# Patient Record
Sex: Female | Born: 1970 | Race: White | Hispanic: No | Marital: Married | Smoking: Never smoker
Health system: Southern US, Community
[De-identification: ages and names within clinical notes are randomized; demographics above are authoritative.]

## PROBLEM LIST (undated history)

## (undated) DIAGNOSIS — L309 Dermatitis, unspecified: Secondary | ICD-10-CM

## (undated) DIAGNOSIS — L719 Rosacea, unspecified: Secondary | ICD-10-CM

## (undated) DIAGNOSIS — F419 Anxiety disorder, unspecified: Secondary | ICD-10-CM

## (undated) DIAGNOSIS — U071 COVID-19: Secondary | ICD-10-CM

## (undated) DIAGNOSIS — E669 Obesity, unspecified: Secondary | ICD-10-CM

## (undated) DIAGNOSIS — J3089 Other allergic rhinitis: Secondary | ICD-10-CM

## (undated) DIAGNOSIS — N83201 Unspecified ovarian cyst, right side: Secondary | ICD-10-CM

## (undated) DIAGNOSIS — M1990 Unspecified osteoarthritis, unspecified site: Secondary | ICD-10-CM

## (undated) DIAGNOSIS — M549 Dorsalgia, unspecified: Secondary | ICD-10-CM

## (undated) DIAGNOSIS — R5383 Other fatigue: Secondary | ICD-10-CM

## (undated) DIAGNOSIS — E7800 Pure hypercholesterolemia, unspecified: Secondary | ICD-10-CM

## (undated) DIAGNOSIS — S335XXA Sprain of ligaments of lumbar spine, initial encounter: Secondary | ICD-10-CM

## (undated) DIAGNOSIS — Z8632 Personal history of gestational diabetes: Secondary | ICD-10-CM

## (undated) DIAGNOSIS — Z86718 Personal history of other venous thrombosis and embolism: Secondary | ICD-10-CM

## (undated) DIAGNOSIS — T7840XA Allergy, unspecified, initial encounter: Secondary | ICD-10-CM

## (undated) DIAGNOSIS — E119 Type 2 diabetes mellitus without complications: Secondary | ICD-10-CM

## (undated) DIAGNOSIS — R0602 Shortness of breath: Secondary | ICD-10-CM

## (undated) DIAGNOSIS — J189 Pneumonia, unspecified organism: Secondary | ICD-10-CM

## (undated) DIAGNOSIS — M2550 Pain in unspecified joint: Secondary | ICD-10-CM

## (undated) HISTORY — DX: Pure hypercholesterolemia, unspecified: E78.00

## (undated) HISTORY — DX: Dermatitis, unspecified: L30.9

## (undated) HISTORY — DX: Anxiety disorder, unspecified: F41.9

## (undated) HISTORY — DX: Rosacea, unspecified: L71.9

## (undated) HISTORY — DX: Pneumonia, unspecified organism: J18.9

## (undated) HISTORY — DX: Type 2 diabetes mellitus without complications: E11.9

## (undated) HISTORY — DX: Unspecified osteoarthritis, unspecified site: M19.90

## (undated) HISTORY — DX: Allergy, unspecified, initial encounter: T78.40XA

## (undated) HISTORY — DX: Obesity, unspecified: E66.9

## (undated) HISTORY — DX: Personal history of other venous thrombosis and embolism: Z86.718

## (undated) HISTORY — DX: Other fatigue: R53.83

## (undated) HISTORY — DX: COVID-19: U07.1

## (undated) HISTORY — DX: Pain in unspecified joint: M25.50

## (undated) HISTORY — DX: Shortness of breath: R06.02

## (undated) HISTORY — DX: Dorsalgia, unspecified: M54.9

## (undated) HISTORY — DX: Sprain of ligaments of lumbar spine, initial encounter: S33.5XXA

---

## 1999-08-09 HISTORY — PX: WISDOM TOOTH EXTRACTION: SHX21

## 2001-10-03 ENCOUNTER — Other Ambulatory Visit: Admission: RE | Admit: 2001-10-03 | Discharge: 2001-10-03 | Payer: Self-pay | Admitting: Obstetrics and Gynecology

## 2002-10-14 ENCOUNTER — Other Ambulatory Visit: Admission: RE | Admit: 2002-10-14 | Discharge: 2002-10-14 | Payer: Self-pay | Admitting: Gynecology

## 2003-12-08 ENCOUNTER — Other Ambulatory Visit: Admission: RE | Admit: 2003-12-08 | Discharge: 2003-12-08 | Payer: Self-pay | Admitting: Obstetrics and Gynecology

## 2004-08-27 ENCOUNTER — Other Ambulatory Visit: Admission: RE | Admit: 2004-08-27 | Discharge: 2004-08-27 | Payer: Self-pay | Admitting: Gynecology

## 2004-12-21 ENCOUNTER — Encounter: Admission: RE | Admit: 2004-12-21 | Discharge: 2004-12-21 | Payer: Self-pay | Admitting: Gynecology

## 2005-03-15 ENCOUNTER — Encounter (INDEPENDENT_AMBULATORY_CARE_PROVIDER_SITE_OTHER): Payer: Self-pay | Admitting: Specialist

## 2005-03-15 ENCOUNTER — Inpatient Hospital Stay (HOSPITAL_COMMUNITY): Admission: AD | Admit: 2005-03-15 | Discharge: 2005-03-18 | Payer: Self-pay | Admitting: Gynecology

## 2005-04-06 ENCOUNTER — Ambulatory Visit: Admission: RE | Admit: 2005-04-06 | Discharge: 2005-04-06 | Payer: Self-pay | Admitting: Gynecology

## 2005-04-26 ENCOUNTER — Other Ambulatory Visit: Admission: RE | Admit: 2005-04-26 | Discharge: 2005-04-26 | Payer: Self-pay | Admitting: Gynecology

## 2005-08-04 ENCOUNTER — Ambulatory Visit: Payer: Self-pay | Admitting: Internal Medicine

## 2006-05-03 ENCOUNTER — Other Ambulatory Visit: Admission: RE | Admit: 2006-05-03 | Discharge: 2006-05-03 | Payer: Self-pay | Admitting: Gynecology

## 2006-07-06 ENCOUNTER — Ambulatory Visit: Payer: Self-pay | Admitting: Internal Medicine

## 2006-08-05 ENCOUNTER — Ambulatory Visit: Payer: Self-pay | Admitting: Internal Medicine

## 2007-06-20 ENCOUNTER — Other Ambulatory Visit: Admission: RE | Admit: 2007-06-20 | Discharge: 2007-06-20 | Payer: Self-pay | Admitting: Gynecology

## 2007-06-26 ENCOUNTER — Telehealth: Payer: Self-pay | Admitting: Internal Medicine

## 2007-07-09 ENCOUNTER — Telehealth (INDEPENDENT_AMBULATORY_CARE_PROVIDER_SITE_OTHER): Payer: Self-pay | Admitting: *Deleted

## 2007-12-01 ENCOUNTER — Encounter: Payer: Self-pay | Admitting: *Deleted

## 2007-12-01 DIAGNOSIS — N751 Abscess of Bartholin's gland: Secondary | ICD-10-CM | POA: Insufficient documentation

## 2008-05-19 ENCOUNTER — Inpatient Hospital Stay (HOSPITAL_COMMUNITY): Admission: AD | Admit: 2008-05-19 | Discharge: 2008-05-21 | Payer: Self-pay | Admitting: Obstetrics and Gynecology

## 2008-05-20 ENCOUNTER — Encounter (INDEPENDENT_AMBULATORY_CARE_PROVIDER_SITE_OTHER): Payer: Self-pay | Admitting: Obstetrics and Gynecology

## 2009-07-13 ENCOUNTER — Ambulatory Visit: Payer: Self-pay | Admitting: Gynecology

## 2009-07-13 ENCOUNTER — Other Ambulatory Visit: Admission: RE | Admit: 2009-07-13 | Discharge: 2009-07-13 | Payer: Self-pay | Admitting: Gynecology

## 2009-08-12 ENCOUNTER — Ambulatory Visit: Payer: Self-pay | Admitting: Gynecology

## 2009-08-20 ENCOUNTER — Ambulatory Visit: Payer: Self-pay | Admitting: Internal Medicine

## 2009-08-20 DIAGNOSIS — E785 Hyperlipidemia, unspecified: Secondary | ICD-10-CM | POA: Insufficient documentation

## 2009-08-21 ENCOUNTER — Telehealth (INDEPENDENT_AMBULATORY_CARE_PROVIDER_SITE_OTHER): Payer: Self-pay | Admitting: *Deleted

## 2009-12-05 ENCOUNTER — Ambulatory Visit: Payer: Self-pay | Admitting: Family Medicine

## 2009-12-05 DIAGNOSIS — S335XXA Sprain of ligaments of lumbar spine, initial encounter: Secondary | ICD-10-CM

## 2009-12-05 HISTORY — DX: Sprain of ligaments of lumbar spine, initial encounter: S33.5XXA

## 2010-07-14 ENCOUNTER — Ambulatory Visit: Payer: Self-pay | Admitting: Women's Health

## 2010-07-14 ENCOUNTER — Other Ambulatory Visit
Admission: RE | Admit: 2010-07-14 | Discharge: 2010-07-14 | Payer: Self-pay | Source: Home / Self Care | Admitting: Gynecology

## 2010-09-07 NOTE — Assessment & Plan Note (Signed)
Summary: BACK PAIN/DLO   Vital Signs:  Patient profile:   40 year old female Weight:      171 pounds Temp:     97.9 degrees F oral Pulse rate:   67 / minute BP sitting:   118 / 70  (right arm) Cuff size:   regular  Vitals Entered By: Lamar Sprinkles, CMA (December 05, 2009 9:01 AM) CC: Pt c/o low back pain starting last night after bending. Has taken naproxyn this am.    History of Present Illness: Here with her husband for the sudden onset of low back pain last night. After doing yard work all day yesterday, she bent over to get her 29 year old child out of the bathtub and felt a sudden sharp pain across both sides of the lower back. this has persisted this am, along with stifness over the entire back. No radiation of pain in the legs or numbness or weakness. Using Naproxen. This has never happened before.  Current Medications (verified): 1)  Multivitamins   Tabs (Multiple Vitamin) .... Take Once Daily 2)  Betamethasone Dipropionate 0.05 % Crea (Betamethasone Dipropionate) .... Apply Once Daily To Eczema When Flare  Allergies (verified): No Known Drug Allergies  Past History:  Past Medical History: Reviewed history from 08/20/2009 and no changes required. Hx of BARTHOLIN'S CYST ABSCESS (ICD-616.3) Hx of PLANTAR FASCIITIS, LEFT (ICD-728.71) ECZEMA (ICD-692.9) CHICKENPOX, HX OF (ICD-V15.9)   Physician Roster            gyn - Dr. Audie Box            opthal - [facility]  Past Surgical History: Reviewed history from 08/20/2009 and no changes required. WISDOM TEETH EXTRACTION, HX OF (ICD-V15.9) Caesarean section '06   G2P2 - 1st by c-section, 2nd - SVD  Review of Systems  The patient denies anorexia, fever, weight loss, weight gain, vision loss, decreased hearing, hoarseness, chest pain, syncope, dyspnea on exertion, peripheral edema, prolonged cough, headaches, hemoptysis, abdominal pain, melena, hematochezia, severe indigestion/heartburn, hematuria, incontinence, genital  sores, muscle weakness, suspicious skin lesions, transient blindness, difficulty walking, depression, unusual weight change, abnormal bleeding, enlarged lymph nodes, angioedema, breast masses, and testicular masses.    Physical Exam  General:  in pain but able to get on the exam table  easily Msk:  tender in both side sof the lower back with some spasm, but full ROM and negative SLR   Impression & Recommendations:  Problem # 1:  LUMBAR SPRAIN AND STRAIN (ICD-847.2)  Complete Medication List: 1)  Multivitamins Tabs (Multiple vitamin) .... Take once daily 2)  Betamethasone Dipropionate 0.05 % Crea (Betamethasone dipropionate) .... Apply once daily to eczema when flare 3)  Flexeril 10 Mg Tabs (Cyclobenzaprine hcl) .... Three times a day as needed spasm 4)  Vicodin 5-500 Mg Tabs (Hydrocodone-acetaminophen) .Marland Kitchen.. 1 q 4 hours as needed pain  Patient Instructions: 1)  rest, ice packs. Continue naproxen, add Vicodin and flexeril as needed .  2)  Please schedule a follow-up appointment as needed .  Prescriptions: VICODIN 5-500 MG TABS (HYDROCODONE-ACETAMINOPHEN) 1 q 4 hours as needed pain  #60 x 0   Entered and Authorized by:   Nelwyn Salisbury MD   Signed by:   Nelwyn Salisbury MD on 12/05/2009   Method used:   Print then Give to Patient   RxID:   [id number] FLEXERIL 10 MG TABS (CYCLOBENZAPRINE HCL) three times a day as needed spasm  #60 x 2   Entered and Authorized by:  Nelwyn Salisbury MD   Signed by:   Nelwyn Salisbury MD on 12/05/2009   Method used:   Print then Give to Patient   RxID:   [id number]

## 2010-09-07 NOTE — Assessment & Plan Note (Signed)
Summary: RE ESTABLISH VISIT--[employer]--[facility]   Vital Signs:  Patient profile:   40 year old female Height:      66 inches Weight:      174 pounds BMI:     28.19 O2 Sat:      97 % on Room air Temp:     98.4 degrees F oral Pulse rate:   56 / minute BP sitting:   138 / 52  (left arm) Cuff size:   regular  Vitals Entered By: Bill Salinas CMA (August 20, 2009 1:23 PM)  O2 Flow:  Room air CC: office visit to re establish care with Dr Norins/ pt is due for her tetanus booster/ ab   Primary Care Provider:  Jacques Navy MD  CC:  office visit to re establish care with Dr Norins/ pt is due for her tetanus booster/ ab.  History of Present Illness: Patient presents to reestablish for on-going continuity care. she has had two children and is now done. she is feeling well but has had her lipids checked with an LDL of 140 non-fasting and subsequently (after the holidays) 167 with healthy HDLs. she does have a positive family history of hyperlipidemia. Her plan is the LSM approach. She is provided reference to [employer]. gov and NCEP guidelines.   Derm issues: has many skin tags, some of which get irritated; she gets an eczema of the hands/palms with weather, dryness, etc. She has a collection of  moles. Small keratin papules bridge of nose and medial aspect nose by the canthus right.   Current with gynecologist.   Current Medications (verified): 1)  Multivitamins   Tabs (Multiple Vitamin) .... Take Once Daily  Allergies (verified): No Known Drug Allergies  Past History:  Family History: Last updated: 08/20/2009 Father - 1944: CAD/MI @ 82,  CABG 4 vessel;h/o V-tach - AICD; Lipids, HTN Mother - 1944: Lipids Neg- breast or colon cancer; DM  Social History: Last updated: 08/20/2009 [employer] - BA; [employer] school Married - '03 2 sons - '06, '09 Homemaker marriage in good health   Risk Factors: Smoking Status: never (12/01/2007)  Past Medical History: Hx of  BARTHOLIN'S CYST ABSCESS (ICD-616.3) Hx of PLANTAR FASCIITIS, LEFT (ICD-728.71) ECZEMA (ICD-692.9) CHICKENPOX, HX OF (ICD-V15.9)   Physician Roster            gyn - Dr. Audie Box            opthal - [facility]  Past Surgical History: WISDOM TEETH EXTRACTION, HX OF (ICD-V15.9) Caesarean section '06   G2P2 - 1st by c-section, 2nd - SVD  Family History: Father - 1944: CAD/MI @ 16,  CABG 4 vessel;h/o V-tach - AICD; Lipids, HTN Mother - 1944: Lipids Neg- breast or colon cancer; DM  Social History: [employer] - BA; [employer] school Married - '03 2 sons - '06, '09 Homemaker marriage in good health   Review of Systems  The patient denies anorexia, fever, weight loss, weight gain, vision loss, decreased hearing, hoarseness, chest pain, syncope, dyspnea on exertion, peripheral edema, prolonged cough, headaches, hemoptysis, abdominal pain, melena, hematochezia, severe indigestion/heartburn, hematuria, incontinence, muscle weakness, suspicious skin lesions, transient blindness, difficulty walking, depression, unusual weight change, enlarged lymph nodes, angioedema, and breast masses.    Physical Exam  General:  Well-developed,well-nourished,in no acute distress; alert,appropriate and cooperative throughout examination Head:  Normocephalic and atraumatic without obvious abnormalities. No apparent alopecia or balding. Eyes:  No corneal or conjunctival inflammation noted. EOMI. Perrla. Funduscopic exam benign, without hemorrhages,  exudates or papilledema. Vision grossly normal. Ears:  Right EAC normal , TM normal; left EAC with cerumen Mouth:  Oral mucosa and oropharynx without lesions or exudates.  Teeth in good repair. Neck:  No deformities, masses, or tenderness noted.no thyromegaly and no carotid bruits.   Chest Wall:  no deformities and no tenderness.   Breasts:  deferred to gyn Lungs:  Normal respiratory effort, chest expands symmetrically. Lungs are clear to  auscultation, no crackles or wheezes. Heart:  Normal rate and regular rhythm. S1 and S2 normal without gallop, murmur, click, rub or other extra sounds. Abdomen:  soft, non-tender, and normal bowel sounds.   Genitalia:  deferred to gyn Msk:  normal ROM, no joint tenderness, no joint swelling, and no joint warmth.   Pulses:  2+ radial Extremities:  No clubbing, cyanosis, edema, or deformity noted with normal full range of motion of all joints.   Neurologic:  No cranial nerve deficits noted. Station and gait are normal. Plantar reflexes are down-going bilaterally. DTRs are symmetrical throughout. Sensory, motor and coordinative functions appear intact. Skin:  turgor normal and color normal.  Minor eczema on palm of left hand. several skin tags around the neck. Benign appearing mole mid back. Cervical Nodes:  no anterior cervical adenopathy and no posterior cervical adenopathy.   Psych:  Oriented X3, memory intact for recent and remote, normally interactive, and good eye contact.     Impression & Recommendations:  Problem # 1:  ECZEMA (ICD-692.9) Chronic recurrent intermittent problem  Plan - betamethasone as needed.  Her updated medication list for this problem includes:    Betamethasone Dipropionate 0.05 % Crea (Betamethasone dipropionate) .Marland Kitchen... Apply once daily to eczema when flare  Problem # 2:  SKIN TAG (ICD-701.9) Several minor skin tags, including in a necklace location.  Plan - removal of skin tags at her convenience.  Problem # 3:  HYPERLIPIDEMIA (ICD-272.4) Outside lab: LDL 167, HDL ok.  Plan - life-style management with low fat diet and exercise           Lipid profile in 4 months - goal of LDL 130 or less.  Problem # 4:  Preventive Health Care (ICD-V70.0) Normal history, normal limited physical exam. Outside labs with elevated cholesterol as noted.   In summary - a very nice woman who appears to be in good health except for elevated choleterol which she is addressing.  She will return for lab as noted, otherwise in 18-25 months or as needed.   Complete Medication List: 1)  Multivitamins Tabs (Multiple vitamin) .... Take once daily 2)  Betamethasone Dipropionate 0.05 % Crea (Betamethasone dipropionate) .... Apply once daily to eczema when flare Prescriptions: BETAMETHASONE DIPROPIONATE 0.05 % CREA (BETAMETHASONE DIPROPIONATE) apply once daily to eczema when flare  #45g x 2   Entered and Authorized by:   Jacques Navy MD   Signed by:   Jacques Navy MD on 08/21/2009   Method used:   Handwritten   RxID:   [id number]

## 2010-09-07 NOTE — Progress Notes (Signed)
Summary: lab order in [facility] for May 2011.  ---- Converted from flag ---- ---- 08/21/2009 6:07 AM, Jacques Navy MD wrote: Peri Jefferson morning,  Please - lipid panel in 4 months - 272.4  Thanks ------------------------------

## 2010-12-24 NOTE — Discharge Summary (Signed)
NAMEMELI, FALEY                ACCOUNT NO.:  [id number]   MEDICAL RECORD NO.:  [id number]          PATIENT TYPE:  INP   LOCATION:  9125                          [facility]ACILITY:  [facility]   PHYSICIAN:  Timothy P. Fontaine, M.D.DATE OF BIRTH:  12/22/1970   DATE OF ADMISSION:  03/15/2005  DATE OF DISCHARGE:                                 DISCHARGE SUMMARY   DISCHARGE DIAGNOSES:  1.  Term pregnancy.  2.  Gestational diabetes, diet controlled.  3.  Labor.  4.  Breech presentation.   PROCEDURE:  Primary low transverse cervical cesarean section - March 15, 2005.   HOSPITAL COURSE:  A 40 year old G1 P0 female who presents at term in labor.  Evaluation revealed she was breech presentation and after appropriate  counseling was taken for a primary low transverse cervical cesarean section  in which she produced a normal-appearing female infant, Apgars 8 and 9 in the  frank breech presentation, weight 9 pounds 13 ounces. The patient's  postoperative course was uncomplicated. She was discharged on postoperative  day #3 ambulating well, tolerating a regular diet, with a postoperative  hemoglobin of 12.4. She received precautions, instructions, and follow-up,  will be seen in the office in 6 weeks. Received a prescription for Percocet  #40 one to two p.o. q.6h. p.r.n. pain with a maternal blood type of B  positive and a rubella titer that is positive.      Timothy P. Fontaine, M.D.  Electronically Signed     TPF/MEDQ  D:  03/18/2005  T:  03/18/2005  Job:  [id number]

## 2010-12-24 NOTE — Op Note (Signed)
Sherry Marquez, Sherry Marquez                ACCOUNT NO.:  [id number]   MEDICAL RECORD NO.:  [id number]          PATIENT TYPE:  INP   LOCATION:  9199                          [facility]ILITY:  [facility]   PHYSICIAN:  Timothy P. Fontaine, M.D.DATE OF BIRTH:  04-20-1971   DATE OF PROCEDURE:  03/15/2005  DATE OF DISCHARGE:                                 OPERATIVE REPORT   PREOPERATIVE DIAGNOSES:  1.  Term pregnancy.  2.  Labor.  3.  Gestational diabetes, diet-controlled.  4.  Breech presentation.   POSTOPERATIVE DIAGNOSES:  1.  Term pregnancy.  2.  Labor.  3.  Gestational diabetes, diet-controlled.  4.  Breech presentation.   PROCEDURE:  Primary low transverse cervical cesarean section.   SURGEON:  Timothy P. Fontaine, M.D.   ASSISTANT:  Scrub technician.   ANESTHETIC:  Spinal.   ESTIMATED BLOOD LOSS:  Less than 500 mL.   COMPLICATIONS:  None.   SPECIMEN:  Samples of cord blood, placenta, umbilical cord.   FINDINGS:  At 48, normal female infant, frank breech presentation, Apgars 8  and 9, weight 9 pounds 13 ounces.  Pelvic anatomy noted to be normal.   PROCEDURE:  The patient was taken to the operating room, underwent spinal  anesthesia, was placed in the left tilt supine position, received an  abdominal preparation with Betadine solution, the bladder emptied with an  indwelling Foley catheterization per nursing personnel, and the patient was  draped in usual fashion.  After assuring adequate anesthesia, the abdomen sharply entered through a  primary Pfannenstiel incision achieving adequate hemostasis at all levels.  The bladder flap was sharply and bluntly developed without difficulty.  The  uterus sharply entered in the lower uterine segment and bluntly extended  laterally.  The membranes were ruptured, the fluid noted to be clear.  The  infant was found in the frank breech presentation.  The patient underwent a  direct breech delivery without difficulty.  The nares and mouth were  suctioned, the cord doubly clamped and cut and the infant was handed to  pediatrics in attendance.  Samples of cord blood were obtained.  Placenta  was extruded, noted to be intact, and was sent to pathology.  The  endometrial cavity was explored with a sponge to remove all placental and  membrane fragments.  The patient received 1 g Ancef antibiotic prophylaxis  at this time.  The uterine incision was then closed in two layers using 0 Vicryl suture  first in a running interlocking stitch, followed by an imbricating stitch.  The uterus was returned to the abdomen, which was copiously irrigated.  Adequate hemostasis was visualized and the anterior fascia was  reapproximated using 0 Vicryl suture in a running stitch.  Subcutaneous  tissues were irrigated.  Adequate hemostasis achieved with electrocautery.  Skin reapproximated using 4-0 Vicryl in a running subcuticular stitch.  Benzoin and Steri-Strips applied, sterile dressing applied, and the patient  was taken to the recovery room in good condition, having tolerated procedure  well.       TPF/MEDQ  D:  03/15/2005  T:  03/16/2005  Job:  [id number] 

## 2010-12-24 NOTE — H&P (Signed)
NAME:  Sherry Marquez, Sherry Marquez NO.:  [id number]   MEDICAL RECORD NO.:  [id number]          PATIENT TYPE:  MAT   LOCATION:  MATC                          [facility]ILITY:  [facility]   PHYSICIAN:  Juan H. Lily Peer, M.D.DATE OF BIRTH:  12-29-1970   DATE OF ADMISSION:  03/15/2005  DATE OF DISCHARGE:                                HISTORY & PHYSICAL   CHIEF COMPLAINT:  Contractions.   HISTORY:  The patient is a 40 year old gravida 1 para 0 with an estimated  date of confinement of March 16, 2005 currently 39 and six-sevenths weeks  gestation. Presented to [facility] complaining of contractions every 5  minutes which started at 9 p.m. last night. The patient denies any rupture  of membranes. She was placed on the monitor and was found to be contracting  every 3-4 minutes apart with a reassuring fetal heart rate tracing and the  patient with stable vital signs and afebrile. Review of prenatal record  indicated that she had gestational diabetes which was diet controlled. Due  to a family member on the husband's side with alpha thalassemia minor, they  were offered genetic testing but the patient and husband said they would not  terminate under any circumstances and denied any further testing, although  her CBC had been normal on admission. The remainder of prenatal course had  been essentially unremarkable. She was followed with nonstress tests weekly  due to her gestational diabetes and maintained good diet control of blood  sugars. The patient was found to have positive group B strep on cultures  done at 35-[redacted] weeks gestation.   PAST MEDICAL HISTORY:  The patient denies any allergies. She denies smoking  or alcohol consumption.   REVIEW OF SYSTEMS:  See [place] form.   PHYSICAL EXAMINATION:  GENERAL:  Well-developed, well-nourished female.  HEENT:  Unremarkable.  NECK:  Supple, trachea midline. No carotid bruits, no thyromegaly.  LUNGS:  Clear to auscultation without rhonchi  or wheezes.  HEART:  Regular rate and rhythm. No murmurs or gallops.  BREAST:  Exam not done.  ABDOMEN:  Soft, nontender, without rebound or guarding.  PELVIC:  Bartholin, urethra, Skene glands within normal limits. Cervix 3-4  cm dilated, 80% effaced, -3 station, bloody show.  EXTREMITIES:  DTRs 1+, negative clonus.   PRENATAL LABORATORY DATA:  B positive blood type, negative antibody screen.  VDRL was nonreactive. Rubella immune. Hepatitis B surface antigen and HIV  were negative. Alpha-fetoprotein was normal. Blood sugar screen was  abnormal. GBS culture positive.   ASSESSMENT:  A 40 year old gravida 1 para 0 at 79 and six-sevenths weeks  gestation in labor with advanced cervical dilatation, reassuring fetal heart  rate tracing. The patient with positive group B streptococcus. The patient  will receive penicillin G 5,000,000 units IV followed by 2,500,000 units IV  q.4h. and will augment with Pitocin as  indicated in the event of protracted labor. The patient not interested in  pain relief medication at the present time, but options have been presented  to her.   PLAN:  As per assessment above.  JHF/MEDQ  D:  03/15/2005  T:  03/15/2005  Job:  [id number]

## 2011-05-09 LAB — CBC
Hemoglobin: 10.2 — ABNORMAL LOW
MCV: 93.6
Platelets: 167
RDW: 13.6
WBC: 10.6 — ABNORMAL HIGH
WBC: 13.8 — ABNORMAL HIGH

## 2011-05-09 LAB — RPR: RPR Ser Ql: NONREACTIVE

## 2011-07-21 ENCOUNTER — Ambulatory Visit (INDEPENDENT_AMBULATORY_CARE_PROVIDER_SITE_OTHER): Payer: BC Managed Care – PPO | Admitting: Women's Health

## 2011-07-21 ENCOUNTER — Other Ambulatory Visit (HOSPITAL_COMMUNITY)
Admission: RE | Admit: 2011-07-21 | Discharge: 2011-07-21 | Disposition: A | Discharge status: Home / Self Care | Payer: BC Managed Care – PPO | Source: Ambulatory Visit | Attending: Women's Health | Admitting: Women's Health

## 2011-07-21 ENCOUNTER — Encounter: Payer: Self-pay | Admitting: Women's Health

## 2011-07-21 VITALS — BP 118/72 | Ht 65.5 in | Wt 180.0 lb

## 2011-07-21 DIAGNOSIS — Z833 Family history of diabetes mellitus: Secondary | ICD-10-CM

## 2011-07-21 DIAGNOSIS — E079 Disorder of thyroid, unspecified: Secondary | ICD-10-CM

## 2011-07-21 DIAGNOSIS — Z01419 Encounter for gynecological examination (general) (routine) without abnormal findings: Secondary | ICD-10-CM | POA: Insufficient documentation

## 2011-07-21 DIAGNOSIS — Z13220 Encounter for screening for lipoid disorders: Secondary | ICD-10-CM

## 2011-07-21 DIAGNOSIS — Z1159 Encounter for screening for other viral diseases: Secondary | ICD-10-CM | POA: Insufficient documentation

## 2011-07-21 NOTE — Progress Notes (Signed)
Sherry Marquez Mar 23, 1971 [id number]    History:    The patient presents for annual exam.  Monthly 5 day cycle/vasectomy. Complaint of weight gain, losing hair, dry skin and mild anxiety/ depression. History of normal Paps.   Past medical history, past surgical history, family history and social history were all reviewed and documented in the EPIC chart. History of GDM.   ROS:  A  ROS was performed and pertinent positives and negatives are included in the history.  Exam:  Filed Vitals:   07/21/11 0939  BP: 118/72    General appearance:  Normal Head/Neck:  Normal, without cervical or supraclavicular adenopathy. Thyroid:  Symmetrical, normal in size, without palpable masses or nodularity. Respiratory  Effort:  Normal  Auscultation:  Clear without wheezing or rhonchi Cardiovascular  Auscultation:  Regular rate, without rubs, murmurs or gallops  Edema/varicosities:  Not grossly evident Abdominal  Soft,nontender, without masses, guarding or rebound.  Liver/spleen:  No organomegaly noted  Hernia:  None appreciated  Skin  Inspection:  Grossly normal  Palpation:  Grossly normal Neurologic/psychiatric  Orientation:  Normal with appropriate conversation.  Mood/affect:  Normal  Genitourinary    Breasts: Examined lying and sitting.     Right: Without masses, retractions, discharge or axillary adenopathy.     Left: Without masses, retractions, discharge or axillary adenopathy.   Inguinal/mons:  Normal without inguinal adenopathy  External genitalia:  Normal  BUS/Urethra/Skene's glands:  Normal  Bladder:  Normal  Vagina:  Normal  Cervix:  Normal  Uterus:   normal in size, shape and contour.  Midline and mobile  Adnexa/parametria:     Rt: Without masses or tenderness.   Lt: Without masses or tenderness.  Anus and perineum: Normal  Digital rectal exam: Normal sphincter tone without palpated masses or tenderness  Assessment/Plan:  40 y.o. MWF G2 P2 for annual exam. Appears  well although has numerous complaints in regards to weight, hair, skin and mood.   Normal GYN exam  Plan: CBC, TSH, glucose, UA and Pap. SBEs, baseline mammogram this year and annual  screening at 40. Encouraged increase exercise, cutting calories for weight loss, and reviewed weight difference is only 5 pounds from last year. Continue multivitamin daily, increase leisure. Declines need for counseling or medication.     Shyhiem Beeney J WHNP, 1:32 PM 07/21/2011

## 2011-07-29 ENCOUNTER — Telehealth: Payer: Self-pay | Admitting: *Deleted

## 2011-08-03 NOTE — Telephone Encounter (Signed)
Wrong chart

## 2011-10-25 ENCOUNTER — Encounter: Payer: Self-pay | Admitting: Endocrinology

## 2011-10-25 ENCOUNTER — Ambulatory Visit (INDEPENDENT_AMBULATORY_CARE_PROVIDER_SITE_OTHER): Payer: BC Managed Care – PPO | Admitting: Endocrinology

## 2011-10-25 VITALS — BP 122/76 | HR 66 | Temp 98.3°F

## 2011-10-25 DIAGNOSIS — J069 Acute upper respiratory infection, unspecified: Secondary | ICD-10-CM

## 2011-10-25 MED ORDER — CEFUROXIME AXETIL 250 MG PO TABS: 250.0000 mg | ORAL_TABLET | Freq: Two times a day (BID) | ORAL | Status: AC

## 2011-10-25 NOTE — Patient Instructions (Signed)
i have sent a prescription to your pharmacy, for an antibiotic I hope you feel better soon.  If you don't feel better by next week, please call dr Debby Bud.

## 2011-10-25 NOTE — Progress Notes (Signed)
  Subjective:    Patient ID: Sherry Marquez, female    DOB: 10-Jan-1971, 41 y.o.   MRN: [id number]  HPI Pt states 1 day of slight pain at the throat, and assoc chills.  2 children are ill also.  Past Medical History  Diagnosis Date  . IUD     HISTORY OF MIRENA 01/25/06 AND REMOVED IN 2008  . Eczema     Past Surgical History  Procedure Date  . Wisdom tooth extraction 2001  . Cesarean section     History   Social History  . Marital Status: Married    Spouse Name: N/A    Number of Children: N/A  . Years of Education: N/A   Occupational History  . Not on file.   Social History Main Topics  . Smoking status: Never Smoker   . Smokeless tobacco: Never Used  . Alcohol Use: Yes     RARELY  . Drug Use: No  . Sexually Active: Yes    Birth Control/ Protection: Other-see comments     SPOUSE VASECTOMY   Other Topics Concern  . Not on file   Social History Narrative  . No narrative on file    Current Outpatient Prescriptions on File Prior to Visit  Medication Sig Dispense Refill  . Ascorbic Acid (VITAMIN C PO) Take by mouth.          No Known Allergies  Family History  Problem Relation Age of Onset  . Heart disease Father   . Hypertension Father   . Hypertension Brother     BP 122/76  Pulse 66  Temp(Src) 98.3 F (36.8 C) (Oral)  SpO2 99%    Review of Systems She has myalgias and headache    Objective:   Physical Exam VITAL SIGNS:  See vs page GENERAL: no distress head: no deformity eyes: no periorbital swelling, no proptosis external nose and ears are normal mouth: no lesion seen, but pharynx is red and slightly swollen Right tm is red.  Left is normal NECK: There is no palpable thyroid enlargement.  No thyroid nodule is palpable.  No palpable lymphadenopathy at the anterior neck. LUNGS:  Clear to auscultation        Assessment & Plan:  Glenford Peers, new

## 2011-10-26 ENCOUNTER — Ambulatory Visit: Payer: BC Managed Care – PPO | Admitting: Internal Medicine

## 2011-10-31 ENCOUNTER — Telehealth: Payer: Self-pay | Admitting: *Deleted

## 2011-10-31 NOTE — Telephone Encounter (Signed)
Patient called to get number of her last TSH.  Gave patient results.

## 2011-11-07 ENCOUNTER — Encounter: Payer: Self-pay | Admitting: Internal Medicine

## 2011-11-07 ENCOUNTER — Ambulatory Visit (INDEPENDENT_AMBULATORY_CARE_PROVIDER_SITE_OTHER): Payer: BC Managed Care – PPO | Admitting: Internal Medicine

## 2011-11-07 VITALS — BP 138/86 | HR 67 | Temp 98.1°F | Resp 14 | Wt 182.0 lb

## 2011-11-07 DIAGNOSIS — J069 Acute upper respiratory infection, unspecified: Secondary | ICD-10-CM

## 2011-11-07 NOTE — Patient Instructions (Signed)
Pharyngitis - really looks like this may be a viral infection, especially since you did not get full clearance with ceftin, a powerful antibiotic.  Plan - gargle of choice, either advil or tylenol for fever and aches, for ear pressure sudafed 30 mg 2 or 3 times a day, ecchinacea in form of choice (doctor's choice Knudsen family juice with ecchinacea).  Pharyngitis, Viral and Bacterial Pharyngitis is soreness (inflammation) or infection of the pharynx. It is also called a sore throat. CAUSES   Most sore throats are caused by viruses and are part of a cold. However, some sore throats are caused by strep and other bacteria. Sore throats can also be caused by post nasal drip from draining sinuses, allergies and sometimes from sleeping with an open mouth. Infectious sore throats can be spread from person to person by coughing, sneezing and sharing cups or eating utensils. TREATMENT   Sore throats that are viral usually last 3-4 days. Viral illness will get better without medications (antibiotics). Strep throat and other bacterial infections will usually begin to get better about 24-48 hours after you begin to take antibiotics. HOME CARE INSTRUCTIONS    If the caregiver feels there is a bacterial infection or if there is a positive strep test, they will prescribe an antibiotic. The full course of antibiotics must be taken. If the full course of antibiotic is not taken, you or your child may become ill again. If you or your child has strep throat and do not finish all of the medication, serious heart or kidney diseases may develop.   Drink enough water and fluids to keep your urine clear or pale yellow.   Only take over-the-counter or prescription medicines for pain, discomfort or fever as directed by your caregiver.   Get lots of rest.   Gargle with salt water ( tsp. of salt in a glass of water) as often as every 1-2 hours as you need for comfort.   Hard candies may soothe the throat if individual  is not at risk for choking. Throat sprays or lozenges may also be used.  SEEK MEDICAL CARE IF:    Large, tender lumps in the neck develop.   A rash develops.   Green, yellow-brown or bloody sputum is coughed up.   Your baby is older than 3 months with a rectal temperature of 100.5 F (38.1 C) or higher for more than 1 day.  SEEK IMMEDIATE MEDICAL CARE IF:    A stiff neck develops.   You or your child are drooling or unable to swallow liquids.   You or your child are vomiting, unable to keep medications or liquids down.   You or your child has severe pain, unrelieved with recommended medications.   You or your child are having difficulty breathing (not due to stuffy nose).   You or your child are unable to fully open your mouth.   You or your child develop redness, swelling, or severe pain anywhere on the neck.   You have a fever.   Your baby is older than 3 months with a rectal temperature of 102 F (38.9 C) or higher.   Your baby is 62 months old or younger with a rectal temperature of 100.4 F (38 C) or higher.  MAKE SURE YOU:    Understand these instructions.   Will watch your condition.   Will get help right away if you are not doing well or get worse.  Document Released: 07/25/2005 Document Revised: 07/14/2011 Document Reviewed: 10/22/2007  ExitCare Patient Information 2012 [place].

## 2011-11-08 NOTE — Progress Notes (Signed)
  Subjective:    Patient ID: Sherry Marquez, female    DOB: May 24, 1971, 41 y.o.   MRN: [id number]  HPI Sherry Marquez was seen two weeks ago by Dr. Sherrilyn Marquez and was diagnosed with URI for which she was prescribed Ceftin. Patient reports that her symptoms never did clear completely. Today she has sore throat, pressure in the ears and some facial pressure. She has had no documented fever. She denies any respiratory symptoms: productive cough, SOB, DOE. She has had no N/V/D, moderate odynophagia but has been able to eat and drink.  PMH, FamHx and SocHx reviewed for any changes and relevance.    Review of Systems System review is negative for any constitutional, cardiac, pulmonary, GI or neuro symptoms or complaints other than as described in the HPI.     Objective:   Physical Exam Filed Vitals:   11/07/11 1539  BP: 138/86  Pulse: 67  Temp: 98.1 F (36.7 C)  Resp: 14   Gen'l- WNWD woman in no distress HEENT- TMs normal with slight bulge; posterior pharynx with mild cobblestone appearance, normal tonsils, w/o exudate on posterior wall Node - negative in the cervical and submandibular regions Pulm - CTAP Cor- RRR       Assessment & Plan:  Viral URI - no indication for additional antibiotics Plan - supportive care - see AVS

## 2012-07-09 ENCOUNTER — Ambulatory Visit (INDEPENDENT_AMBULATORY_CARE_PROVIDER_SITE_OTHER): Payer: BC Managed Care – PPO | Admitting: Internal Medicine

## 2012-07-09 ENCOUNTER — Ambulatory Visit: Payer: BC Managed Care – PPO | Admitting: Internal Medicine

## 2012-07-09 ENCOUNTER — Encounter: Payer: Self-pay | Admitting: Internal Medicine

## 2012-07-09 VITALS — BP 122/82 | HR 73 | Temp 99.7°F | Resp 16 | Ht 66.0 in | Wt 178.0 lb

## 2012-07-09 DIAGNOSIS — R059 Cough, unspecified: Secondary | ICD-10-CM

## 2012-07-09 DIAGNOSIS — R05 Cough: Secondary | ICD-10-CM

## 2012-07-09 DIAGNOSIS — J209 Acute bronchitis, unspecified: Secondary | ICD-10-CM

## 2012-07-09 MED ORDER — HYDROCODONE-HOMATROPINE 5-1.5 MG/5ML PO SYRP: 5.0000 mL | ORAL_SOLUTION | Freq: Three times a day (TID) | ORAL | Status: DC | PRN

## 2012-07-09 MED ORDER — AZITHROMYCIN 250 MG PO TABS: ORAL_TABLET | ORAL | Status: DC

## 2012-07-09 NOTE — Progress Notes (Signed)
HPI  Pt presents to the clinic today with c/o cough and fevers for 6 days. The cough only slightly productive of green sputum, but seems to be getting worse. She has taken Mucinex and tylenol with on ly little relief. The cough is worse at night when she is lying down. She denies any sick contacts but does have 2 small children at home.  Review of Systems    Past Medical History  Diagnosis Date  . IUD     HISTORY OF MIRENA 01/25/06 AND REMOVED IN 2008  . Eczema     Family History  Problem Relation Age of Onset  . Heart disease Father   . Hypertension Father   . Hypertension Brother     History   Social History  . Marital Status: Married    Spouse Name: N/A    Number of Children: N/A  . Years of Education: N/A   Occupational History  . Not on file.   Social History Main Topics  . Smoking status: Never Smoker   . Smokeless tobacco: Never Used  . Alcohol Use: Yes     Comment: RARELY  . Drug Use: No  . Sexually Active: Yes    Birth Control/ Protection: Other-see comments     Comment: SPOUSE VASECTOMY   Other Topics Concern  . Not on file   Social History Narrative  . No narrative on file    No Known Allergies   Constitutional: Positive fatigue and fever. Denies headache or abrupt weight changes.  HEENT:  Positive sore throat. Denies eye redness,  eye pain, pressure behind the eyes, facial pain, nasal congestion, ear pain, ringing in the ears, wax buildup, runny nose or bloody nose. Respiratory: Positive cough and thick green sputum production. Denies difficulty breathing or shortness of breath.  Cardiovascular: Denies chest pain, chest tightness, palpitations or swelling in the hands or feet.   No other specific complaints in a complete review of systems (except as listed in HPI above).  Objective:    General: Appears her stated age, well developed, well nourished in NAD. HEENT: Head: normal shape and size; Eyes: sclera white, no icterus, conjunctiva pink,  PERRLA and EOMs intact; Ears: Tm's gray and intact, normal light reflex; Nose: mucosa pink and moist, septum midline; Throat/Mouth: + PND. Teeth present, mucosa pink and moist, no exudate noted, no lesions or ulcerations noted.  Neck: Mild cervical lymphadenopathy. Neck supple, trachea midline. No massses, lumps or thyromegaly present.  Cardiovascular: Normal rate and rhythm. S1,S2 noted.  No murmur, rubs or gallops noted. No JVD or BLE edema. No carotid bruits noted. Pulmonary/Chest: Normal effort with bilateral fine rhonchi in the bases. No respiratory distress. No wheezes, rales or ronchi noted.      Assessment & Plan:   Acute bronchitis  Drink plenty of fluid and get some rest. Continue taking tylenol and Mucinex as needed eRx for Azithromycin x 5 days  RTC as needed or if symptoms persist.

## 2012-07-09 NOTE — Patient Instructions (Addendum)

## 2012-07-23 ENCOUNTER — Ambulatory Visit (INDEPENDENT_AMBULATORY_CARE_PROVIDER_SITE_OTHER): Payer: BC Managed Care – PPO | Admitting: Women's Health

## 2012-07-23 ENCOUNTER — Encounter: Payer: Self-pay | Admitting: Women's Health

## 2012-07-23 VITALS — BP 136/88 | Ht 66.0 in | Wt 172.0 lb

## 2012-07-23 DIAGNOSIS — Z01419 Encounter for gynecological examination (general) (routine) without abnormal findings: Secondary | ICD-10-CM

## 2012-07-23 DIAGNOSIS — B3731 Acute candidiasis of vulva and vagina: Secondary | ICD-10-CM

## 2012-07-23 DIAGNOSIS — Z13220 Encounter for screening for lipoid disorders: Secondary | ICD-10-CM

## 2012-07-23 DIAGNOSIS — Z833 Family history of diabetes mellitus: Secondary | ICD-10-CM

## 2012-07-23 DIAGNOSIS — B373 Candidiasis of vulva and vagina: Secondary | ICD-10-CM

## 2012-07-23 DIAGNOSIS — E079 Disorder of thyroid, unspecified: Secondary | ICD-10-CM

## 2012-07-23 LAB — CBC WITH DIFFERENTIAL/PLATELET
Basophils Relative: 1 % (ref 0–1)
Eosinophils Absolute: 0.2 10*3/uL (ref 0.0–0.7)
Lymphs Abs: 2 10*3/uL (ref 0.7–4.0)
MCH: 31.1 pg (ref 26.0–34.0)
MCHC: 34.2 g/dL (ref 30.0–36.0)
Neutro Abs: 4.9 10*3/uL (ref 1.7–7.7)
Neutrophils Relative %: 64 % (ref 43–77)
Platelets: 390 10*3/uL (ref 150–400)
RBC: 4.83 MIL/uL (ref 3.87–5.11)

## 2012-07-23 LAB — WET PREP FOR TRICH, YEAST, CLUE
Clue Cells Wet Prep HPF POC: NONE SEEN
Trich, Wet Prep: NONE SEEN

## 2012-07-23 LAB — TSH: TSH: 1.996 u[IU]/mL (ref 0.350–4.500)

## 2012-07-23 LAB — GLUCOSE, RANDOM: Glucose, Bld: 96 mg/dL (ref 70–99)

## 2012-07-23 MED ORDER — FLUCONAZOLE 150 MG PO TABS: 150.0000 mg | ORAL_TABLET | Freq: Once | ORAL | Status: DC

## 2012-07-23 NOTE — Patient Instructions (Signed)

## 2012-07-23 NOTE — Progress Notes (Signed)
LASHAUNDRA LEHRMANN 10-09-1970 [id number]    History:    The patient presents for annual exam.  Regular monthly 5 day cycles/vasectomy. History of normal Paps. Has not had a mammogram. Had bronchitis this this month on antibiotics complaining of a vaginal discharge.   Past medical history, past surgical history, family history and social history were all reviewed and documented in the EPIC chart. 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 Griffin 7, Owen 4,  both doing well. History of GDM.  ROS:  A  ROS was performed and pertinent positives and negatives are included in the history.  Exam:  Filed Vitals:   07/23/12 1023  BP: 136/88    General appearance:  Normal Head/Neck:  Normal, without cervical or supraclavicular adenopathy. Thyroid:  Symmetrical, normal in size, without palpable masses or nodularity. Respiratory  Effort:  Normal  Auscultation:  Clear without wheezing or rhonchi Cardiovascular  Auscultation:  Regular rate, without rubs, murmurs or gallops  Edema/varicosities:  Not grossly evident Abdominal  Soft,nontender, without masses, guarding or rebound.  Liver/spleen:  No organomegaly noted  Hernia:  None appreciated  Skin  Inspection:  Grossly normal  Palpation:  Grossly normal Neurologic/psychiatric  Orientation:  Normal with appropriate conversation.  Mood/affect:  Normal  Genitourinary    Breasts: Examined lying and sitting.     Right: Without masses, retractions, discharge or axillary adenopathy.     Left: Without masses, retractions, discharge or axillary adenopathy.   Inguinal/mons:  Normal without inguinal adenopathy  External genitalia:  Normal  BUS/Urethra/Skene's glands:  Normal  Bladder:  Normal  Vagina:  Erythematous, wet prep positive for yeast   Cervix:  Normal  Uterus:   normal in size, shape and contour.  Midline and mobile  Adnexa/parametria:     Rt: Without masses or tenderness.   Lt: Without masses or  tenderness.  Anus and perineum: Normal  Digital rectal exam: Normal sphincter tone without palpated masses or tenderness  Assessment/Plan:  41 y.o. M. WF G2 P2 for annual exam.  Yeast vaginitis Regular monthly cycle/vasectomy  Plan: Diflucan 150 times one dose prescription, proper use given and reviewed. SBE's, instructed to schedule annual screening mammogram, breast center number given. Encouraged regular exercise, calcium rich diet, vitamin D 1000 daily. CBC, glucose, lipid panel, TSH, UA. Pap normal 2012, new screening guidelines reviewed.    Harrington Challenger WHNP, 11:10 AM 07/23/2012

## 2012-07-24 LAB — URINALYSIS W MICROSCOPIC + REFLEX CULTURE
Bacteria, UA: NONE SEEN
Glucose, UA: NEGATIVE mg/dL
Leukocytes, UA: NEGATIVE
pH: 6 (ref 5.0–8.0)

## 2013-05-10 ENCOUNTER — Encounter: Payer: Self-pay | Admitting: Women's Health

## 2013-10-29 ENCOUNTER — Encounter: Payer: Self-pay | Admitting: Family Medicine

## 2013-10-29 ENCOUNTER — Ambulatory Visit (INDEPENDENT_AMBULATORY_CARE_PROVIDER_SITE_OTHER): Payer: BC Managed Care – PPO | Admitting: Family Medicine

## 2013-10-29 VITALS — BP 124/86 | Temp 98.9°F | Ht 66.0 in | Wt 191.0 lb

## 2013-10-29 DIAGNOSIS — R5381 Other malaise: Secondary | ICD-10-CM

## 2013-10-29 DIAGNOSIS — E785 Hyperlipidemia, unspecified: Secondary | ICD-10-CM

## 2013-10-29 DIAGNOSIS — Z7189 Other specified counseling: Secondary | ICD-10-CM

## 2013-10-29 DIAGNOSIS — J309 Allergic rhinitis, unspecified: Secondary | ICD-10-CM

## 2013-10-29 DIAGNOSIS — R5383 Other fatigue: Secondary | ICD-10-CM

## 2013-10-29 DIAGNOSIS — M79609 Pain in unspecified limb: Secondary | ICD-10-CM

## 2013-10-29 DIAGNOSIS — J302 Other seasonal allergic rhinitis: Secondary | ICD-10-CM

## 2013-10-29 DIAGNOSIS — R635 Abnormal weight gain: Secondary | ICD-10-CM

## 2013-10-29 DIAGNOSIS — R4589 Other symptoms and signs involving emotional state: Secondary | ICD-10-CM | POA: Insufficient documentation

## 2013-10-29 DIAGNOSIS — F329 Major depressive disorder, single episode, unspecified: Secondary | ICD-10-CM

## 2013-10-29 DIAGNOSIS — M79672 Pain in left foot: Secondary | ICD-10-CM | POA: Insufficient documentation

## 2013-10-29 DIAGNOSIS — M79673 Pain in unspecified foot: Secondary | ICD-10-CM | POA: Insufficient documentation

## 2013-10-29 DIAGNOSIS — Z7689 Persons encountering health services in other specified circumstances: Secondary | ICD-10-CM

## 2013-10-29 DIAGNOSIS — Z0000 Encounter for general adult medical examination without abnormal findings: Secondary | ICD-10-CM

## 2013-10-29 DIAGNOSIS — F3289 Other specified depressive episodes: Secondary | ICD-10-CM

## 2013-10-29 LAB — BASIC METABOLIC PANEL
BUN: 15 mg/dL (ref 6–23)
CHLORIDE: 101 meq/L (ref 96–112)
CO2: 29 mEq/L (ref 19–32)
CREATININE: 0.8 mg/dL (ref 0.4–1.2)
Calcium: 9.4 mg/dL (ref 8.4–10.5)
GFR: 81.52 mL/min (ref >60.00)
GLUCOSE: 89 mg/dL (ref 70–99)
Potassium: 4.5 mEq/L (ref 3.5–5.1)
SODIUM: 138 meq/L (ref 135–145)

## 2013-10-29 LAB — LIPID PANEL
CHOL/HDL RATIO: 4
Cholesterol: 220 mg/dL — ABNORMAL HIGH (ref 0–200)
HDL: 55.1 mg/dL (ref >39.00)
LDL CALC: 151 mg/dL — AB (ref 0–99)
Triglycerides: 71 mg/dL (ref 0.0–149.0)
VLDL: 14.2 mg/dL (ref 0.0–40.0)

## 2013-10-29 LAB — T4, FREE: FREE T4: 0.55 ng/dL — AB (ref 0.60–1.60)

## 2013-10-29 LAB — HEMOGLOBIN A1C: HEMOGLOBIN A1C: 5.7 % (ref 4.6–6.5)

## 2013-10-29 LAB — TSH: TSH: 2.11 u[IU]/mL (ref 0.35–5.50)

## 2013-10-29 MED ORDER — FLUTICASONE PROPIONATE 50 MCG/ACT NA SUSP: 2.0000 | Freq: Every day | NASAL | Status: DC

## 2013-10-29 NOTE — Progress Notes (Signed)
Chief Complaint  Patient presents with  . Establish Care    HPI:  Sherry Marquez is here to establish care. Wants medical physical and basic labs including thyroid testing.  Last PCP and physical: sees nancy young in gyn - report UTD  Has the following chronic problems and concerns today:  Patient Active Problem List   Diagnosis Date Noted  . Foot pain - pes planus 10/29/2013  . Seasonal allergies 10/29/2013  . Depressed mood 10/29/2013  . 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  Past Medical History  Diagnosis Date  . IUD     HISTORY OF MIRENA 01/25/06 AND REMOVED IN 2008  . Eczema   . LUMBAR SPRAIN AND STRAIN 12/05/2009    Qualifier: Diagnosis of  By: Sarajane Jews MD, Ishmael Holter   . Allergy   . Depression     Family History  Problem Relation Age of Onset  . Heart disease Father   . Hypertension Father   . Hypertension Brother   . Atrial fibrillation Mother     History   Social History  . Marital Status: Married    Spouse Name: N/A    Number of Children: N/A  . Years of Education: N/A   Social History Main Topics  . Smoking status: Never Smoker   . Smokeless tobacco: Never Used  . Alcohol Use: Yes     Comment: RARELY  . Drug Use: No  . Sexual Activity: Yes    Birth Control/ Protection: Other-see  comments     Comment: SPOUSE VASECTOMY   Other Topics Concern  . None   Social History Narrative   Work or School: homemaker - starting a Psychologist, sport and exercise Situation: lives with husband and two sons      Spiritual Beliefs:spiritual, but not organized      Lifestyle: no regular exercise, diet ok             Current outpatient prescriptions:cetirizine (ZYRTEC) 10 MG tablet, Take 10 mg by mouth daily., Disp: , Rfl: ;  fluticasone (FLONASE) 50 MCG/ACT nasal spray, Place 2 sprays into both nostrils daily., Disp: 16 g, Rfl: 6  EXAM:  Filed Vitals:   10/29/13 0924  BP: 124/86  Temp: 98.9 F (37.2 C)    Body mass index is 30.84 kg/(m^2).  GENERAL: vitals reviewed and listed above, alert, oriented, appears well hydrated and in no acute distress  HEENT: atraumatic, conjunttiva clear, no obvious abnormalities on inspection of external nose and ears  NECK: no obvious masses on inspection, normal appearance of ear canals and TMs, clear nasal congestion, mild post oropharyngeal erythema with PND and cobblestoning, no tonsillar edema or exudate, no sinus TTP  LUNGS: clear to auscultation bilaterally, no wheezes, rales or rhonchi, good air movement  CV: HRRR, no peripheral edema  MS: moves  all extremities without noticeable abnormality -normal gait -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 -NV intact  PSYCH: pleasant and cooperative, no obvious depression or anxiety  ASSESSMENT AND PLAN:  Discussed the following assessment and plan:  Visit for preventive health examination -discussed USPSTF level A and B recs -pelvic and breast exam with Gyn -NON-FASTING labs  Allergic rhinitis - Plan: fluticasone (FLONASE) 50 MCG/ACT nasal spray -discussed options and advised INS and antihistamin  Weight gain - Plan: TSH, T4, Free -lifestyle recs, labs  Fatigue - Plan: TSH, T4, Free -lifestyle recs, labs, likely from mild depression  Encounter to  establish care - Plan: Basic metabolic panel, Hemoglobin A1c, Lipid Panel  Foot pain - pes planus -discussed options including orthotics, shoes, podiatry referral -she will thinks about options and let us know  Depressed mood -mild, no SI or alarm features, seasonal and relate to menstrual cycles -discussed options and she opted to observe for now as improving with weather  -We reviewed the PMH, PSH, FH, SH, Meds and Allergies.  -We provided refills for any medications we will prescribe as needed. -We addressed current concerns per orders and patient instructions. -We have asked for records for pertinent exams, studies, vaccines and notes from previous providers. -We have advised patient to follow up per instructions below.    -Patient advised to return or notify a doctor immediately if symptoms worsen or persist or new concerns arise.  Patient Instructions  -1000 IU Vit D 3 daily, [facility] brand ok  -calcium 1296m - you get about half of this from a healthy diet  -start flonase 2 sprays daily each nostril for 1 month, then 1 spray each nostril daily - continue the zyrtec  -We have ordered labs or studies at this visit. It can take up to 1-2 weeks for results and processing. We will contact you with instructions IF your results are abnormal. Normal results will be released to your [facility] If you have not heard from [place] can not find your results in [facility] weeks please contact our office.  -PLEASE SIGN UP FOR MYCHART TODAY   We recommend the following healthy lifestyle measures: - eat a healthy diet consisting of lots of vegetables, fruits, beans, nuts, seeds, healthy meats such as white chicken and fish and whole grains.  - avoid fried foods, fast food, processed foods, sodas, red meet and other fattening foods.  - get a least 150 minutes of aerobic exercise per week.   -Consider podiatry referral for the foot pain, consider stress test if chest pain worries you or  recurs  -consider counseling or medication for depressed mood  Follow up in: 3-4 months and as needed      Cashe Gatt R.

## 2013-10-29 NOTE — Progress Notes (Signed)
Pre visit review using our clinic review tool, if applicable. No additional management support is needed unless otherwise documented below in the visit note. 

## 2013-10-29 NOTE — Patient Instructions (Addendum)
-  1000 IU Vit D 3 daily, [facility] brand ok  -calcium 1237m - you get about half of this from a healthy diet  -start flonase 2 sprays daily each nostril for 1 month, then 1 spray each nostril daily - continue the zyrtec  -We have ordered labs or studies at this visit. It can take up to 1-2 weeks for results and processing. We will contact you with instructions IF your results are abnormal. Normal results will be released to your [facility] If you have not heard from [place] can not find your results in [facility] weeks please contact our office.  -PLEASE SIGN UP FOR MYCHART TODAY   We recommend the following healthy lifestyle measures: - eat a healthy diet consisting of lots of vegetables, fruits, beans, nuts, seeds, healthy meats such as white chicken and fish and whole grains.  - avoid fried foods, fast food, processed foods, sodas, red meet and other fattening foods.  - get a least 150 minutes of aerobic exercise per week.   -Consider podiatry referral for the foot pain, consider stress test if chest pain worries you or recurs  -consider counseling or medication for depressed mood  Follow up in: 3-4 months and as needed

## 2013-10-29 NOTE — Addendum Note (Signed)
Addended by: Lucretia Kern on: 10/29/2013 03:05 PM   Modules accepted: Orders

## 2013-11-05 ENCOUNTER — Other Ambulatory Visit: Payer: BC Managed Care – PPO

## 2013-11-06 ENCOUNTER — Other Ambulatory Visit (INDEPENDENT_AMBULATORY_CARE_PROVIDER_SITE_OTHER): Payer: BC Managed Care – PPO

## 2013-11-06 DIAGNOSIS — R635 Abnormal weight gain: Secondary | ICD-10-CM

## 2013-11-06 DIAGNOSIS — R5383 Other fatigue: Secondary | ICD-10-CM

## 2013-11-06 DIAGNOSIS — F3289 Other specified depressive episodes: Secondary | ICD-10-CM

## 2013-11-06 DIAGNOSIS — F329 Major depressive disorder, single episode, unspecified: Secondary | ICD-10-CM

## 2013-11-06 DIAGNOSIS — R5381 Other malaise: Secondary | ICD-10-CM

## 2013-11-06 DIAGNOSIS — R4589 Other symptoms and signs involving emotional state: Secondary | ICD-10-CM

## 2013-11-06 LAB — T4, FREE: FREE T4: 0.66 ng/dL (ref 0.60–1.60)

## 2014-01-29 ENCOUNTER — Ambulatory Visit: Payer: BC Managed Care – PPO | Admitting: Family Medicine

## 2014-05-23 ENCOUNTER — Other Ambulatory Visit: Payer: Self-pay

## 2014-06-02 ENCOUNTER — Encounter: Payer: Self-pay | Admitting: Podiatry

## 2014-06-02 ENCOUNTER — Ambulatory Visit (INDEPENDENT_AMBULATORY_CARE_PROVIDER_SITE_OTHER): Payer: BC Managed Care – PPO

## 2014-06-02 ENCOUNTER — Ambulatory Visit (INDEPENDENT_AMBULATORY_CARE_PROVIDER_SITE_OTHER): Payer: BC Managed Care – PPO | Admitting: Podiatry

## 2014-06-02 VITALS — BP 127/80 | HR 74 | Resp 12 | Ht 66.0 in | Wt 190.0 lb

## 2014-06-02 DIAGNOSIS — M79673 Pain in unspecified foot: Secondary | ICD-10-CM

## 2014-06-02 DIAGNOSIS — M2022 Hallux rigidus, left foot: Secondary | ICD-10-CM

## 2014-06-02 DIAGNOSIS — M722 Plantar fascial fibromatosis: Secondary | ICD-10-CM

## 2014-06-02 DIAGNOSIS — M205X2 Other deformities of toe(s) (acquired), left foot: Secondary | ICD-10-CM

## 2014-06-02 NOTE — Patient Instructions (Signed)
\Hallux Rigidus Hallux rigidus is a condition involving pain and a loss of motion of the first (big) toe. The pain gets worse with lifting up (extension) of the toe. This is usually due to arthritic bony bumps (spurring) of the joint at the base of the big toe.  SYMPTOMS   Pain, with lifting up of the toe.  Tenderness over the joint where the big toe meets the foot.  Redness, swelling, and warmth over the top of the base of the big toe (sometimes).  Foot pain, stiffness, and limping. CAUSES  Hallux rigidus is caused by arthritis of the joint where the big toe meets the foot. The arthritis creates a bone spur that pinches the soft tissues when the toe is extended. RISK INCREASES WITH:  Tight shoes with a narrow toe box.  Family history of foot problems.  Gout and rheumatoid and psoriatic arthritis.  History of previous toe injury, including "turf toe."  Long first toe, flat feet, and other big toe bony bumps.  Arthritis of the big toe. PREVENTION   Wear wide-toed shoes that fit well.  Tape the big toe to reduce motion and to prevent pinching of the tissues between the bone.  Maintain physical fitness:  Foot and ankle flexibility.  Muscle strength and endurance. PROGNOSIS  This condition can usually be managed with proper treatment. However, surgery is typically required to prevent the problem from recurring.  RELATED COMPLICATIONS  Injury to other areas of the foot or ankle, caused by abnormal walking in an attempt to avoid the pain felt when walking normally. TREATMENT Treatment first involves stopping the activities that aggravate your symptoms. Ice and medicine can be used to reduce the pain and inflammation. Modifications to shoes may help reduce pain, including wearing stiff-soled shoes, shoes with a wide toe box, inserting a padded donut to relieve pressure on top of the joint, or wearing an arch support. Corticosteroid injections may be given to reduce inflammation.  If nonsurgical treatment is unsuccessful, surgery may be needed. Surgical options include removing the arthritic bony spur, cutting a bone in the foot to change the arc of motion (allowing the toe to extend more), or fusion of the joint (eliminating all motion in the joint at the base of the big toe).  MEDICATION   If pain medicine is needed, nonsteroidal anti-inflammatory medicines (aspirin and ibuprofen), or other minor pain relievers (acetaminophen), are often advised.  Do not take pain medicine for 7 days before surgery.  Prescription pain relievers are usually prescribed only after surgery. Use only as directed and only as much as you need.  Ointments for arthritis, applied to the skin, may give some relief.  Injections of corticosteroids may be given to reduce inflammation. HEAT AND COLD  Cold treatment (icing) relieves pain and reduces inflammation. Cold treatment should be applied for 10 to 15 minutes every 2 to 3 hours, and immediately after activity that aggravates your symptoms. Use ice packs or an ice massage.  Heat treatment may be used before performing the stretching and strengthening activities prescribed by your caregiver, physical therapist, or athletic trainer. Use a heat pack or a warm water soak. SEEK MEDICAL CARE IF:   Symptoms get worse or do not improve in 2 weeks, despite treatment.  After surgery you develop fever, increasing pain, redness, swelling, drainage of fluids, bleeding, or increasing warmth.  New, unexplained symptoms develop. (Drugs used in treatment may produce side effects.) Document Released: 07/25/2005 Document Revised: 12/09/2013 Document Reviewed: 11/06/2008 ExitCare Patient Information  2015 [employer]. This information is not intended to replace advice given to you by your health care provider. Make sure you discuss any questions you have with your health care provider.  

## 2014-06-02 NOTE — Progress Notes (Signed)
   Subjective:    Patient ID: Sherry Marquez, female    DOB: 1971-02-18, 43 y.o.   MRN: [id number]  HPI Comments: N bunion L left 1st dorsal MPJ D couple of years O worsening last 6 months C decreased ROM, and pain A hyperdorsi-flexion or high-heeled shoes T none  Pt complains of heel pain to varying degrees for 4 years, using plantar fascial sleeves for self treatment.  Toe Pain    the bilateral heel pain symptoms are intermittent and occur upon standing and walking and are relieved with rest. Patient tries to wear comfortable flat shoes as much as possible  Review of Systems  Constitutional: Positive for unexpected weight change.  All other systems reviewed and are negative.      Objective:   Physical Exam Orientated 3  Vascular: DP and PT pulses 2/4 bilaterally  Neurological: Ankle reflexes equal and reactive bilaterally Knee reflexes equal and reactive bilaterally  Dermatological: Texture and turgor within normal limits  Musculoskeletal Dorsi and plantar flexion left first MPJ restricted as compared to right There is no crepitus upon range of motion left first MPJ  Palpable tenderness medial plantar fascial areas heels bilaterally There are no palpable lesions in the plantar fascia laterally Patient able to stand on toes and heels invert bilaterally  X-ray examination weightbearing right foot  ntact bony structures without fracture and/or dislocation  Long first metatarsal  Inferior calcaneal spur  Radiographic impression:  No acute bony abnormality noted in the right foot      X-ray examination left foot  Intact bony structure without any fracture and/or dislocation noted  Relatively long first metatars  Narrowing of the first MPJ with squaring of the margins  Inferior calcaneal spur  Radiographic impression:  Mild degenerative osteoarthritis left first MPJ  Inferior calcaneal spur       Assessment & Plan:   Assessment: Bilateral plantar  fasciitis Hallux limitus left  Plan: Patient advised that she has osteoarthritis in the left great toe joint resulting in increasing pain I discussed treatment options for hallux limitus including surgical treatment and conservative care. In regards to surgical treatment I advised patient that a decompression modified Liane Comber bunionectomy was an option. In regards to conservative care described and extension of the orthotic creating a turf toe modification of an orthotic to prevent dorsi and plantar flexion of the left first MPJ.  I also discussed plantar fasciitis with patient and made aware of treatment options including nonsurgical and surgical  At this time I recommended a custom foot orthotic with a left turf toe extension to treat the hallux limitus left and bilateral fasciitis.   Digital scan obtained today for custom foot orthotic for the indication of bilateral fasciitis and hallux limitus left  Notify patient upon receipt of custom foot orthotics

## 2014-06-03 ENCOUNTER — Encounter: Payer: Self-pay | Admitting: Podiatry

## 2014-06-09 ENCOUNTER — Encounter: Payer: Self-pay | Admitting: Podiatry

## 2014-06-23 ENCOUNTER — Ambulatory Visit (INDEPENDENT_AMBULATORY_CARE_PROVIDER_SITE_OTHER): Payer: BC Managed Care – PPO | Admitting: Podiatry

## 2014-06-23 ENCOUNTER — Encounter: Payer: Self-pay | Admitting: Podiatry

## 2014-06-23 VITALS — BP 123/71 | HR 62 | Resp 11

## 2014-06-23 DIAGNOSIS — M205X2 Other deformities of toe(s) (acquired), left foot: Secondary | ICD-10-CM

## 2014-06-23 DIAGNOSIS — M2022 Hallux rigidus, left foot: Principal | ICD-10-CM

## 2014-06-24 NOTE — Progress Notes (Signed)
Patient ID: Sherry Marquez, female   DOB: 08-Sep-1970, 43 y.o.   MRN: [id number]  Subjective: This patient says for dispensing of custom foot orthotics  Objective: No turf toe extension noted on left  Assessment: Incorrect foot orthotic  Plan: Contacted lab and spoke to [place] and reordered the following custom orthotic Carboplast 2-D  Turf toe extension  in shell LEFT  Full length Spenco 3 mm top cover  No need to return original orthotic  Notify patient upon receipt of replacement custom foot orthotic

## 2014-07-10 ENCOUNTER — Encounter: Payer: BC Managed Care – PPO | Admitting: Women's Health

## 2014-07-16 ENCOUNTER — Ambulatory Visit (INDEPENDENT_AMBULATORY_CARE_PROVIDER_SITE_OTHER): Payer: BC Managed Care – PPO | Admitting: Podiatry

## 2014-07-16 ENCOUNTER — Encounter: Payer: Self-pay | Admitting: Podiatry

## 2014-07-16 VITALS — BP 130/87 | HR 82 | Resp 12

## 2014-07-16 DIAGNOSIS — M2022 Hallux rigidus, left foot: Principal | ICD-10-CM

## 2014-07-16 DIAGNOSIS — M722 Plantar fascial fibromatosis: Secondary | ICD-10-CM

## 2014-07-16 DIAGNOSIS — M205X2 Other deformities of toe(s) (acquired), left foot: Secondary | ICD-10-CM

## 2014-07-16 NOTE — Patient Instructions (Addendum)
   ORTHOTIC INSTRUCTIONS   1) The shoe size most likely will increase 1/2 to one full size with your orthotic.  2) If the orthotic is full length, remove the shoe liner and replace it with your orthotic inside your shoe.  3) When purchasing new shoes, go midday and wear the thickness of socks you plan to wear with the shoe. Place orthotics in the shoes based on comfort. Orthotics may not fit in all shoe styles.  4) Wear the orthotic as long as they are comfortable and then remove. Restart the next day and continue this until you can wear the orthotic comfortably all day.

## 2014-07-17 NOTE — Progress Notes (Signed)
Patient ID: Sherry Marquez, female   DOB: [birth year], 43 y.o.   MRN: [id number]  Subjective: This patient presents for dispensing of custom orthotics the original orthotics dispensed on 06/24/2014 were incorrect.  Objective: Custom foot orthotics with turf toe extension left contour satisfactorily  Assessment: Satisfactory fit of custom foot orthotics for the indication of hallux limitus left and I lateral fasciitis  Plan: Orthotics dispensed and wearing instruction provided  Reappoint 6 weeks

## 2014-07-30 ENCOUNTER — Encounter: Payer: Self-pay | Admitting: Women's Health

## 2014-07-30 ENCOUNTER — Other Ambulatory Visit (HOSPITAL_COMMUNITY)
Admission: RE | Admit: 2014-07-30 | Discharge: 2014-07-30 | Disposition: A | Discharge status: Home / Self Care | Payer: BC Managed Care – PPO | Source: Ambulatory Visit | Attending: Gynecology | Admitting: Gynecology

## 2014-07-30 ENCOUNTER — Ambulatory Visit (INDEPENDENT_AMBULATORY_CARE_PROVIDER_SITE_OTHER): Payer: BC Managed Care – PPO | Admitting: Women's Health

## 2014-07-30 VITALS — BP 148/84 | Ht 65.75 in | Wt 199.8 lb

## 2014-07-30 DIAGNOSIS — Z01411 Encounter for gynecological examination (general) (routine) with abnormal findings: Secondary | ICD-10-CM | POA: Diagnosis not present

## 2014-07-30 DIAGNOSIS — Z01419 Encounter for gynecological examination (general) (routine) without abnormal findings: Secondary | ICD-10-CM

## 2014-07-30 DIAGNOSIS — E079 Disorder of thyroid, unspecified: Secondary | ICD-10-CM

## 2014-07-30 LAB — T3 UPTAKE: T3 Uptake: 26 % (ref 22–35)

## 2014-07-30 LAB — T4: T4, Total: 6.3 ug/dL (ref 4.5–12.0)

## 2014-07-30 NOTE — Progress Notes (Signed)
Sherry Marquez 02-Sep-1970 [id number]    History:    Presents for annual exam.  Regular monthly cycle/vasectomy. Normal Pap and mammogram history. Labs at primary care had wanted thyroid function tests repeated.  Past medical history, past surgical history, family history and social history were all reviewed and documented in the EPIC chart. 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 Has 2 sons Owen 6, Laurann [place] 9 having some issues- questionable Asperger's. History of GDM.  ROS:  A ROS was performed and pertinent positives and negatives are included.  Exam:  Filed Vitals:   07/30/14 1530  BP: 148/84    General appearance:  Normal Thyroid:  Symmetrical, normal in size, without palpable masses or nodularity. Respiratory  Auscultation:  Clear without wheezing or rhonchi Cardiovascular  Auscultation:  Regular rate, without rubs, murmurs or gallops  Edema/varicosities:  Not grossly evident Abdominal  Soft,nontender, without masses, guarding or rebound.  Liver/spleen:  No organomegaly noted  Hernia:  None appreciated  Skin  Inspection:  Grossly normal   Breasts: Examined lying and sitting.     Right: Without masses, retractions, discharge or axillary adenopathy.     Left: Without masses, retractions, discharge or axillary adenopathy. Gentitourinary   Inguinal/mons:  Normal without inguinal adenopathy  External genitalia:  Normal  BUS/Urethra/Skene's glands:  Normal  Vagina:  Normal  Cervix:  Normal  Uterus:   normal in size, shape and contour.  Midline and mobile  Adnexa/parametria:     Rt: Without masses or tenderness.   Lt: Without masses or tenderness.  Anus and perineum: Normal  Digital rectal exam: Normal sphincter tone without palpated masses or tenderness  Assessment/Plan:  43 y.o.MWF G2P2  for annual exam.    Monthly cycles/vasectomy Situational stress  Plan: SBE's, annual mammogram, 3-D tomography reviewed and encouraged history of dense breast. Increase  regular exercise and decrease calories for weight loss. Normal labs at primary care, primary care had requested repeat thyroid function. Pap with HR HPV typing, new screening guidelines reviewed, UA, T3, T4,TSH. Encourage counseling for home stressors of child with special needs. Recheck blood pressure away from office slightly elevated. Follow-up with primary care if persists greater than 130/80.   [place], 5:03 PM 07/30/2014

## 2014-07-30 NOTE — Patient Instructions (Signed)

## 2014-07-31 LAB — TSH: TSH: 2.308 u[IU]/mL (ref 0.350–4.500)

## 2014-08-04 ENCOUNTER — Encounter: Payer: Self-pay | Admitting: Women's Health

## 2014-08-05 LAB — CYTOLOGY - PAP

## 2014-08-07 ENCOUNTER — Other Ambulatory Visit: Payer: Self-pay | Admitting: Women's Health

## 2014-08-07 MED ORDER — FLUCONAZOLE 150 MG PO TABS: 150.0000 mg | ORAL_TABLET | Freq: Once | ORAL | Status: DC

## 2014-08-27 ENCOUNTER — Ambulatory Visit: Payer: BC Managed Care – PPO | Admitting: Podiatry

## 2014-11-05 ENCOUNTER — Encounter: Payer: Self-pay | Admitting: Family Medicine

## 2014-11-05 ENCOUNTER — Ambulatory Visit (INDEPENDENT_AMBULATORY_CARE_PROVIDER_SITE_OTHER): Payer: 59 | Admitting: Family Medicine

## 2014-11-05 VITALS — BP 100/70 | HR 68 | Temp 98.9°F | Wt 207.0 lb

## 2014-11-05 DIAGNOSIS — B9789 Other viral agents as the cause of diseases classified elsewhere: Secondary | ICD-10-CM

## 2014-11-05 DIAGNOSIS — J329 Chronic sinusitis, unspecified: Secondary | ICD-10-CM | POA: Diagnosis not present

## 2014-11-05 DIAGNOSIS — B349 Viral infection, unspecified: Secondary | ICD-10-CM | POA: Diagnosis not present

## 2014-11-05 MED ORDER — AMOXICILLIN-POT CLAVULANATE 875-125 MG PO TABS: 1.0000 | ORAL_TABLET | Freq: Two times a day (BID) | ORAL | Status: DC

## 2014-11-05 NOTE — Patient Instructions (Signed)
Viral Sinusitis  If symptoms get better then get worse or last beyond 10 days that is an indication this has turned bacterial so I would encourage you to pick up the augmentin.   I think the sudafed and mucinex are good options too.

## 2014-11-05 NOTE — Progress Notes (Signed)
  Garret Reddish, MD Phone: [phone]  Subjective:   Sherry Marquez is a 44 y.o. year old very pleasant female patient who presents with the following:  Sinus pressure/fevers -Saturday started with chills and fever. Sunday started with fever up to 101s peaking in the mid day. Feels better in morning after resting. Advil helps with temperatures. Sudafed helps some. Mucinex. Maxillary sinus pressure and left ear tenderness. Cough nonproductive. Occasional wheeze no history asthma. Off and on body aches. Received flu shot this year. Day 5 of illness. Gradual onset of symptoms. Temperature last night above 101 and took nsaid this AM and afebrile currently. Some but mild ear pressure on L.   2 kids at home for spring break. Son had something similar a week prior but did not have the sinus pressure and volunteers in kindergarten class. Son did not have flu, but was told flu like virus.   Most bothersome- pressure and ongoing cough.   ROS- no chest pain, shortness of breath. Mild nausea, no vomiting.   Past Medical History- hyperlipidemia, never smoker, seasonal allergies on zyrtec but not flonase currently  Medications- reviewed and updated Current Outpatient Prescriptions  Medication Sig Dispense Refill  . cetirizine (ZYRTEC) 10 MG tablet Take 10 mg by mouth daily.    . Multiple Vitamin (MULTIVITAMIN) tablet Take 1 tablet by mouth daily.     No current facility-administered medications for this visit.    Objective: BP 100/70 mmHg  Pulse 68  Temp(Src) 98.9 F (37.2 C)  Wt 207 lb (93.895 kg)  SpO2 99% Gen: NAD, resting comfortably HEENT: nasal turbinates erythematous and swollen but minimal drainage, oropharynx normal without pharyngeal exudate, TM normal bilaterally- very slight erythema L ear compared to R but not obviously an OM, Mucous membranes are moist. CV: RRR no murmurs rubs or gallops Lungs: CTAB no crackles, wheeze, rhonchi Abdomen: soft/nontender/nondistended/normal  bowel sounds. No rebound or guarding.  Ext: no edema Skin: warm, dry Neuro: grossly normal, moves all extremities  Assessment/Plan:   Viral Sinusitis 4 days of fevers so wrote rx for augmentin if persists > 10 days or double sickening-indications bacterial Advised of symptomatic care (see AVS). Wrote in to use dextromethorphan for nighttime cough Doubt influenza given time course but possibly attenuated as had flu shot Given reasons for return   Meds ordered this encounter  Medications  . amoxicillin-clavulanate (AUGMENTIN) 875-125 MG per tablet    Sig: Take 1 tablet by mouth 2 (two) times daily.    Dispense:  14 tablet    Refill:  0

## 2014-12-14 ENCOUNTER — Encounter (HOSPITAL_COMMUNITY): Payer: Self-pay | Admitting: *Deleted

## 2014-12-14 ENCOUNTER — Emergency Department (HOSPITAL_COMMUNITY)
Admission: EM | Admit: 2014-12-14 | Discharge: 2014-12-14 | Disposition: A | Discharge status: Home / Self Care | Payer: 59 | Source: Home / Self Care | Attending: Family Medicine | Admitting: Family Medicine

## 2014-12-14 DIAGNOSIS — S0501XA Injury of conjunctiva and corneal abrasion without foreign body, right eye, initial encounter: Secondary | ICD-10-CM

## 2014-12-14 MED ORDER — TETRACAINE HCL 0.5 % OP SOLN
OPHTHALMIC | Status: AC
  Filled 2014-12-14: qty 2

## 2014-12-14 MED ORDER — TOBRAMYCIN 0.3 % OP OINT: TOPICAL_OINTMENT | Freq: Three times a day (TID) | OPHTHALMIC | Status: DC

## 2014-12-14 MED ORDER — EYE WASH OPHTH SOLN
OPHTHALMIC | Status: AC
  Filled 2014-12-14: qty 118

## 2014-12-14 MED ORDER — TOBRAMYCIN 0.3 % OP OINT
TOPICAL_OINTMENT | Freq: Once | OPHTHALMIC | Status: AC
  Administered 2014-12-14: 10:00:00 via OPHTHALMIC

## 2014-12-14 MED ORDER — TOBRAMYCIN 0.3 % OP OINT
TOPICAL_OINTMENT | OPHTHALMIC | Status: AC
  Filled 2014-12-14: qty 3.5

## 2014-12-14 NOTE — ED Notes (Signed)
Reports getting accidentally poked in eye last night by her son.  C/O ongoing right eye pain, light sensitivity, tearing.  Has applied cold compresses, taken Advil and been keeping eye closed.  Does not wear contact lenses.

## 2014-12-14 NOTE — ED Provider Notes (Signed)
CSN: [id number]     Arrival date & time 12/14/14  0906 History   First MD Initiated Contact with Patient 12/14/14 [phone]     Chief Complaint  Patient presents with  . Eye Injury   (Consider location/radiation/quality/duration/timing/severity/associated sxs/prior Treatment) Patient is a 44 y.o. female presenting with eye injury. The history is provided by the patient.  Eye Injury This is a new problem. The current episode started yesterday. The problem has been gradually improving. Associated symptoms comments: Photophobia, .    Past Medical History  Diagnosis Date  . IUD     HISTORY OF MIRENA 01/25/06 AND REMOVED IN 2008  . Eczema   . LUMBAR SPRAIN AND STRAIN 12/05/2009    Qualifier: Diagnosis of  By: Sarajane Jews MD, Ishmael Holter   . Allergy   . Depression    Past Surgical History  Procedure Laterality Date  . Wisdom tooth extraction  2001  . Cesarean section     Family History  Problem Relation Age of Onset  . Heart disease Father   . Hypertension Father   . Hypertension Brother   . Atrial fibrillation Mother    History  Substance Use Topics  . Smoking status: Never Smoker   . Smokeless tobacco: Never Used  . Alcohol Use: Yes     Comment: occasional   OB History    Gravida Para Term Preterm AB TAB SAB Ectopic Multiple Living   3 2   1     2      Review of Systems  Constitutional: Negative.   HENT: Negative.   Eyes: Positive for photophobia, pain and redness. Negative for discharge, itching and visual disturbance.    Allergies  Review of patient's allergies indicates no known allergies.  Home Medications   Prior to Admission medications   Medication Sig Start Date End Date Taking? Authorizing Provider  cetirizine (ZYRTEC) 10 MG tablet Take 10 mg by mouth daily.   Yes Historical Provider, MD  Multiple Vitamin (MULTIVITAMIN) tablet Take 1 tablet by mouth daily.   Yes Historical Provider, MD  amoxicillin-clavulanate (AUGMENTIN) 875-125 MG per tablet Take 1 tablet by mouth 2  (two) times daily. 11/05/14   Marin Olp, MD   BP 117/69 mmHg  Pulse 68  Temp(Src) 98.8 F (37.1 C) (Oral)  Resp 16  SpO2 100%  LMP 11/30/2014 (Approximate) Physical Exam  Constitutional: She appears well-developed and well-nourished.  Eyes: EOM and lids are normal. Pupils are equal, round, and reactive to light. Right eye exhibits no discharge. Right conjunctiva is injected. Right conjunctiva has no hemorrhage. Left conjunctiva is not injected.  Slit lamp exam:      The right eye shows corneal abrasion and fluorescein uptake. The right eye shows no corneal ulcer and no foreign body.    Neck: Normal range of motion. Neck supple.  Nursing note and vitals reviewed.   ED Course  Procedures (including critical care time) Labs Review Labs Reviewed - No data to display  Imaging Review No results found.   MDM   1. Corneal abrasion, right, initial encounter        Billy Fischer, MD 12/14/14 1003

## 2014-12-14 NOTE — Discharge Instructions (Signed)
Use advil, eye drops for 3-5 days, see eye doctor if further problems.

## 2015-01-01 ENCOUNTER — Encounter: Payer: Self-pay | Admitting: Women's Health

## 2015-08-12 ENCOUNTER — Encounter: Payer: Self-pay | Admitting: Women's Health

## 2015-08-12 ENCOUNTER — Ambulatory Visit (INDEPENDENT_AMBULATORY_CARE_PROVIDER_SITE_OTHER): Payer: 59 | Admitting: Women's Health

## 2015-08-12 VITALS — BP 118/80 | Ht 65.0 in | Wt 206.0 lb

## 2015-08-12 DIAGNOSIS — Z13220 Encounter for screening for lipoid disorders: Secondary | ICD-10-CM | POA: Diagnosis not present

## 2015-08-12 DIAGNOSIS — Z01419 Encounter for gynecological examination (general) (routine) without abnormal findings: Secondary | ICD-10-CM | POA: Diagnosis not present

## 2015-08-12 LAB — CBC WITH DIFFERENTIAL/PLATELET
Basophils Absolute: 0 10*3/uL (ref 0.0–0.1)
Basophils Relative: 0 % (ref 0–1)
EOS PCT: 2 % (ref 0–5)
Eosinophils Absolute: 0.2 10*3/uL (ref 0.0–0.7)
HCT: 44.5 % (ref 36.0–46.0)
Hemoglobin: 14.7 g/dL (ref 12.0–15.0)
LYMPHS ABS: 1.5 10*3/uL (ref 0.7–4.0)
Lymphocytes Relative: 19 % (ref 12–46)
MCH: 31.1 pg (ref 26.0–34.0)
MCHC: 33 g/dL (ref 30.0–36.0)
MCV: 94.3 fL (ref 78.0–100.0)
MONOS PCT: 7 % (ref 3–12)
MPV: 9.7 fL (ref 8.6–12.4)
Monocytes Absolute: 0.5 10*3/uL (ref 0.1–1.0)
Neutro Abs: 5.6 10*3/uL (ref 1.7–7.7)
Neutrophils Relative %: 72 % (ref 43–77)
PLATELETS: 341 10*3/uL (ref 150–400)
RBC: 4.72 MIL/uL (ref 3.87–5.11)
RDW: 12.8 % (ref 11.5–15.5)
WBC: 7.8 10*3/uL (ref 4.0–10.5)

## 2015-08-12 LAB — COMPREHENSIVE METABOLIC PANEL
ALT: 18 U/L (ref 6–29)
AST: 16 U/L (ref 10–30)
Albumin: 4.1 g/dL (ref 3.6–5.1)
Alkaline Phosphatase: 74 U/L (ref 33–115)
BUN: 13 mg/dL (ref 7–25)
CO2: 26 mmol/L (ref 20–31)
CREATININE: 0.83 mg/dL (ref 0.50–1.10)
Calcium: 9.5 mg/dL (ref 8.6–10.2)
Chloride: 102 mmol/L (ref 98–110)
GLUCOSE: 96 mg/dL (ref 65–99)
Potassium: 4.4 mmol/L (ref 3.5–5.3)
SODIUM: 137 mmol/L (ref 135–146)
Total Bilirubin: 0.6 mg/dL (ref 0.2–1.2)
Total Protein: 6.8 g/dL (ref 6.1–8.1)

## 2015-08-12 LAB — LIPID PANEL
CHOL/HDL RATIO: 4.5 ratio (ref <=5.0)
Cholesterol: 220 mg/dL — ABNORMAL HIGH (ref 125–200)
HDL: 49 mg/dL (ref >=46)
LDL CALC: 155 mg/dL — AB (ref <130)
Triglycerides: 81 mg/dL (ref <150)
VLDL: 16 mg/dL (ref <30)

## 2015-08-12 NOTE — Patient Instructions (Addendum)
Health Maintenance, Female Adopting a healthy lifestyle and getting preventive care can go a long way to promote health and wellness. Talk with your health care provider about what schedule of regular examinations is right for you. This is a good chance for you to check in with your provider about disease prevention and staying healthy. In between checkups, there are plenty of things you can do on your own. Experts have done a lot of research about which lifestyle changes and preventive measures are most likely to keep you healthy. Ask your health care provider for more information. WEIGHT AND DIET  Eat a healthy diet  Be sure to include plenty of vegetables, fruits, low-fat dairy products, and lean protein.  Do not eat a lot of foods high in solid fats, added sugars, or salt.  Get regular exercise. This is one of the most important things you can do for your health.  Most adults should exercise for at least 150 minutes each week. The exercise should increase your heart rate and make you sweat (moderate-intensity exercise).  Most adults should also do strengthening exercises at least twice a week. This is in addition to the moderate-intensity exercise.  Maintain a healthy weight  Body mass index (BMI) is a measurement that can be used to identify possible weight problems. It estimates body fat based on height and weight. Your health care provider can help determine your BMI and help you achieve or maintain a healthy weight.  For females 20 years of age and older:   A BMI below 18.5 is considered underweight.  A BMI of 18.5 to 24.9 is normal.  A BMI of 25 to 29.9 is considered overweight.  A BMI of 30 and above is considered obese.  Watch levels of cholesterol and blood lipids  You should start having your blood tested for lipids and cholesterol at 44 years of age, then have this test every 5 years.  You may need to have your cholesterol levels checked more often if:  Your lipid  or cholesterol levels are high.  You are older than 44 years of age.  You are at high risk for heart disease.  CANCER SCREENING   Lung Cancer  Lung cancer screening is recommended for adults 55-80 years old who are at high risk for lung cancer because of a history of smoking.  A yearly low-dose CT scan of the lungs is recommended for people who:  Currently smoke.  Have quit within the past 15 years.  Have at least a 30-pack-year history of smoking. A pack year is smoking an average of one pack of cigarettes a day for 1 year.  Yearly screening should continue until it has been 15 years since you quit.  Yearly screening should stop if you develop a health problem that would prevent you from having lung cancer treatment.  Breast Cancer  Practice breast self-awareness. This means understanding how your breasts normally appear and feel.  It also means doing regular breast self-exams. Let your health care provider know about any changes, no matter how small.  If you are in your 20s or 30s, you should have a clinical breast exam (CBE) by a health care provider every 1-3 years as part of a regular health exam.  If you are 40 or older, have a CBE every year. Also consider having a breast X-ray (mammogram) every year.  If you have a family history of breast cancer, talk to your health care provider about genetic screening.  If you   are at high risk for breast cancer, talk to your health care provider about having an MRI and a mammogram every year.  Breast cancer gene (BRCA) assessment is recommended for women who have family members with BRCA-related cancers. BRCA-related cancers include:  Breast.  Ovarian.  Tubal.  Peritoneal cancers.  Results of the assessment will determine the need for genetic counseling and BRCA1 and BRCA2 testing. Cervical Cancer Your health care provider may recommend that you be screened regularly for cancer of the pelvic organs (ovaries, uterus, and  vagina). This screening involves a pelvic examination, including checking for microscopic changes to the surface of your cervix (Pap test). You may be encouraged to have this screening done every 3 years, beginning at age 21.  For women ages 30-65, health care providers may recommend pelvic exams and Pap testing every 3 years, or they may recommend the Pap and pelvic exam, combined with testing for human papilloma virus (HPV), every 5 years. Some types of HPV increase your risk of cervical cancer. Testing for HPV may also be done on women of any age with unclear Pap test results.  Other health care providers may not recommend any screening for nonpregnant women who are considered low risk for pelvic cancer and who do not have symptoms. Ask your health care provider if a screening pelvic exam is right for you.  If you have had past treatment for cervical cancer or a condition that could lead to cancer, you need Pap tests and screening for cancer for at least 20 years after your treatment. If Pap tests have been discontinued, your risk factors (such as having a new sexual partner) need to be reassessed to determine if screening should resume. Some women have medical problems that increase the chance of getting cervical cancer. In these cases, your health care provider may recommend more frequent screening and Pap tests. Colorectal Cancer  This type of cancer can be detected and often prevented.  Routine colorectal cancer screening usually begins at 44 years of age and continues through 44 years of age.  Your health care provider may recommend screening at an earlier age if you have risk factors for colon cancer.  Your health care provider may also recommend using home test kits to check for hidden blood in the stool.  A small camera at the end of a tube can be used to examine your colon directly (sigmoidoscopy or colonoscopy). This is done to check for the earliest forms of colorectal  cancer.  Routine screening usually begins at age 50.  Direct examination of the colon should be repeated every 5-10 years through 44 years of age. However, you may need to be screened more often if early forms of precancerous polyps or small growths are found. Skin Cancer  Check your skin from head to toe regularly.  Tell your health care provider about any new moles or changes in moles, especially if there is a change in a mole's shape or color.  Also tell your health care provider if you have a mole that is larger than the size of a pencil eraser.  Always use sunscreen. Apply sunscreen liberally and repeatedly throughout the day.  Protect yourself by wearing long sleeves, pants, a wide-brimmed hat, and sunglasses whenever you are outside. HEART DISEASE, DIABETES, AND HIGH BLOOD PRESSURE   High blood pressure causes heart disease and increases the risk of stroke. High blood pressure is more likely to develop in:  People who have blood pressure in the high end   of the normal range (130-139/85-89 mm Hg).  People who are overweight or obese.  People who are African American.  If you are 38-23 years of age, have your blood pressure checked every 3-5 years. If you are 61 years of age or older, have your blood pressure checked every year. You should have your blood pressure measured twice--once when you are at a hospital or clinic, and once when you are not at a hospital or clinic. Record the average of the two measurements. To check your blood pressure when you are not at a hospital or clinic, you can use:  An automated blood pressure machine at a pharmacy.  A home blood pressure monitor.  If you are between 45 years and 39 years old, ask your health care provider if you should take aspirin to prevent strokes.  Have regular diabetes screenings. This involves taking a blood sample to check your fasting blood sugar level.  If you are at a normal weight and have a low risk for diabetes,  have this test once every three years after 44 years of age.  If you are overweight and have a high risk for diabetes, consider being tested at a younger age or more often. PREVENTING INFECTION  Hepatitis B  If you have a higher risk for hepatitis B, you should be screened for this virus. You are considered at high risk for hepatitis B if:  You were born in a country where hepatitis B is common. Ask your health care provider which countries are considered high risk.  Your parents were born in a high-risk country, and you have not been immunized against hepatitis B (hepatitis B vaccine).  You have HIV or AIDS.  You use needles to inject street drugs.  You live with someone who has hepatitis B.  You have had sex with someone who has hepatitis B.  You get hemodialysis treatment.  You take certain medicines for conditions, including cancer, organ transplantation, and autoimmune conditions. Hepatitis C  Blood testing is recommended for:  Everyone born from [birth year] through 1964.  Anyone with known risk factors for hepatitis C. Sexually transmitted infections (STIs)  You should be screened for sexually transmitted infections (STIs) including gonorrhea and chlamydia if:  You are sexually active and are younger than 44 years of age.  You are older than 44 years of age and your health care provider tells you that you are at risk for this type of infection.  Your sexual activity has changed since you were last screened and you are at an increased risk for chlamydia or gonorrhea. Ask your health care provider if you are at risk.  If you do not have HIV, but are at risk, it may be recommended that you take a prescription medicine daily to prevent HIV infection. This is called pre-exposure prophylaxis (PrEP). You are considered at risk if:  You are sexually active and do not regularly use condoms or know the HIV status of your partner(s).  You take drugs by injection.  You are sexually  active with a partner who has HIV. Talk with your health care provider about whether you are at high risk of being infected with HIV. If you choose to begin PrEP, you should first be tested for HIV. You should then be tested every 3 months for as long as you are taking PrEP.  PREGNANCY   If you are premenopausal and you may become pregnant, ask your health care provider about preconception counseling.  If you may  become pregnant, take 400 to 800 micrograms (mcg) of folic acid every day.  If you want to prevent pregnancy, talk to your health care provider about birth control (contraception). OSTEOPOROSIS AND MENOPAUSE   Osteoporosis is a disease in which the bones lose minerals and strength with aging. This can result in serious bone fractures. Your risk for osteoporosis can be identified using a bone density scan.  If you are 65 years of age or older, or if you are at risk for osteoporosis and fractures, ask your health care provider if you should be screened.  Ask your health care provider whether you should take a calcium or vitamin D supplement to lower your risk for osteoporosis.  Menopause may have certain physical symptoms and risks.  Hormone replacement therapy may reduce some of these symptoms and risks. Talk to your health care provider about whether hormone replacement therapy is right for you.  HOME CARE INSTRUCTIONS   Schedule regular health, dental, and eye exams.  Stay current with your immunizations.   Do not use any tobacco products including cigarettes, chewing tobacco, or electronic cigarettes.  If you are pregnant, do not drink alcohol.  If you are breastfeeding, limit how much and how often you drink alcohol.  Limit alcohol intake to no more than 1 drink per day for nonpregnant women. One drink equals 12 ounces of beer, 5 ounces of wine, or 1 ounces of hard liquor.  Do not use street drugs.  Do not share needles.  Ask your health care provider for help if  you need support or information about quitting drugs.  Tell your health care provider if you often feel depressed.  Tell your health care provider if you have ever been abused or do not feel safe at home.   This information is not intended to replace advice given to you by your health care provider. Make sure you discuss any questions you have with your health care provider.   Document Released: 02/07/2011 Document Revised: 08/15/2014 Document Reviewed: 06/26/2013 Elsevier Interactive Patient Education 2016 [employer] Diabetes Mellitus and Food It is important for you to manage your blood sugar (glucose) level. Your blood glucose level can be greatly affected by what you eat. Eating healthier foods in the appropriate amounts throughout the day at about the same time each day will help you control your blood glucose level. It can also help slow or prevent worsening of your diabetes mellitus. Healthy eating may even help you improve the level of your blood pressure and reach or maintain a healthy weight.  General recommendations for healthful eating and cooking habits include:  Eating meals and snacks regularly. Avoid going long periods of time without eating to lose weight.  Eating a diet that consists mainly of plant-based foods, such as fruits, vegetables, nuts, legumes, and whole grains.  Using low-heat cooking methods, such as baking, instead of high-heat cooking methods, such as deep frying. Work with your dietitian to make sure you understand how to use the Nutrition Facts information on food labels. HOW CAN FOOD AFFECT ME? Carbohydrates Carbohydrates affect your blood glucose level more than any other type of food. Your dietitian will help you determine how many carbohydrates to eat at each meal and teach you how to count carbohydrates. Counting carbohydrates is important to keep your blood glucose at a healthy level, especially if you are using insulin or taking certain medicines for  diabetes mellitus. Alcohol Alcohol can cause sudden decreases in blood glucose (hypoglycemia), especially if you   use insulin or take certain medicines for diabetes mellitus. Hypoglycemia can be a life-threatening condition. Symptoms of hypoglycemia (sleepiness, dizziness, and disorientation) are similar to symptoms of having too much alcohol.  If your health care provider has given you approval to drink alcohol, do so in moderation and use the following guidelines:  Women should not have more than one drink per day, and men should not have more than two drinks per day. One drink is equal to:  12 oz of beer.  5 oz of wine.  1 oz of hard liquor.  Do not drink on an empty stomach.  Keep yourself hydrated. Have water, diet soda, or unsweetened iced tea.  Regular soda, juice, and other mixers might contain a lot of carbohydrates and should be counted. WHAT FOODS ARE NOT RECOMMENDED? As you make food choices, it is important to remember that all foods are not the same. Some foods have fewer nutrients per serving than other foods, even though they might have the same number of calories or carbohydrates. It is difficult to get your body what it needs when you eat foods with fewer nutrients. Examples of foods that you should avoid that are high in calories and carbohydrates but low in nutrients include:  Trans fats (most processed foods list trans fats on the Nutrition Facts label).  Regular soda.  Juice.  Candy.  Sweets, such as cake, pie, doughnuts, and cookies.  Fried foods. WHAT FOODS CAN I EAT? Eat nutrient-rich foods, which will nourish your body and keep you healthy. The food you should eat also will depend on several factors, including:  The calories you need.  The medicines you take.  Your weight.  Your blood glucose level.  Your blood pressure level.  Your cholesterol level. You should eat a variety of foods, including:  Protein.  Lean cuts of meat.  Proteins low  in saturated fats, such as fish, egg whites, and beans. Avoid processed meats.  Fruits and vegetables.  Fruits and vegetables that may help control blood glucose levels, such as apples, mangoes, and yams.  Dairy products.  Choose fat-free or low-fat dairy products, such as milk, yogurt, and cheese.  Grains, bread, pasta, and rice.  Choose whole grain products, such as multigrain bread, whole oats, and brown rice. These foods may help control blood pressure.  Fats.  Foods containing healthful fats, such as nuts, avocado, olive oil, canola oil, and fish. DOES EVERYONE WITH DIABETES MELLITUS HAVE THE SAME MEAL PLAN? Because every person with diabetes mellitus is different, there is not one meal plan that works for everyone. It is very important that you meet with a dietitian who will help you create a meal plan that is just right for you.   This information is not intended to replace advice given to you by your health care provider. Make sure you discuss any questions you have with your health care provider.   Document Released: 04/21/2005 Document Revised: 08/15/2014 Document Reviewed: 06/21/2013 Elsevier Interactive Patient Education 2016 [employer] Inc[employer]  

## 2015-08-12 NOTE — Progress Notes (Signed)
Sherry Marquez 04/11/1971 [id number]    History:    Presents for annual exam.  Monthly cycle every 24-30 days/vasectomy. Reports mother went through menopause in early 7s. Normal Pap and mammogram history. Reports arm tingling only when sleeping on, none during the day.  Past medical history, past surgical history, family history and social history were all reviewed and documented in the EPIC chart. Has her own business organizes homes. 2 sons ages 43 and 33 oldest son high functioning autism.   ROS:  A ROS was performed and pertinent positives and negatives are included.  Exam:  Filed Vitals:   08/12/15 0951  BP: 118/80    General appearance:  Normal Thyroid:  Symmetrical, normal in size, without palpable masses or nodularity. Respiratory  Auscultation:  Clear without wheezing or rhonchi Cardiovascular  Auscultation:  Regular rate, without rubs, murmurs or gallops  Edema/varicosities:  Not grossly evident Abdominal  Soft,nontender, without masses, guarding or rebound.  Liver/spleen:  No organomegaly noted  Hernia:  None appreciated  Skin  Inspection:  Grossly normal   Breasts: Examined lying and sitting.     Right: Without masses, retractions, discharge or axillary adenopathy.     Left: Without masses, retractions, discharge or axillary adenopathy. Gentitourinary   Inguinal/mons:  Normal without inguinal adenopathy  External genitalia:  Normal  BUS/Urethra/Skene's glands:  Normal  Vagina:  Normal  Cervix:  Normal  Uterus:  normal in size, shape and contour.  Midline and mobile  Adnexa/parametria:     Rt: Without masses or tenderness.   Lt: Without masses or tenderness.  Anus and perineum: Normal  Digital rectal exam: Normal sphincter tone without palpated masses or tenderness  Assessment/Plan:  45 y.o. M WF G2 P2 for annual exam with complaint of weight gain related to lifestyle.  Monthly cycle/vasectomy Overweight  Plan: Reviewed importance of increasing regular  exercise and decreasing calories/carbs for weight loss. SBE's, continue annual screening mammogram 3-D tomography reviewed and encouraged history of dense breast. Vitamin D 1000 daily encouraged. CBC, CMP, lipid panel, UA, Pap normal 2015, new screening guidelines reviewed.  [place], 1:46 PM 08/12/2015

## 2015-08-13 LAB — URINALYSIS W MICROSCOPIC + REFLEX CULTURE
BACTERIA UA: NONE SEEN [HPF]
Bilirubin Urine: NEGATIVE
CASTS: NONE SEEN [LPF]
Crystals: NONE SEEN [HPF]
Glucose, UA: NEGATIVE
Hgb urine dipstick: NEGATIVE
KETONES UR: NEGATIVE
Leukocytes, UA: NEGATIVE
Nitrite: NEGATIVE
PROTEIN: NEGATIVE
RBC / HPF: NONE SEEN RBC/HPF (ref <=2)
SPECIFIC GRAVITY, URINE: 1.006 (ref 1.001–1.035)
Squamous Epithelial / LPF: NONE SEEN [HPF] (ref <=5)
WBC, UA: NONE SEEN WBC/HPF (ref <=5)
Yeast: NONE SEEN [HPF]
pH: 7 (ref 5.0–8.0)

## 2015-08-14 ENCOUNTER — Other Ambulatory Visit: Payer: Self-pay | Admitting: Women's Health

## 2015-08-14 DIAGNOSIS — E7800 Pure hypercholesterolemia, unspecified: Secondary | ICD-10-CM

## 2015-11-06 ENCOUNTER — Encounter: Payer: Self-pay | Admitting: Family Medicine

## 2015-11-06 ENCOUNTER — Ambulatory Visit (INDEPENDENT_AMBULATORY_CARE_PROVIDER_SITE_OTHER): Payer: 59 | Admitting: Family Medicine

## 2015-11-06 VITALS — BP 128/82 | HR 75 | Temp 98.1°F | Ht 65.0 in | Wt 206.7 lb

## 2015-11-06 DIAGNOSIS — H6592 Unspecified nonsuppurative otitis media, left ear: Secondary | ICD-10-CM

## 2015-11-06 DIAGNOSIS — J329 Chronic sinusitis, unspecified: Secondary | ICD-10-CM

## 2015-11-06 NOTE — Patient Instructions (Signed)
Afrin nasal spray 2x daily for 5 days then stop.  flonase 2 sprays each nostril daily for 21 days.  -can use tylenol (in no history of liver disease) or ibuprofen (if no history of kidney disease, bowel bleeding or significant heart disease) as directed for aches and sorethroat  -if you are taking a cough medication - use only as directed, may also try a teaspoon of honey to coat the throat and throat lozenges.  -for sore throat, salt water gargles can help  -follow up if you have fevers, facial pain, tooth pain, difficulty breathing or are worsening or symptoms persist longer then expected  Upper Respiratory Infection, Adult An upper respiratory infection (URI) is also known as the common cold. It is often caused by a type of germ (virus). Colds are easily spread (contagious). You can pass it to others by kissing, coughing, sneezing, or drinking out of the same glass. Usually, you get better in 1 to 3  weeks.  However, the cough can last for even longer. HOME CARE   Only take medicine as told by your doctor. Follow instructions provided above.  Drink enough water and fluids to keep your pee (urine) clear or pale yellow.  Get plenty of rest.  Return to work when your temperature is < 100 for 24 hours or as told by your doctor. You may use a face mask and wash your hands to stop your cold from spreading. GET HELP RIGHT AWAY IF:   After the first few days, you feel you are getting worse.  You have questions about your medicine.  You have chills, shortness of breath, or red spit (mucus).  You have pain in the face for more then 1-2 days, especially when you bend forward.  You have a fever, puffy (swollen) neck, pain when you swallow, or white spots in the back of your throat.  You have a bad headache, ear pain, sinus pain, or chest pain.  You have a high-pitched whistling sound when you breathe in and out (wheezing).  You cough up blood.  You have sore muscles or a stiff  neck. MAKE SURE YOU:   Understand these instructions.  Will watch your condition.  Will get help right away if you are not doing well or get worse. Document Released: 01/11/2008 Document Revised: 10/17/2011 Document Reviewed: 10/30/2013 [facility] Patient Information 2015 [place], [place]. This information is not intended to replace advice given to you by your health care provider. Make sure you discuss any questions you have with your health care provider.

## 2015-11-06 NOTE — Progress Notes (Signed)
Pre visit review using our clinic review tool, if applicable. No additional management support is needed unless otherwise documented below in the visit note. 

## 2015-11-06 NOTE — Progress Notes (Signed)
HPI:  URI: -started: about 1 week ago -symptoms:nasal congestion, sore throat, cough - intermittently, L ear pain last night, hx allergies -denies:fever, SOB, NVD, tooth pain, sinus pain, body aches -has tried: take antihistamine -Hx of: allergies  ROS: See pertinent positives and negatives per HPI.  Past Medical History  Diagnosis Date  . IUD     HISTORY OF MIRENA 01/25/06 AND REMOVED IN 2008  . Eczema   . LUMBAR SPRAIN AND STRAIN 12/05/2009    Qualifier: Diagnosis of  By: Sarajane Jews MD, Ishmael Holter   . Allergy     Past Surgical History  Procedure Laterality Date  . Wisdom tooth extraction  2001  . Cesarean section      Family History  Problem Relation Age of Onset  . Heart disease Father   . Hypertension Father   . Hypertension Brother   . Atrial fibrillation Mother     Social History   Social History  . Marital Status: Married    Spouse Name: N/A  . Number of Children: N/A  . Years of Education: N/A   Social History Main Topics  . Smoking status: Never Smoker   . Smokeless tobacco: Never Used  . Alcohol Use: Yes     Comment: occasional  . Drug Use: No  . Sexual Activity: Yes    Birth Control/ Protection: Other-see comments     Comment: SPOUSE VASECTOMY   Other Topics Concern  . None   Social History Narrative   Work or School: homemaker - starting a Psychologist, sport and exercise Situation: lives with husband and two sons      Spiritual Beliefs:spiritual, but not organized      Lifestyle: no regular exercise, diet ok              Current outpatient prescriptions:  .  cetirizine (ZYRTEC) 10 MG tablet, Take 10 mg by mouth daily., Disp: , Rfl:  .  Multiple Vitamin (MULTIVITAMIN) tablet, Take 1 tablet by mouth daily., Disp: , Rfl:   EXAM:  Filed Vitals:   11/06/15 1527  BP: 128/82  Pulse: 75  Temp: 98.1 F (36.7 C)    Body mass index is 34.4 kg/(m^2).  GENERAL: vitals reviewed and listed above, alert, oriented, appears well  hydrated and in no acute distress  HEENT: atraumatic, conjunttiva clear, no obvious abnormalities on inspection of external nose and ears, normal appearance of ear canals and TMs, clear effusion L ear, clear nasal congestion, mild post oropharyngeal erythema with PND, no tonsillar edema or exudate, no sinus TTP  NECK: no obvious masses on inspection  LUNGS: clear to auscultation bilaterally, no wheezes, rales or rhonchi, good air movement  CV: HRRR, no peripheral edema  MS: moves all extremities without noticeable abnormality  PSYCH: pleasant and cooperative, no obvious depression or anxiety  ASSESSMENT AND PLAN:  Discussed the following assessment and plan:  Rhinosinusitis  Middle ear effusion, left  -given HPI and exam findings today, a serious infection or illness is unlikely. We discussed potential etiologies, with VURI or allergic rhinitis being most likely with ear effusion from ETD. Tx with short course nasal decongestant and INS. We discussed treatment side effects, likely course, antibiotic misuse, transmission, and signs of developing a serious illness. -declined tdap -reports sees gyn for annual exams and cholesterol checks -of course, we advised to return or notify a doctor immediately if symptoms worsen or persist or new concerns arise.    Patient Instructions  Afrin nasal  spray 2x daily for 5 days then stop.  flonase 2 sprays each nostril daily for 21 days.  -can use tylenol (in no history of liver disease) or ibuprofen (if no history of kidney disease, bowel bleeding or significant heart disease) as directed for aches and sorethroat  -if you are taking a cough medication - use only as directed, may also try a teaspoon of honey to coat the throat and throat lozenges.  -for sore throat, salt water gargles can help  -follow up if you have fevers, facial pain, tooth pain, difficulty breathing or are worsening or symptoms persist longer then expected  Upper  Respiratory Infection, Adult An upper respiratory infection (URI) is also known as the common cold. It is often caused by a type of germ (virus). Colds are easily spread (contagious). You can pass it to others by kissing, coughing, sneezing, or drinking out of the same glass. Usually, you get better in 1 to 3  weeks.  However, the cough can last for even longer. HOME CARE   Only take medicine as told by your doctor. Follow instructions provided above.  Drink enough water and fluids to keep your pee (urine) clear or pale yellow.  Get plenty of rest.  Return to work when your temperature is < 100 for 24 hours or as told by your doctor. You may use a face mask and wash your hands to stop your cold from spreading. GET HELP RIGHT AWAY IF:   After the first few days, you feel you are getting worse.  You have questions about your medicine.  You have chills, shortness of breath, or red spit (mucus).  You have pain in the face for more then 1-2 days, especially when you bend forward.  You have a fever, puffy (swollen) neck, pain when you swallow, or white spots in the back of your throat.  You have a bad headache, ear pain, sinus pain, or chest pain.  You have a high-pitched whistling sound when you breathe in and out (wheezing).  You cough up blood.  You have sore muscles or a stiff neck. MAKE SURE YOU:   Understand these instructions.  Will watch your condition.  Will get help right away if you are not doing well or get worse. Document Released: 01/11/2008 Document Revised: 10/17/2011 Document Reviewed: 10/30/2013 [facility] Patient Information 2015 [place], [place]. This information is not intended to replace advice given to you by your health care provider. Make sure you discuss any questions you have with your health care provider.      Colin Benton R.

## 2015-11-30 ENCOUNTER — Encounter: Payer: Self-pay | Admitting: Family Medicine

## 2015-11-30 ENCOUNTER — Ambulatory Visit (INDEPENDENT_AMBULATORY_CARE_PROVIDER_SITE_OTHER): Payer: 59 | Admitting: Family Medicine

## 2015-11-30 VITALS — BP 100/76 | HR 63 | Temp 98.8°F | Ht 65.0 in | Wt 206.8 lb

## 2015-11-30 DIAGNOSIS — J329 Chronic sinusitis, unspecified: Secondary | ICD-10-CM

## 2015-11-30 DIAGNOSIS — M542 Cervicalgia: Secondary | ICD-10-CM

## 2015-11-30 DIAGNOSIS — J310 Chronic rhinitis: Secondary | ICD-10-CM

## 2015-11-30 DIAGNOSIS — H6591 Unspecified nonsuppurative otitis media, right ear: Secondary | ICD-10-CM | POA: Diagnosis not present

## 2015-11-30 MED ORDER — DOXYCYCLINE HYCLATE 100 MG PO CAPS: 100.0000 mg | ORAL_CAPSULE | Freq: Two times a day (BID) | ORAL | Status: DC

## 2015-11-30 NOTE — Patient Instructions (Signed)
Start Afrin nasal spray twice daily for 5 days. Do not use longer than 5 days.  Continue your Zyrtec and her Flonase.  Use heat for 15 minutes twice daily.  Use naproxen 1-2 times daily as needed for pain.  If symptoms are worsening or are not improving over the next 3 days, please take the antibiotic as instructed. Please follow instructions and cautions on the bottle.  Follow-up if symptoms are worsening, you have new concerns or your symptoms do not improve with treatment.

## 2015-11-30 NOTE — Progress Notes (Signed)
HPI:  Sherry Marquez is a pleasant 45 year old here for an acute visit for right anterior neck discomfort. She struggles with seasonal allergies and has had sneezing, postnasal drip, some sinus discomfort and right ear discomfort. She did restart her Flonase recently. This is been going on for about 4 days. Denies fevers, tooth pain, difficulty swallowing, nausea, vomiting, shortness of breath. She did do a new yoga class and they were doing a lot a stretching. Go to sleep  ROS: See pertinent positives and negatives per HPI.  Past Medical History  Diagnosis Date  . IUD     HISTORY OF MIRENA 01/25/06 AND REMOVED IN 2008  . Eczema   . LUMBAR SPRAIN AND STRAIN 12/05/2009    Qualifier: Diagnosis of  By: Sarajane Jews MD, Ishmael Holter   . Allergy     Past Surgical History  Procedure Laterality Date  . Wisdom tooth extraction  2001  . Cesarean section      Family History  Problem Relation Age of Onset  . Heart disease Father   . Hypertension Father   . Hypertension Brother   . Atrial fibrillation Mother     Social History   Social History  . Marital Status: Married    Spouse Name: N/A  . Number of Children: N/A  . Years of Education: N/A   Social History Main Topics  . Smoking status: Never Smoker   . Smokeless tobacco: Never Used  . Alcohol Use: Yes     Comment: occasional  . Drug Use: No  . Sexual Activity: Yes    Birth Control/ Protection: Other-see comments     Comment: SPOUSE VASECTOMY   Other Topics Concern  . None   Social History Narrative   Work or School: homemaker - starting a Psychologist, sport and exercise Situation: lives with husband and two sons      Spiritual Beliefs:spiritual, but not organized      Lifestyle: no regular exercise, diet ok              Current outpatient prescriptions:  .  cetirizine (ZYRTEC) 10 MG tablet, Take 10 mg by mouth daily., Disp: , Rfl:  .  fluticasone (FLONASE) 50 MCG/ACT nasal spray, Place into both nostrils as  needed for allergies or rhinitis., Disp: , Rfl:  .  Ibuprofen (ADVIL PO), Take by mouth as needed., Disp: , Rfl:  .  Multiple Vitamin (MULTIVITAMIN) tablet, Take 1 tablet by mouth daily., Disp: , Rfl:  .  doxycycline (VIBRAMYCIN) 100 MG capsule, Take 1 capsule (100 mg total) by mouth 2 (two) times daily., Disp: 20 capsule, Rfl: 0  EXAM:  Filed Vitals:   11/30/15 1009  BP: 100/76  Pulse: 63  Temp: 98.8 F (37.1 C)    Body mass index is 34.41 kg/(m^2).  GENERAL: vitals reviewed and listed above, alert, oriented, appears well hydrated and in no acute distress  HEENT: atraumatic, conjunttiva clear, no obvious abnormalities on inspection of external nose and ears, allergic shiners, normal appearance of ear canals and TMs except for clear effusions bilaterally,thick nasal congestion, boggy turbinates,  mild post oropharyngeal erythema with PND, no tonsillar edema or exudate, no sinus TTP, mild tenderness in the subcutaneous mandibular and anterior cervical lymph nodes. No significant lymphadenopathy.  NECK: no obvious masses on inspection, no meningeal signs, no significant lymphadenopathy.  LUNGS: clear to auscultation bilaterally, no wheezes, rales or rhonchi, good air movement  CV: HRRR, no peripheral edema  MS: moves all  extremities without noticeable abnormality  PSYCH: pleasant and cooperative, no obvious depression or anxiety  ASSESSMENT AND PLAN:  Discussed the following assessment and plan:  Rhinosinusitis  Middle ear effusion, right  Neck pain  -I think this likely combination of allergies and the new exercises she did with some muscle soreness in class -  will treat per patient instructions. -She may have a developing right maxillary sinusitis given her discomfort over the sinus area and thick nasal congestion along with chronic allergic rhinitis. We opted to do an antibiotic after discussion of risks, proper use and possible benefits if the symptoms persist despite  other treatments. Advised to shred and discard the prescription if she does not use it. -Patient advised to return or notify a doctor immediately if symptoms worsen or persist or new concerns arise.  Patient Instructions  Start Afrin nasal spray twice daily for 5 days. Do not use longer than 5 days.  Continue your Zyrtec and her Flonase.  Use heat for 15 minutes twice daily.  Use naproxen 1-2 times daily as needed for pain.  If symptoms are worsening or are not improving over the next 3 days, please take the antibiotic as instructed. Please follow instructions and cautions on the bottle.  Follow-up if symptoms are worsening, you have new concerns or your symptoms do not improve with treatment.     Colin Benton R.

## 2015-11-30 NOTE — Progress Notes (Signed)
Pre visit review using our clinic review tool, if applicable. No additional management support is needed unless otherwise documented below in the visit note. 

## 2016-04-18 ENCOUNTER — Encounter: Payer: Self-pay | Admitting: Women's Health

## 2016-09-14 ENCOUNTER — Ambulatory Visit (INDEPENDENT_AMBULATORY_CARE_PROVIDER_SITE_OTHER): Payer: 59 | Admitting: Women's Health

## 2016-09-14 ENCOUNTER — Encounter: Payer: Self-pay | Admitting: Women's Health

## 2016-09-14 VITALS — BP 120/82 | Ht 65.5 in | Wt 192.0 lb

## 2016-09-14 DIAGNOSIS — Z01419 Encounter for gynecological examination (general) (routine) without abnormal findings: Secondary | ICD-10-CM

## 2016-09-14 DIAGNOSIS — Z13220 Encounter for screening for lipoid disorders: Secondary | ICD-10-CM | POA: Diagnosis not present

## 2016-09-14 DIAGNOSIS — N912 Amenorrhea, unspecified: Secondary | ICD-10-CM | POA: Diagnosis not present

## 2016-09-14 DIAGNOSIS — Z1151 Encounter for screening for human papillomavirus (HPV): Secondary | ICD-10-CM | POA: Diagnosis not present

## 2016-09-14 LAB — LIPID PANEL
CHOL/HDL RATIO: 3.9 ratio (ref <5.0)
Cholesterol: 210 mg/dL — ABNORMAL HIGH (ref <200)
HDL: 54 mg/dL (ref >50)
LDL Cholesterol: 140 mg/dL — ABNORMAL HIGH (ref <100)
Triglycerides: 79 mg/dL (ref <150)
VLDL: 16 mg/dL (ref <30)

## 2016-09-14 LAB — CBC WITH DIFFERENTIAL/PLATELET
BASOS ABS: 58 {cells}/uL (ref 0–200)
Basophils Relative: 1 %
EOS ABS: 116 {cells}/uL (ref 15–500)
Eosinophils Relative: 2 %
HCT: 45.4 % — ABNORMAL HIGH (ref 35.0–45.0)
HEMOGLOBIN: 14.8 g/dL (ref 11.7–15.5)
LYMPHS ABS: 1566 {cells}/uL (ref 850–3900)
Lymphocytes Relative: 27 %
MCH: 30.6 pg (ref 27.0–33.0)
MCHC: 32.6 g/dL (ref 32.0–36.0)
MCV: 94 fL (ref 80.0–100.0)
MPV: 10.2 fL (ref 7.5–12.5)
Monocytes Absolute: 406 cells/uL (ref 200–950)
Monocytes Relative: 7 %
NEUTROS ABS: 3654 {cells}/uL (ref 1500–7800)
NEUTROS PCT: 63 %
Platelets: 303 10*3/uL (ref 140–400)
RBC: 4.83 MIL/uL (ref 3.80–5.10)
RDW: 13.2 % (ref 11.0–15.0)
WBC: 5.8 10*3/uL (ref 3.8–10.8)

## 2016-09-14 LAB — GLUCOSE, RANDOM: Glucose, Bld: 96 mg/dL (ref 65–99)

## 2016-09-14 NOTE — Progress Notes (Signed)
Sherry Marquez 1971/06/09 [id number]    History:    Presents for annual exam. Monthly cycles until December, skipped January/vasectomy. Having no menopausal symptoms, reports mother early 92s menopausal. Normal Pap and mammogram history. GDM diet first pregnancy.  Past medical history, past surgical history, family history and social history were all reviewed and documented in the EPIC chart. Sons are 8 and 11 both doing well, older son high functioning autism. 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 Mother A. fib, father hypertension and heart disease.  ROS:  A ROS was performed and pertinent positives and negatives are included.  Exam:  Vitals:   09/14/16 0857  BP: 120/82  Weight: 192 lb (87.1 kg)  Height: 5' 5.5" (1.664 m)   Body mass index is 31.46 kg/m.   General appearance:  Normal Thyroid:  Symmetrical, normal in size, without palpable masses or nodularity. Respiratory  Auscultation:  Clear without wheezing or rhonchi Cardiovascular  Auscultation:  Regular rate, without rubs, murmurs or gallops  Edema/varicosities:  Not grossly evident Abdominal  Soft,nontender, without masses, guarding or rebound.  Liver/spleen:  No organomegaly noted  Hernia:  None appreciated  Skin  Inspection:  Grossly normal   Breasts: Examined lying and sitting.     Right: Without masses, retractions, discharge or axillary adenopathy.     Left: Without masses, retractions, discharge or axillary adenopathy. Gentitourinary   Inguinal/mons:  Normal without inguinal adenopathy  External genitalia:  Normal  BUS/Urethra/Skene's glands:  Normal  Vagina:  Normal  Cervix:  Normal  Uterus:  normal in size, shape and contour.  Midline and mobile  Adnexa/parametria:     Rt: Without masses or tenderness.   Lt: Without masses or tenderness.  Anus and perineum: Normal  Digital rectal exam: Normal sphincter tone without palpated masses or tenderness  Assessment/Plan:  46 y.o. M WF G3 P2 for annual exam.      Monthly cycles until December/amenorrhea/vasectomy Weight down 15 pounds on [employer]  Plan: SBE's, continue annual screening mammogram, calcium rich diet, vitamin D 2000 daily encouraged. Graduated on weight loss, will continue [employer] CBC, FSH, glucose, lipid panel, UA, Pap with HR HPV typing, new screening guidelines reviewed. Reviewed if no cycle by the end of February will withdrawl with Provera.    Huel Cote [facility], 9:44 AM 09/14/2016

## 2016-09-14 NOTE — Patient Instructions (Signed)

## 2016-09-14 NOTE — Addendum Note (Signed)
Addended by: Thurnell Garbe A on: 09/14/2016 10:03 AM   Modules accepted: Orders

## 2016-09-15 LAB — URINALYSIS W MICROSCOPIC + REFLEX CULTURE
Bacteria, UA: NONE SEEN [HPF]
Bilirubin Urine: NEGATIVE
Casts: NONE SEEN [LPF]
Crystals: NONE SEEN [HPF]
GLUCOSE, UA: NEGATIVE
HGB URINE DIPSTICK: NEGATIVE
Ketones, ur: NEGATIVE
LEUKOCYTES UA: NEGATIVE
Nitrite: NEGATIVE
Protein, ur: NEGATIVE
Specific Gravity, Urine: 1.025 (ref 1.001–1.035)
WBC, UA: NONE SEEN WBC/HPF (ref <=5)
Yeast: NONE SEEN [HPF]
pH: 6.5 (ref 5.0–8.0)

## 2016-09-15 LAB — FOLLICLE STIMULATING HORMONE: FSH: 11.2 m[IU]/mL

## 2016-09-16 LAB — URINE CULTURE: Organism ID, Bacteria: NO GROWTH

## 2016-09-16 LAB — PAP, TP IMAGING W/ HPV RNA, RFLX HPV TYPE 16,18/45: HPV mRNA, High Risk: NOT DETECTED

## 2017-04-19 LAB — HM MAMMOGRAPHY

## 2017-04-21 ENCOUNTER — Encounter: Payer: Self-pay | Admitting: Family Medicine

## 2017-04-27 ENCOUNTER — Encounter: Payer: Self-pay | Admitting: Family Medicine

## 2017-08-17 ENCOUNTER — Encounter: Payer: Self-pay | Admitting: Family Medicine

## 2017-09-18 ENCOUNTER — Encounter: Payer: Self-pay | Admitting: Women's Health

## 2017-09-18 ENCOUNTER — Ambulatory Visit: Payer: BLUE CROSS/BLUE SHIELD | Admitting: Women's Health

## 2017-09-18 VITALS — BP 118/80 | Ht 65.0 in | Wt 201.0 lb

## 2017-09-18 DIAGNOSIS — Z01419 Encounter for gynecological examination (general) (routine) without abnormal findings: Secondary | ICD-10-CM | POA: Diagnosis not present

## 2017-09-18 DIAGNOSIS — Z13220 Encounter for screening for lipoid disorders: Secondary | ICD-10-CM

## 2017-09-18 LAB — LIPID PANEL
Cholesterol: 237 mg/dL — ABNORMAL HIGH (ref <200)
HDL: 52 mg/dL (ref >50)
LDL CHOLESTEROL (CALC): 167 mg/dL — AB
Non-HDL Cholesterol (Calc): 185 mg/dL (calc) — ABNORMAL HIGH (ref <130)
TRIGLYCERIDES: 80 mg/dL (ref <150)
Total CHOL/HDL Ratio: 4.6 (calc) (ref <5.0)

## 2017-09-18 LAB — CBC WITH DIFFERENTIAL/PLATELET
Basophils Absolute: 43 cells/uL (ref 0–200)
Basophils Relative: 0.7 %
EOS ABS: 0 {cells}/uL — AB (ref 15–500)
EOS PCT: 0 %
HCT: 42.8 % (ref 35.0–45.0)
HEMOGLOBIN: 14.5 g/dL (ref 11.7–15.5)
Lymphs Abs: 1257 cells/uL (ref 850–3900)
MCH: 30.5 pg (ref 27.0–33.0)
MCHC: 33.9 g/dL (ref 32.0–36.0)
MCV: 90.1 fL (ref 80.0–100.0)
MPV: 10.3 fL (ref 7.5–12.5)
Monocytes Relative: 8.5 %
Neutro Abs: 4282 cells/uL (ref 1500–7800)
Neutrophils Relative %: 70.2 %
Platelets: 262 10*3/uL (ref 140–400)
RBC: 4.75 10*6/uL (ref 3.80–5.10)
RDW: 12.4 % (ref 11.0–15.0)
TOTAL LYMPHOCYTE: 20.6 %
WBC mixed population: 519 cells/uL (ref 200–950)
WBC: 6.1 10*3/uL (ref 3.8–10.8)

## 2017-09-18 LAB — COMPREHENSIVE METABOLIC PANEL
AG Ratio: 1.9 (calc) (ref 1.0–2.5)
ALT: 19 U/L (ref 6–29)
AST: 17 U/L (ref 10–35)
Albumin: 4.2 g/dL (ref 3.6–5.1)
Alkaline phosphatase (APISO): 63 U/L (ref 33–115)
BUN: 11 mg/dL (ref 7–25)
CALCIUM: 9.3 mg/dL (ref 8.6–10.2)
CO2: 27 mmol/L (ref 20–32)
Chloride: 104 mmol/L (ref 98–110)
Creat: 0.74 mg/dL (ref 0.50–1.10)
Globulin: 2.2 g/dL (calc) (ref 1.9–3.7)
Glucose, Bld: 98 mg/dL (ref 65–99)
Potassium: 4.5 mmol/L (ref 3.5–5.3)
Sodium: 138 mmol/L (ref 135–146)
Total Bilirubin: 0.7 mg/dL (ref 0.2–1.2)
Total Protein: 6.4 g/dL (ref 6.1–8.1)

## 2017-09-18 NOTE — Patient Instructions (Signed)
Carbohydrate Counting for Diabetes Mellitus, Adult Carbohydrate counting is a method for keeping track of how many carbohydrates you eat. Eating carbohydrates naturally increases the amount of sugar (glucose) in the blood. Counting how many carbohydrates you eat helps keep your blood glucose within normal limits, which helps you manage your diabetes (diabetes mellitus). It is important to know how many carbohydrates you can safely have in each meal. This is different for every person. A diet and nutrition specialist (registered dietitian) can help you make a meal plan and calculate how many carbohydrates you should have at each meal and snack. Carbohydrates are found in the following foods:  Grains, such as breads and cereals.  Dried beans and soy products.  Starchy vegetables, such as potatoes, peas, and corn.  Fruit and fruit juices.  Milk and yogurt.  Sweets and snack foods, such as cake, cookies, candy, chips, and soft drinks.  How do I count carbohydrates? There are two ways to count carbohydrates in food. You can use either of the methods or a combination of both. Reading "Nutrition Facts" on packaged food The "Nutrition Facts" list is included on the labels of almost all packaged foods and beverages in the [place] It includes:  The serving size.  Information about nutrients in each serving, including the grams (g) of carbohydrate per serving.  To use the "Nutrition Facts":  Decide how many servings you will have.  Multiply the number of servings by the number of carbohydrates per serving.  The resulting number is the total amount of carbohydrates that you will be having.  Learning standard serving sizes of other foods When you eat foods containing carbohydrates that are not packaged or do not include "Nutrition Facts" on the label, you need to measure the servings in order to count the amount of carbohydrates:  Measure the foods that you will eat with a food scale or  measuring cup, if needed.  Decide how many standard-size servings you will eat.  Multiply the number of servings by 15. Most carbohydrate-rich foods have about 15 g of carbohydrates per serving. ? For example, if you eat 8 oz (170 g) of strawberries, you will have eaten 2 servings and 30 g of carbohydrates (2 servings x 15 g = 30 g).  For foods that have more than one food mixed, such as soups and casseroles, you must count the carbohydrates in each food that is included.  The following list contains standard serving sizes of common carbohydrate-rich foods. Each of these servings has about 15 g of carbohydrates:   hamburger bun or  English muffin.   oz (15 mL) syrup.   oz (14 g) jelly.  1 slice of bread.  1 six-inch tortilla.  3 oz (85 g) cooked rice or pasta.  4 oz (113 g) cooked dried beans.  4 oz (113 g) starchy vegetable, such as peas, corn, or potatoes.  4 oz (113 g) hot cereal.  4 oz (113 g) mashed potatoes or  of a large baked potato.  4 oz (113 g) canned or frozen fruit.  4 oz (120 mL) fruit juice.  4-6 crackers.  6 chicken nuggets.  6 oz (170 g) unsweetened dry cereal.  6 oz (170 g) plain fat-free yogurt or yogurt sweetened with artificial sweeteners.  8 oz (240 mL) milk.  8 oz (170 g) fresh fruit or one small piece of fruit.  24 oz (680 g) popped popcorn.  Example of carbohydrate counting Sample meal  3 oz (85 g) chicken breast.    6 oz (170 g) brown rice.  4 oz (113 g) corn.  8 oz (240 mL) milk.  8 oz (170 g) strawberries with sugar-free whipped topping. Carbohydrate calculation 1. Identify the foods that contain carbohydrates: ? Rice. ? Corn. ? Milk. ? Strawberries. 2. Calculate how many servings you have of each food: ? 2 servings rice. ? 1 serving corn. ? 1 serving milk. ? 1 serving strawberries. 3. Multiply each number of servings by 15 g: ? 2 servings rice x 15 g = 30 g. ? 1 serving corn x 15 g = 15 g. ? 1 serving milk x 15  g = 15 g. ? 1 serving strawberries x 15 g = 15 g. 4. Add together all of the amounts to find the total grams of carbohydrates eaten: ? 30 g + 15 g + 15 g + 15 g = 75 g of carbohydrates total. This information is not intended to replace advice given to you by your health care provider. Make sure you discuss any questions you have with your health care provider. Document Released: 07/25/2005 Document Revised: 02/12/2016 Document Reviewed: 01/06/2016 Elsevier Interactive Patient Education  2018 [place] Maintenance, Female Adopting a healthy lifestyle and getting preventive care can go a long way to promote health and wellness. Talk with your health care provider about what schedule of regular examinations is right for you. This is a good chance for you to check in with your provider about disease prevention and staying healthy. In between checkups, there are plenty of things you can do on your own. Experts have done a lot of research about which lifestyle changes and preventive measures are most likely to keep you healthy. Ask your health care provider for more information. Weight and diet Eat a healthy diet  Be sure to include plenty of vegetables, fruits, low-fat dairy products, and lean protein.  Do not eat a lot of foods high in solid fats, added sugars, or salt.  Get regular exercise. This is one of the most important things you can do for your health. ? Most adults should exercise for at least 150 minutes each week. The exercise should increase your heart rate and make you sweat (moderate-intensity exercise). ? Most adults should also do strengthening exercises at least twice a week. This is in addition to the moderate-intensity exercise.  Maintain a healthy weight  Body mass index (BMI) is a measurement that can be used to identify possible weight problems. It estimates body fat based on height and weight. Your health care provider can help determine your BMI and help you  achieve or maintain a healthy weight.  For females 76 years of age and older: ? A BMI below 18.5 is considered underweight. ? A BMI of 18.5 to 24.9 is normal. ? A BMI of 25 to 29.9 is considered overweight. ? A BMI of 30 and above is considered obese.  Watch levels of cholesterol and blood lipids  You should start having your blood tested for lipids and cholesterol at 46 years of age, then have this test every 5 years.  You may need to have your cholesterol levels checked more often if: ? Your lipid or cholesterol levels are high. ? You are older than 46 years of age. ? You are at high risk for heart disease.  Cancer screening Lung Cancer  Lung cancer screening is recommended for adults 52-25 years old who are at high risk for lung cancer because of a history of smoking.  A  yearly low-dose CT scan of the lungs is recommended for people who: ? Currently smoke. ? Have quit within the past 15 years. ? Have at least a 30-pack-year history of smoking. A pack year is smoking an average of one pack of cigarettes a day for 1 year.  Yearly screening should continue until it has been 15 years since you quit.  Yearly screening should stop if you develop a health problem that would prevent you from having lung cancer treatment.  Breast Cancer  Practice breast self-awareness. This means understanding how your breasts normally appear and feel.  It also means doing regular breast self-exams. Let your health care provider know about any changes, no matter how small.  If you are in your 20s or 30s, you should have a clinical breast exam (CBE) by a health care provider every 1-3 years as part of a regular health exam.  If you are 4 or older, have a CBE every year. Also consider having a breast X-ray (mammogram) every year.  If you have a family history of breast cancer, talk to your health care provider about genetic screening.  If you are at high risk for breast cancer, talk to your health  care provider about having an MRI and a mammogram every year.  Breast cancer gene (BRCA) assessment is recommended for women who have family members with BRCA-related cancers. BRCA-related cancers include: ? Breast. ? Ovarian. ? Tubal. ? Peritoneal cancers.  Results of the assessment will determine the need for genetic counseling and BRCA1 and BRCA2 testing.  Cervical Cancer Your health care provider may recommend that you be screened regularly for cancer of the pelvic organs (ovaries, uterus, and vagina). This screening involves a pelvic examination, including checking for microscopic changes to the surface of your cervix (Pap test). You may be encouraged to have this screening done every 3 years, beginning at age 12.  For women ages 13-65, health care providers may recommend pelvic exams and Pap testing every 3 years, or they may recommend the Pap and pelvic exam, combined with testing for human papilloma virus (HPV), every 5 years. Some types of HPV increase your risk of cervical cancer. Testing for HPV may also be done on women of any age with unclear Pap test results.  Other health care providers may not recommend any screening for nonpregnant women who are considered low risk for pelvic cancer and who do not have symptoms. Ask your health care provider if a screening pelvic exam is right for you.  If you have had past treatment for cervical cancer or a condition that could lead to cancer, you need Pap tests and screening for cancer for at least 20 years after your treatment. If Pap tests have been discontinued, your risk factors (such as having a new sexual partner) need to be reassessed to determine if screening should resume. Some women have medical problems that increase the chance of getting cervical cancer. In these cases, your health care provider may recommend more frequent screening and Pap tests.  Colorectal Cancer  This type of cancer can be detected and often  prevented.  Routine colorectal cancer screening usually begins at 46 years of age and continues through 46 years of age.  Your health care provider may recommend screening at an earlier age if you have risk factors for colon cancer.  Your health care provider may also recommend using home test kits to check for hidden blood in the stool.  A small camera at the end of  a tube can be used to examine your colon directly (sigmoidoscopy or colonoscopy). This is done to check for the earliest forms of colorectal cancer.  Routine screening usually begins at age 45.  Direct examination of the colon should be repeated every 5-10 years through 46 years of age. However, you may need to be screened more often if early forms of precancerous polyps or small growths are found.  Skin Cancer  Check your skin from head to toe regularly.  Tell your health care provider about any new moles or changes in moles, especially if there is a change in a mole's shape or color.  Also tell your health care provider if you have a mole that is larger than the size of a pencil eraser.  Always use sunscreen. Apply sunscreen liberally and repeatedly throughout the day.  Protect yourself by wearing long sleeves, pants, a wide-brimmed hat, and sunglasses whenever you are outside.  Heart disease, diabetes, and high blood pressure  High blood pressure causes heart disease and increases the risk of stroke. High blood pressure is more likely to develop in: ? People who have blood pressure in the high end of the normal range (130-139/85-89 mm Hg). ? People who are overweight or obese. ? People who are African American.  If you are 65-44 years of age, have your blood pressure checked every 3-5 years. If you are 35 years of age or older, have your blood pressure checked every year. You should have your blood pressure measured twice-once when you are at a hospital or clinic, and once when you are not at a hospital or clinic.  Record the average of the two measurements. To check your blood pressure when you are not at a hospital or clinic, you can use: ? An automated blood pressure machine at a pharmacy. ? A home blood pressure monitor.  If you are between 55 years and 4 years old, ask your health care provider if you should take aspirin to prevent strokes.  Have regular diabetes screenings. This involves taking a blood sample to check your fasting blood sugar level. ? If you are at a normal weight and have a low risk for diabetes, have this test once every three years after 46 years of age. ? If you are overweight and have a high risk for diabetes, consider being tested at a younger age or more often. Preventing infection Hepatitis B  If you have a higher risk for hepatitis B, you should be screened for this virus. You are considered at high risk for hepatitis B if: ? You were born in a country where hepatitis B is common. Ask your health care provider which countries are considered high risk. ? Your parents were born in a high-risk country, and you have not been immunized against hepatitis B (hepatitis B vaccine). ? You have HIV or AIDS. ? You use needles to inject street drugs. ? You live with someone who has hepatitis B. ? You have had sex with someone who has hepatitis B. ? You get hemodialysis treatment. ? You take certain medicines for conditions, including cancer, organ transplantation, and autoimmune conditions.  Hepatitis C  Blood testing is recommended for: ? Everyone born from [birth year] through 1964. ? Anyone with known risk factors for hepatitis C.  Sexually transmitted infections (STIs)  You should be screened for sexually transmitted infections (STIs) including gonorrhea and chlamydia if: ? You are sexually active and are younger than 46 years of age. ? You are older than  46 years of age and your health care provider tells you that you are at risk for this type of infection. ? Your sexual  activity has changed since you were last screened and you are at an increased risk for chlamydia or gonorrhea. Ask your health care provider if you are at risk.  If you do not have HIV, but are at risk, it may be recommended that you take a prescription medicine daily to prevent HIV infection. This is called pre-exposure prophylaxis (PrEP). You are considered at risk if: ? You are sexually active and do not regularly use condoms or know the HIV status of your partner(s). ? You take drugs by injection. ? You are sexually active with a partner who has HIV.  Talk with your health care provider about whether you are at high risk of being infected with HIV. If you choose to begin PrEP, you should first be tested for HIV. You should then be tested every 3 months for as long as you are taking PrEP. Pregnancy  If you are premenopausal and you may become pregnant, ask your health care provider about preconception counseling.  If you may become pregnant, take 400 to 800 micrograms (mcg) of folic acid every day.  If you want to prevent pregnancy, talk to your health care provider about birth control (contraception). Osteoporosis and menopause  Osteoporosis is a disease in which the bones lose minerals and strength with aging. This can result in serious bone fractures. Your risk for osteoporosis can be identified using a bone density scan.  If you are 29 years of age or older, or if you are at risk for osteoporosis and fractures, ask your health care provider if you should be screened.  Ask your health care provider whether you should take a calcium or vitamin D supplement to lower your risk for osteoporosis.  Menopause may have certain physical symptoms and risks.  Hormone replacement therapy may reduce some of these symptoms and risks. Talk to your health care provider about whether hormone replacement therapy is right for you. Follow these instructions at home:  Schedule regular health, dental,  and eye exams.  Stay current with your immunizations.  Do not use any tobacco products including cigarettes, chewing tobacco, or electronic cigarettes.  If you are pregnant, do not drink alcohol.  If you are breastfeeding, limit how much and how often you drink alcohol.  Limit alcohol intake to no more than 1 drink per day for nonpregnant women. One drink equals 12 ounces of beer, 5 ounces of wine, or 1 ounces of hard liquor.  Do not use street drugs.  Do not share needles.  Ask your health care provider for help if you need support or information about quitting drugs.  Tell your health care provider if you often feel depressed.  Tell your health care provider if you have ever been abused or do not feel safe at home. This information is not intended to replace advice given to you by your health care provider. Make sure you discuss any questions you have with your health care provider. Document Released: 02/07/2011 Document Revised: 12/31/2015 Document Reviewed: 04/28/2015 Elsevier Interactive Patient Education  [employer].

## 2017-09-18 NOTE — Progress Notes (Signed)
Sherry Marquez Jul 20, 1971 47    History:    Presents for annual exam. Monthly cycle most months has had occasional missed cycle. Vasectomy. Normal Pap and mammogram history. GDM with first pregnancy.  Past medical history, past surgical history, family history and social history were all reviewed and documented in the EPIC chart. 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 2 sons, ages 11 and 77 oldest son has mild autism. Mother A. fib, father hypertension and heart disease.  ROS:  A ROS was performed and pertinent positives and negatives are included.  Exam:  Vitals:   09/18/17 0921  BP: 118/80  Weight: 201 lb (91.2 kg)  Height: 5' 5"  (1.651 m)   Body mass index is 33.45 kg/m.   General appearance:  Normal Thyroid:  Symmetrical, normal in size, without palpable masses or nodularity. Respiratory  Auscultation:  Clear without wheezing or rhonchi Cardiovascular  Auscultation:  Regular rate, without rubs, murmurs or gallops  Edema/varicosities:  Not grossly evident Abdominal  Soft,nontender, without masses, guarding or rebound.  Liver/spleen:  No organomegaly noted  Hernia:  None appreciated  Skin  Inspection:  Grossly normal   Breasts: Examined lying and sitting.     Right: Without masses, retractions, discharge or axillary adenopathy.     Left: Without masses, retractions, discharge or axillary adenopathy. Gentitourinary   Inguinal/mons:  Normal without inguinal adenopathy  External genitalia:  Normal  BUS/Urethra/Skene's glands:  Normal  Vagina:  Normal  Cervix:  Normal  Uterus: normal in size, shape and contour.  Midline and mobile  Adnexa/parametria:     Rt: Without masses or tenderness.   Lt: Without masses or tenderness.  Anus and perineum: Normal  Digital rectal exam: Normal sphincter tone without palpated masses or tenderness  Assessment/Plan:  47 y.o. MWF G3 P2  for annual exam with no complaints.  Mostly monthly cycle/vasectomy Obesity  Plan:  Aware of need to increase/continue regular exercise and decrease calories/carbs for weight loss. SBE's, continue annual screening mammogram, calcium rich diet, vitamin D 1000 daily encouraged. CBC, CMP, lipid panel, Pap normal with negative HR HPV 2018, new screening guidelines reviewed.  Huel Cote [facility], 9:51 AM 09/18/2017

## 2017-09-19 ENCOUNTER — Other Ambulatory Visit: Payer: Self-pay | Admitting: Women's Health

## 2017-09-19 DIAGNOSIS — E7800 Pure hypercholesterolemia, unspecified: Secondary | ICD-10-CM

## 2017-10-27 ENCOUNTER — Telehealth: Payer: Self-pay | Admitting: *Deleted

## 2017-10-27 NOTE — Telephone Encounter (Signed)
Pt informed with the below note, declined Rx for now, prefers to watch, will follow up if needed.

## 2017-10-27 NOTE — Telephone Encounter (Signed)
Looks like the march cycle was at normal time (had annual in Feb) may just be one unusual cycle, husband had vasectomy.  If occurs again next month best thing to do is a sonohysterogram to be sure nothing inside uterus causing such as a polyp.  If this is getting to bothersome she could take a home UPT and if neg we could give her provera 72m for 10 days to stop the bleeding. Watching or med ok options.  If she has questions I could call

## 2017-10-27 NOTE — Telephone Encounter (Signed)
Pt aware you are out of the office) pt called stating her cycle has been on since 10/07/17. Pt said she had 2 weeks of light bleeding changed tampon twice a day, then last night she had heavy bleeding inserted tampon and had to change 1 hour later, woke up from sleep and had to change again. This am flow has slowed down, this would be new to her, she has skipped cycles, but never had prolonged bleeding or heavy bleeding. Patient would like you recommendations. Please advise

## 2018-09-19 ENCOUNTER — Encounter: Payer: Self-pay | Admitting: Women's Health

## 2018-10-15 ENCOUNTER — Encounter: Payer: Self-pay | Admitting: Women's Health

## 2018-10-15 ENCOUNTER — Ambulatory Visit: Payer: 59 | Admitting: Women's Health

## 2018-10-15 VITALS — BP 130/80 | Ht 65.0 in | Wt 209.0 lb

## 2018-10-15 DIAGNOSIS — Z13220 Encounter for screening for lipoid disorders: Secondary | ICD-10-CM

## 2018-10-15 DIAGNOSIS — Z01419 Encounter for gynecological examination (general) (routine) without abnormal findings: Secondary | ICD-10-CM

## 2018-10-15 DIAGNOSIS — F419 Anxiety disorder, unspecified: Secondary | ICD-10-CM | POA: Diagnosis not present

## 2018-10-15 NOTE — Patient Instructions (Addendum)
Living With Anxiety  After being diagnosed with an anxiety disorder, you may be relieved to know why you have felt or behaved a certain way. It is natural to also feel overwhelmed about the treatment ahead and what it will mean for your life. With care and support, you can manage this condition and recover from it. How to cope with anxiety Dealing with stress Stress is your body's reaction to life changes and events, both good and bad. Stress can last just a few hours or it can be ongoing. Stress can play a major role in anxiety, so it is important to learn both how to cope with stress and how to think about it differently. Talk with your health care provider or a counselor to learn more about stress reduction. He or she may suggest some stress reduction techniques, such as:  Music therapy. This can include creating or listening to music that you enjoy and that inspires you.  Mindfulness-based meditation. This involves being aware of your normal breaths, rather than trying to control your breathing. It can be done while sitting or walking.  Centering prayer. This is a kind of meditation that involves focusing on a word, phrase, or sacred image that is meaningful to you and that brings you peace.  Deep breathing. To do this, expand your stomach and inhale slowly through your nose. Hold your breath for 3-5 seconds. Then exhale slowly, allowing your stomach muscles to relax.  Self-talk. This is a skill where you identify thought patterns that lead to anxiety reactions and correct those thoughts.  Muscle relaxation. This involves tensing muscles then relaxing them. Choose a stress reduction technique that fits your lifestyle and personality. Stress reduction techniques take time and practice. Set aside 5-15 minutes a day to do them. Therapists can offer training in these techniques. The training may be covered by some insurance plans. Other things you can do to manage stress include:  Keeping a  stress diary. This can help you learn what triggers your stress and ways to control your response.  Thinking about how you respond to certain situations. You may not be able to control everything, but you can control your reaction.  Making time for activities that help you relax, and not feeling guilty about spending your time in this way. Therapy combined with coping and stress-reduction skills provides the best chance for successful treatment. Medicines Medicines can help ease symptoms. Medicines for anxiety include:  Anti-anxiety drugs.  Antidepressants.  Beta-blockers. Medicines may be used as the main treatment for anxiety disorder, along with therapy, or if other treatments are not working. Medicines should be prescribed by a health care provider. Relationships Relationships can play a big part in helping you recover. Try to spend more time connecting with trusted friends and family members. Consider going to couples counseling, taking family education classes, or going to family therapy. Therapy can help you and others better understand the condition. How to recognize changes in your condition Everyone has a different response to treatment for anxiety. Recovery from anxiety happens when symptoms decrease and stop interfering with your daily activities at home or work. This may mean that you will start to:  Have better concentration and focus.  Sleep better.  Be less irritable.  Have more energy.  Have improved memory. It is important to recognize when your condition is getting worse. Contact your health care provider if your symptoms interfere with home or work and you do not feel like your condition is improving. Where  to find help and support: You can get help and support from these sources:  Self-help groups.  Online and [employer].  A trusted spiritual leader.  Couples counseling.  Family education classes.  Family therapy. Follow these instructions  at home:  Eat a healthy diet that includes plenty of vegetables, fruits, whole grains, low-fat dairy products, and lean protein. Do not eat a lot of foods that are high in solid fats, added sugars, or salt.  Exercise. Most adults should do the following: ? Exercise for at least 150 minutes each week. The exercise should increase your heart rate and make you sweat (moderate-intensity exercise). ? Strengthening exercises at least twice a week.  Cut down on caffeine, tobacco, alcohol, and other potentially harmful substances.  Get the right amount and quality of sleep. Most adults need 7-9 hours of sleep each night.  Make choices that simplify your life.  Take over-the-counter and prescription medicines only as told by your health care provider.  Avoid caffeine, alcohol, and certain over-the-counter cold medicines. These may make you feel worse. Ask your pharmacist which medicines to avoid.  Keep all follow-up visits as told by your health care provider. This is important. Questions to ask your health care provider  Would I benefit from therapy?  How often should I follow up with a health care provider?  How long do I need to take medicine?  Are there any long-term side effects of my medicine?  Are there any alternatives to taking medicine? Contact a health care provider if:  You have a hard time staying focused or finishing daily tasks.  You spend many hours a day feeling worried about everyday life.  You become exhausted by worry.  You start to have headaches, feel tense, or have nausea.  You urinate more than normal.  You have diarrhea. Get help right away if:  You have a racing heart and shortness of breath.  You have thoughts of hurting yourself or others. If you ever feel like you may hurt yourself or others, or have thoughts about taking your own life, get help right away. You can go to your nearest emergency department or call:  Your local emergency services  (911 in the [place]  A suicide crisis helpline, such as the [facility] at (262)571-1671. This is open 24-hours a day. Summary  Taking steps to deal with stress can help calm you.  Medicines cannot cure anxiety disorders, but they can help ease symptoms.  Family, friends, and partners can play a big part in helping you recover from an anxiety disorder. This information is not intended to replace advice given to you by your health care provider. Make sure you discuss any questions you have with your health care provider. Document Released: 07/19/2016 Document Revised: 07/19/2016 Document Reviewed: 07/19/2016 Elsevier Interactive Patient Education  2019 [place] Maintenance, Female Adopting a healthy lifestyle and getting preventive care can go a long way to promote health and wellness. Talk with your health care provider about what schedule of regular examinations is right for you. This is a good chance for you to check in with your provider about disease prevention and staying healthy. In between checkups, there are plenty of things you can do on your own. Experts have done a lot of research about which lifestyle changes and preventive measures are most likely to keep you healthy. Ask your health care provider for more information. Weight and diet Eat a healthy diet  Be sure to include  plenty of vegetables, fruits, low-fat dairy products, and lean protein.  Do not eat a lot of foods high in solid fats, added sugars, or salt.  Get regular exercise. This is one of the most important things you can do for your health. ? Most adults should exercise for at least 150 minutes each week. The exercise should increase your heart rate and make you sweat (moderate-intensity exercise). ? Most adults should also do strengthening exercises at least twice a week. This is in addition to the moderate-intensity exercise. Maintain a healthy weight  Body mass index (BMI)  is a measurement that can be used to identify possible weight problems. It estimates body fat based on height and weight. Your health care provider can help determine your BMI and help you achieve or maintain a healthy weight.  For females 40 years of age and older: ? A BMI below 18.5 is considered underweight. ? A BMI of 18.5 to 24.9 is normal. ? A BMI of 25 to 29.9 is considered overweight. ? A BMI of 30 and above is considered obese. Watch levels of cholesterol and blood lipids  You should start having your blood tested for lipids and cholesterol at 47 years of age, then have this test every 5 years.  You may need to have your cholesterol levels checked more often if: ? Your lipid or cholesterol levels are high. ? You are older than 47 years of age. ? You are at high risk for heart disease. Cancer screening Lung Cancer  Lung cancer screening is recommended for adults 69-83 years old who are at high risk for lung cancer because of a history of smoking.  A yearly low-dose CT scan of the lungs is recommended for people who: ? Currently smoke. ? Have quit within the past 15 years. ? Have at least a 30-pack-year history of smoking. A pack year is smoking an average of one pack of cigarettes a day for 1 year.  Yearly screening should continue until it has been 15 years since you quit.  Yearly screening should stop if you develop a health problem that would prevent you from having lung cancer treatment. Breast Cancer  Practice breast self-awareness. This means understanding how your breasts normally appear and feel.  It also means doing regular breast self-exams. Let your health care provider know about any changes, no matter how small.  If you are in your 20s or 30s, you should have a clinical breast exam (CBE) by a health care provider every 1-3 years as part of a regular health exam.  If you are 78 or older, have a CBE every year. Also consider having a breast X-ray (mammogram)  every year.  If you have a family history of breast cancer, talk to your health care provider about genetic screening.  If you are at high risk for breast cancer, talk to your health care provider about having an MRI and a mammogram every year.  Breast cancer gene (BRCA) assessment is recommended for women who have family members with BRCA-related cancers. BRCA-related cancers include: ? Breast. ? Ovarian. ? Tubal. ? Peritoneal cancers.  Results of the assessment will determine the need for genetic counseling and BRCA1 and BRCA2 testing. Cervical Cancer Your health care provider may recommend that you be screened regularly for cancer of the pelvic organs (ovaries, uterus, and vagina). This screening involves a pelvic examination, including checking for microscopic changes to the surface of your cervix (Pap test). You may be encouraged to have this screening  done every 3 years, beginning at age 74.  For women ages 17-65, health care providers may recommend pelvic exams and Pap testing every 3 years, or they may recommend the Pap and pelvic exam, combined with testing for human papilloma virus (HPV), every 5 years. Some types of HPV increase your risk of cervical cancer. Testing for HPV may also be done on women of any age with unclear Pap test results.  Other health care providers may not recommend any screening for nonpregnant women who are considered low risk for pelvic cancer and who do not have symptoms. Ask your health care provider if a screening pelvic exam is right for you.  If you have had past treatment for cervical cancer or a condition that could lead to cancer, you need Pap tests and screening for cancer for at least 20 years after your treatment. If Pap tests have been discontinued, your risk factors (such as having a new sexual partner) need to be reassessed to determine if screening should resume. Some women have medical problems that increase the chance of getting cervical cancer.  In these cases, your health care provider may recommend more frequent screening and Pap tests. Colorectal Cancer  This type of cancer can be detected and often prevented.  Routine colorectal cancer screening usually begins at 47 years of age and continues through 47 years of age.  Your health care provider may recommend screening at an earlier age if you have risk factors for colon cancer.  Your health care provider may also recommend using home test kits to check for hidden blood in the stool.  A small camera at the end of a tube can be used to examine your colon directly (sigmoidoscopy or colonoscopy). This is done to check for the earliest forms of colorectal cancer.  Routine screening usually begins at age 19.  Direct examination of the colon should be repeated every 5-10 years through 47 years of age. However, you may need to be screened more often if early forms of precancerous polyps or small growths are found. Skin Cancer  Check your skin from head to toe regularly.  Tell your health care provider about any new moles or changes in moles, especially if there is a change in a mole's shape or color.  Also tell your health care provider if you have a mole that is larger than the size of a pencil eraser.  Always use sunscreen. Apply sunscreen liberally and repeatedly throughout the day.  Protect yourself by wearing long sleeves, pants, a wide-brimmed hat, and sunglasses whenever you are outside. Heart disease, diabetes, and high blood pressure  High blood pressure causes heart disease and increases the risk of stroke. High blood pressure is more likely to develop in: ? People who have blood pressure in the high end of the normal range (130-139/85-89 mm Hg). ? People who are overweight or obese. ? People who are African American.  If you are 35-52 years of age, have your blood pressure checked every 3-5 years. If you are 67 years of age or older, have your blood pressure checked  every year. You should have your blood pressure measured twice-once when you are at a hospital or clinic, and once when you are not at a hospital or clinic. Record the average of the two measurements. To check your blood pressure when you are not at a hospital or clinic, you can use: ? An automated blood pressure machine at a pharmacy. ? A home blood pressure monitor.  If you are between 33 years and 70 years old, ask your health care provider if you should take aspirin to prevent strokes.  Have regular diabetes screenings. This involves taking a blood sample to check your fasting blood sugar level. ? If you are at a normal weight and have a low risk for diabetes, have this test once every three years after 47 years of age. ? If you are overweight and have a high risk for diabetes, consider being tested at a younger age or more often. Preventing infection Hepatitis B  If you have a higher risk for hepatitis B, you should be screened for this virus. You are considered at high risk for hepatitis B if: ? You were born in a country where hepatitis B is common. Ask your health care provider which countries are considered high risk. ? Your parents were born in a high-risk country, and you have not been immunized against hepatitis B (hepatitis B vaccine). ? You have HIV or AIDS. ? You use needles to inject street drugs. ? You live with someone who has hepatitis B. ? You have had sex with someone who has hepatitis B. ? You get hemodialysis treatment. ? You take certain medicines for conditions, including cancer, organ transplantation, and autoimmune conditions. Hepatitis C  Blood testing is recommended for: ? Everyone born from [birth year] through 1964. ? Anyone with known risk factors for hepatitis C. Sexually transmitted infections (STIs)  You should be screened for sexually transmitted infections (STIs) including gonorrhea and chlamydia if: ? You are sexually active and are younger than 47 years of  age. ? You are older than 47 years of age and your health care provider tells you that you are at risk for this type of infection. ? Your sexual activity has changed since you were last screened and you are at an increased risk for chlamydia or gonorrhea. Ask your health care provider if you are at risk.  If you do not have HIV, but are at risk, it may be recommended that you take a prescription medicine daily to prevent HIV infection. This is called pre-exposure prophylaxis (PrEP). You are considered at risk if: ? You are sexually active and do not regularly use condoms or know the HIV status of your partner(s). ? You take drugs by injection. ? You are sexually active with a partner who has HIV. Talk with your health care provider about whether you are at high risk of being infected with HIV. If you choose to begin PrEP, you should first be tested for HIV. You should then be tested every 3 months for as long as you are taking PrEP. Pregnancy  If you are premenopausal and you may become pregnant, ask your health care provider about preconception counseling.  If you may become pregnant, take 400 to 800 micrograms (mcg) of folic acid every day.  If you want to prevent pregnancy, talk to your health care provider about birth control (contraception). Osteoporosis and menopause  Osteoporosis is a disease in which the bones lose minerals and strength with aging. This can result in serious bone fractures. Your risk for osteoporosis can be identified using a bone density scan.  If you are 57 years of age or older, or if you are at risk for osteoporosis and fractures, ask your health care provider if you should be screened.  Ask your health care provider whether you should take a calcium or vitamin D supplement to lower your risk for osteoporosis.  Menopause may have  certain physical symptoms and risks.  Hormone replacement therapy may reduce some of these symptoms and risks. Talk to your health  care provider about whether hormone replacement therapy is right for you. Follow these instructions at home:  Schedule regular health, dental, and eye exams.  Stay current with your immunizations.  Do not use any tobacco products including cigarettes, chewing tobacco, or electronic cigarettes.  If you are pregnant, do not drink alcohol.  If you are breastfeeding, limit how much and how often you drink alcohol.  Limit alcohol intake to no more than 1 drink per day for nonpregnant women. One drink equals 12 ounces of beer, 5 ounces of wine, or 1 ounces of hard liquor.  Do not use street drugs.  Do not share needles.  Ask your health care provider for help if you need support or information about quitting drugs.  Tell your health care provider if you often feel depressed.  Tell your health care provider if you have ever been abused or do not feel safe at home. This information is not intended to replace advice given to you by your health care provider. Make sure you discuss any questions you have with your health care provider. Document Released: 02/07/2011 Document Revised: 12/31/2015 Document Reviewed: 04/28/2015 Elsevier Interactive Patient Education  2019 [employer].

## 2018-10-15 NOTE — Progress Notes (Signed)
Sherry Marquez Aug 11, 1970 [id number]    History:    Presents for annual exam.  Cycles mostly monthly occasional skipped cycles/vasectomy.  Normal Pap and mammogram history.  Reports  anxiety with depression but states is working through it, tearful when discussing.  Has had depression in the past, prefers no medication at this time. Denies any suicide ideation.  Past medical history, past surgical history, family history and social history were all reviewed and documented in the EPIC chart.  2 sons ages 47 and 62 both doing okay.  Mother A. fib.  Father hypertension.  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  GDM with first pregnancy.  ROS:  A ROS was performed and pertinent positives and negatives are included.  Exam:  Vitals:   10/15/18 0945  BP: 130/80  Weight: 209 lb (94.8 kg)  Height: 5' 5"  (1.651 m)   Body mass index is 34.78 kg/m.   General appearance:  Normal Thyroid:  Symmetrical, normal in size, without palpable masses or nodularity. Respiratory  Auscultation:  Clear without wheezing or rhonchi Cardiovascular  Auscultation:  Regular rate, without rubs, murmurs or gallops  Edema/varicosities:  Not grossly evident Abdominal  Soft,nontender, without masses, guarding or rebound.  Liver/spleen:  No organomegaly noted  Hernia:  None appreciated  Skin  Inspection:  Grossly normal   Breasts: Examined lying and sitting.     Right: Without masses, retractions, discharge or axillary adenopathy.     Left: Without masses, retractions, discharge or axillary adenopathy. Gentitourinary   Inguinal/mons:  Normal without inguinal adenopathy  External genitalia:  Normal  BUS/Urethra/Skene's glands:  Normal  Vagina:  Normal  Cervix:  Normal  Uterus:   normal in size, shape and contour.  Midline and mobile  Adnexa/parametria:     Rt: Without masses or tenderness.   Lt: Without masses or tenderness.  Anus and perineum: Normal  Digital rectal exam: Normal sphincter tone  without palpated masses or tenderness  Assessment/Plan:  48 y.o. MWF G3, P2 for annual exam with no GYN complaints.  Cycles mostly monthly/vasectomy Obesity Situational stress/depression/anxiety  Plan: Encouraged counseling, Marya Amsler number given instructed to schedule appointment.  Leisure activities, self-care and regular exercise encouraged..  Continue regular walking, SBEs, annual screening mammogram, calcium rich foods, vitamin D 1000 daily encouraged.  CBC, CMP, lipid panel, TSH, Pap normal 2018, new screening guidelines reviewed.    [place], 11:29 AM 10/15/2018

## 2018-10-16 LAB — COMPREHENSIVE METABOLIC PANEL
AG Ratio: 1.6 (calc) (ref 1.0–2.5)
ALT: 22 U/L (ref 6–29)
AST: 20 U/L (ref 10–35)
Albumin: 4.1 g/dL (ref 3.6–5.1)
Alkaline phosphatase (APISO): 63 U/L (ref 31–125)
BILIRUBIN TOTAL: 0.7 mg/dL (ref 0.2–1.2)
BUN: 12 mg/dL (ref 7–25)
CO2: 26 mmol/L (ref 20–32)
Calcium: 9.3 mg/dL (ref 8.6–10.2)
Chloride: 102 mmol/L (ref 98–110)
Creat: 0.84 mg/dL (ref 0.50–1.10)
GLUCOSE: 82 mg/dL (ref 65–99)
Globulin: 2.6 g/dL (calc) (ref 1.9–3.7)
POTASSIUM: 4.1 mmol/L (ref 3.5–5.3)
SODIUM: 137 mmol/L (ref 135–146)
TOTAL PROTEIN: 6.7 g/dL (ref 6.1–8.1)

## 2018-10-16 LAB — CBC WITH DIFFERENTIAL/PLATELET
Absolute Monocytes: 450 cells/uL (ref 200–950)
BASOS PCT: 0.7 %
Basophils Absolute: 40 cells/uL (ref 0–200)
EOS PCT: 1.2 %
Eosinophils Absolute: 68 cells/uL (ref 15–500)
HCT: 44.7 % (ref 35.0–45.0)
HEMOGLOBIN: 14.7 g/dL (ref 11.7–15.5)
Lymphs Abs: 1511 cells/uL (ref 850–3900)
MCH: 30.2 pg (ref 27.0–33.0)
MCHC: 32.9 g/dL (ref 32.0–36.0)
MCV: 92 fL (ref 80.0–100.0)
MONOS PCT: 7.9 %
MPV: 10.3 fL (ref 7.5–12.5)
NEUTROS ABS: 3631 {cells}/uL (ref 1500–7800)
Neutrophils Relative %: 63.7 %
PLATELETS: 319 10*3/uL (ref 140–400)
RBC: 4.86 10*6/uL (ref 3.80–5.10)
RDW: 12.3 % (ref 11.0–15.0)
TOTAL LYMPHOCYTE: 26.5 %
WBC: 5.7 10*3/uL (ref 3.8–10.8)

## 2018-10-16 LAB — LIPID PANEL
Cholesterol: 242 mg/dL — ABNORMAL HIGH (ref <200)
HDL: 57 mg/dL (ref >=50)
LDL CHOLESTEROL (CALC): 169 mg/dL — AB
NON-HDL CHOLESTEROL (CALC): 185 mg/dL — AB (ref <130)
Total CHOL/HDL Ratio: 4.2 (calc) (ref <5.0)
Triglycerides: 67 mg/dL (ref <150)

## 2018-10-16 LAB — TSH: TSH: 1.45 m[IU]/L

## 2018-10-30 NOTE — Telephone Encounter (Signed)
patient said Sherry Marquez called her with results last week and informed her.

## 2019-01-26 ENCOUNTER — Encounter: Payer: Self-pay | Admitting: Family Medicine

## 2019-01-26 ENCOUNTER — Telehealth (INDEPENDENT_AMBULATORY_CARE_PROVIDER_SITE_OTHER): Payer: 59 | Admitting: Family Medicine

## 2019-01-26 VITALS — Temp 98.0°F

## 2019-01-26 DIAGNOSIS — R399 Unspecified symptoms and signs involving the genitourinary system: Secondary | ICD-10-CM | POA: Insufficient documentation

## 2019-01-26 DIAGNOSIS — R102 Pelvic and perineal pain: Secondary | ICD-10-CM

## 2019-01-26 MED ORDER — CEPHALEXIN 500 MG PO CAPS: 500.0000 mg | ORAL_CAPSULE | Freq: Two times a day (BID) | ORAL | 0 refills | Status: DC

## 2019-01-26 NOTE — Progress Notes (Signed)
Virtual Visit via Video   Due to the COVID-19 pandemic, this visit was completed with telemedicine (audio/video) technology to reduce patient and provider exposure as well as to preserve personal protective equipment.   I connected with Sherry Marquez by a video enabled telemedicine application and verified that I am speaking with the correct person using two identifiers. Location patient: Home Location provider: [facility], Office Persons participating in the virtual visit: Haydee Monica, MD   I discussed the limitations of evaluation and management by telemedicine and the availability of in person appointments. The patient expressed understanding and agreed to proceed.  Care Team   Patient Care Team: Lucretia Kern, DO as PCP - General (Family Medicine)  Subjective:   HPI:   Patient presents to weekend clinic, new to me.  She is a patient of Dr. Maudie Mercury. She presents with the complaint of lower abdominal pain.  For past week, has been having suprapubic pain on right side towards her hip.  Initially felt like menstrual cramps.  Has been exercising but no known injury. Had not had sex week prior to this.  Pain level is about 3 currently. Has been taking Advil which does help.  No dysuria.  Increased urgency.  Has had intermittent temps- tmax 101.  No back pain, no nausea, no vomiting.  Pain has not moved, no history of kidney stones.  No hematuria. Cannot remember the last time she had a UTI.  Review of Systems  Gastrointestinal: Positive for abdominal pain.  All other systems reviewed and are negative.    Patient Active Problem List   Diagnosis Date Noted  . Foot pain - pes planus 10/29/2013  . Seasonal allergies 10/29/2013  . Depressed mood 10/29/2013  . HYPERLIPIDEMIA 08/20/2009    Social History   Tobacco Use  . Smoking status: Never Smoker  . Smokeless tobacco: Never Used  Substance Use Topics  . Alcohol use: Yes    Comment: occasional     Current Outpatient Medications:  .  cephALEXin (KEFLEX) 500 MG capsule, Take 1 capsule (500 mg total) by mouth 2 (two) times daily., Disp: 14 capsule, Rfl: 0 .  cetirizine (ZYRTEC) 10 MG tablet, Take 10 mg by mouth daily., Disp: , Rfl:  .  Cholecalciferol (VITAMIN D PO), Take 1 tablet by mouth daily., Disp: , Rfl:  .  Ibuprofen (ADVIL PO), Take by mouth as needed., Disp: , Rfl:   No Known Allergies  Objective:  Temp 98 F (36.7 C)   VITALS: Per patient if applicable, see vitals. GENERAL: Alert, appears well and in no acute distress. HEENT: Atraumatic, conjunctiva clear, no obvious abnormalities on inspection of external nose and ears. NECK: Normal movements of the head and neck. CARDIOPULMONARY: No increased WOB. Speaking in clear sentences. I:E ratio WNL.  MS: Moves all visible extremities without noticeable abnormality. PSYCH: Pleasant and cooperative, well-groomed. Speech normal rate and rhythm. Affect is appropriate. Insight and judgement are appropriate. Attention is focused, linear, and appropriate.  NEURO: CN grossly intact. Oriented as arrived to appointment on time with no prompting. Moves both UE equally.  SKIN: No obvious lesions, wounds, erythema, or cyanosis noted on face or hands.  No flowsheet data found.  Assessment and Plan:   There are no diagnoses linked to this encounter.  Marland Kitchen COVID-19 Education: The signs and symptoms of COVID-19 were discussed with the patient and how to seek care for testing if needed. The importance of social distancing was discussed today. . Reviewed  expectations re: course of current medical issues. . Discussed self-management of symptoms. . Outlined signs and symptoms indicating need for more acute intervention. . Patient verbalized understanding and all questions were answered. Marland Kitchen Health Maintenance issues including appropriate healthy diet, exercise, and smoking avoidance were discussed with patient. . See orders for this visit as  documented in the electronic medical record.  Arnette Norris, MD  Records requested if needed. Time spent: 25 minutes, of which >50% was spent in obtaining information about her symptoms, reviewing her previous labs, evaluations, and treatments, counseling her about her condition (please see the discussed topics above), and developing a plan to further investigate it; she had a number of questions which I addressed.

## 2019-01-26 NOTE — Assessment & Plan Note (Signed)
>  25 minutes spent in face to face time with patient, >50% spent in counselling or coordination of care discussing her symptoms and treatment options.  Since I cannot bring her into weekend clinic for a UA, we agreed to treat empirically for a UTI/pyelo (given her fever) with Keflex 500 mg twice daily x 7 days.  Advised to keep pushing fluids.  Sent her a Pharmacist, community message with these instructions as well.  Cannot rule out kidney stones but seems less likely this point.  I gave her my cell phone number to call me over the weekend if symptoms worsen or do not improve. Follow up with PCP next week. The patient indicates understanding of these issues and agrees with the plan.

## 2019-01-29 ENCOUNTER — Telehealth: Payer: Self-pay | Admitting: Family Medicine

## 2019-01-29 ENCOUNTER — Ambulatory Visit (INDEPENDENT_AMBULATORY_CARE_PROVIDER_SITE_OTHER): Payer: 59 | Admitting: Family Medicine

## 2019-01-29 DIAGNOSIS — R1030 Lower abdominal pain, unspecified: Secondary | ICD-10-CM

## 2019-01-29 DIAGNOSIS — R109 Unspecified abdominal pain: Secondary | ICD-10-CM | POA: Insufficient documentation

## 2019-01-29 NOTE — Progress Notes (Signed)
TELEPHONE ENCOUNTER   Patient verbally agreed to telephone visit and is aware that copayment and coinsurance may apply. Patient was treated using telemedicine according to accepted telemedicine protocols.  Location of the patient: home  Location of provider: provider's home Names of all persons participating in the telemedicine service and role in the encounter: Arnette Norris, MD Miki Kins  Subjective:   Chief Complaint  Patient presents with  . Abdominal Pain     HPI   Abdominal pain-  Patient of Dr. Maudie Mercury who I saw in weekend clinic on 01/26/19 by virtual visit.  Note reviewed.  At that time, she presented with suprapubic pain on the right without injury.  No dysuria but did have increased urgency.  No gross hematuria.  She did have a temperature with a Tmax of 101.  Advili was helping a little with the pain.  Pain was not moving AT THAT TIME and no known history of kidney stones.  No flank pain, nausea or vomiting.  Since I could not bring her into weekend clinic for a UA, we agreed to treat empirically for a UTI/pyelo (given her fever) with Keflex 500 mg twice daily x 7 days.  Advised to keep pushing fluids.  Sent her a Pharmacist, community message with these instructions as well.  Cannot rule out kidney stones but seems less likely this point.  I gave her my cell phone number to call me over the weekend if symptoms worsen or do not improve. Also advised to follow up with PCP this week.  She just called me saying that her symptoms have changed.  Keflex has not helped.  Pain has moved to bilateral lower abdomen.  Somewhat painful to touch but not excruciating.  Bowels have changed- no diarrhea (just feels she can't get more than little pieces out and she is not constipated).  No blood in her stool.  Dad has h/o recurrent diverticulitis.  Patient Active Problem List   Diagnosis Date Noted  . Abdominal pain 01/29/2019  . Urinary tract infection symptoms 01/26/2019  . Foot pain - pes  planus 10/29/2013  . Seasonal allergies 10/29/2013  . Depressed mood 10/29/2013  . HYPERLIPIDEMIA 08/20/2009   Social History   Tobacco Use  . Smoking status: Never Smoker  . Smokeless tobacco: Never Used  Substance Use Topics  . Alcohol use: Yes    Comment: occasional    Current Outpatient Medications:  .  cephALEXin (KEFLEX) 500 MG capsule, Take 1 capsule (500 mg total) by mouth 2 (two) times daily., Disp: 14 capsule, Rfl: 0 .  cetirizine (ZYRTEC) 10 MG tablet, Take 10 mg by mouth daily., Disp: , Rfl:  .  Cholecalciferol (VITAMIN D PO), Take 1 tablet by mouth daily., Disp: , Rfl:  .  Ibuprofen (ADVIL PO), Take by mouth as needed., Disp: , Rfl:  No Known Allergies  Assessment & Plan:   1. Lower abdominal pain     Orders Placed This Encounter  Procedures  . CT RENAL STONE STUDY   No orders of the defined types were placed in this encounter.   Arnette Norris, MD 01/29/2019  Time spent with the patient: 11  minutes, spent in obtaining information about her symptoms, reviewing her previous labs, evaluations, and treatments, counseling her about her condition (please see the discussed topics above), and developing a plan to further investigate it; she had a number of questions which I addressed.   [id number] physician/qualified health professional telephone evaluation 5 to 10 minutes [id number] physician/qualified help functional  [place] evaluation for 11 to 20 minutes [id number] physician/qualify he will professional telephone evaluation for 21 to 30 minutes

## 2019-01-29 NOTE — Assessment & Plan Note (Signed)
Now with changing locations and symptoms.  Some of GI symptoms could be related to abx but seems less likely.  At this point, I consider it appropriate to get a renal stone CT- rule out renal stones and diverticulitis, etc. Order entered. The patient indicates understanding of these issues and agrees with the plan.  Orders Placed This Encounter  Procedures  . CT RENAL STONE STUDY

## 2019-01-30 ENCOUNTER — Telehealth: Payer: Self-pay

## 2019-01-30 ENCOUNTER — Ambulatory Visit
Admission: RE | Admit: 2019-01-30 | Discharge: 2019-01-30 | Disposition: A | Discharge status: Home / Self Care | Payer: 59 | Source: Ambulatory Visit | Attending: Family Medicine | Admitting: Family Medicine

## 2019-01-30 ENCOUNTER — Other Ambulatory Visit: Payer: Self-pay | Admitting: Family Medicine

## 2019-01-30 DIAGNOSIS — N83201 Unspecified ovarian cyst, right side: Secondary | ICD-10-CM | POA: Insufficient documentation

## 2019-01-30 DIAGNOSIS — R1030 Lower abdominal pain, unspecified: Secondary | ICD-10-CM

## 2019-01-30 MED ORDER — IOPAMIDOL (ISOVUE-300) INJECTION 61%
100.0000 mL | Freq: Once | INTRAVENOUS | Status: AC | PRN
  Administered 2019-01-30: 100 mL via INTRAVENOUS

## 2019-01-30 NOTE — Telephone Encounter (Signed)
TA-Plz see call report below/thx dmf

## 2019-01-30 NOTE — Telephone Encounter (Signed)
Dr. Deborra Medina please advise  [facility] imaging called to report:  #1 normal appendix #2  5.1 by 4.6 CM  Complex cystic ovarian mass. 27 hounsfield units. Concerning Hemorrhagic cyst. Incomplete on scan. Mild adjacent inflammation changes. Recommend further characteristic testing with pelvic US.

## 2019-01-30 NOTE — Telephone Encounter (Signed)
Results discussed with patient.  Stat labs and pelvic/vaginal [place] ordered for tomorrow. The patient indicates understanding of these issues and agrees with the plan.  Can you find out where we are in scheduling her ultrasound please?

## 2019-01-31 ENCOUNTER — Other Ambulatory Visit: Payer: 59

## 2019-01-31 ENCOUNTER — Other Ambulatory Visit: Payer: Self-pay | Admitting: Family Medicine

## 2019-01-31 ENCOUNTER — Other Ambulatory Visit: Payer: Self-pay

## 2019-01-31 DIAGNOSIS — N83201 Unspecified ovarian cyst, right side: Secondary | ICD-10-CM

## 2019-01-31 DIAGNOSIS — R7989 Other specified abnormal findings of blood chemistry: Secondary | ICD-10-CM

## 2019-01-31 NOTE — Telephone Encounter (Signed)
Of course, Orvil Feil.  You'd do the same for mine.  No I don't think she has requested a transfer.

## 2019-01-31 NOTE — Telephone Encounter (Signed)
Thank you for caring for Sherry Marquez. Let us know if there is anything we can do to assist. Has she opted to transfer to you?

## 2019-02-01 ENCOUNTER — Telehealth: Payer: Self-pay | Admitting: *Deleted

## 2019-02-01 ENCOUNTER — Ambulatory Visit
Admission: RE | Admit: 2019-02-01 | Discharge: 2019-02-01 | Disposition: A | Discharge status: Home / Self Care | Payer: 59 | Source: Ambulatory Visit | Attending: Family Medicine | Admitting: Family Medicine

## 2019-02-01 ENCOUNTER — Telehealth: Payer: Self-pay

## 2019-02-01 DIAGNOSIS — N83201 Unspecified ovarian cyst, right side: Secondary | ICD-10-CM

## 2019-02-01 LAB — CBC WITH DIFFERENTIAL/PLATELET
Basophils Absolute: 0 10*3/uL (ref 0.0–0.2)
Basos: 0 %
EOS (ABSOLUTE): 0 10*3/uL (ref 0.0–0.4)
Eos: 0 %
Hematocrit: 38 % (ref 34.0–46.6)
Hemoglobin: 12.7 g/dL (ref 11.1–15.9)
Immature Grans (Abs): 0 10*3/uL (ref 0.0–0.1)
Immature Granulocytes: 0 %
Lymphocytes Absolute: 1.2 10*3/uL (ref 0.7–3.1)
Lymphs: 12 %
MCH: 30.5 pg (ref 26.6–33.0)
MCHC: 33.4 g/dL (ref 31.5–35.7)
MCV: 91 fL (ref 79–97)
Monocytes Absolute: 0.7 10*3/uL (ref 0.1–0.9)
Monocytes: 7 %
Neutrophils Absolute: 8 10*3/uL — ABNORMAL HIGH (ref 1.4–7.0)
Neutrophils: 81 %
Platelets: 603 10*3/uL — ABNORMAL HIGH (ref 150–450)
RBC: 4.16 x10E6/uL (ref 3.77–5.28)
RDW: 12.5 % (ref 11.7–15.4)
WBC: 9.9 10*3/uL (ref 3.4–10.8)

## 2019-02-01 LAB — FERRITIN: Ferritin: 265 ng/mL — ABNORMAL HIGH (ref 15–150)

## 2019-02-01 NOTE — Telephone Encounter (Signed)
TA-I called and spoke with Rosemarie Ax who said she would talk with Dr. Wayne Both regarding this urgent matter/I advised her of the verbal impression and that I had faxed it and received a confirmation/she said she would talk with the doctor and call the patient/thx dmf

## 2019-02-01 NOTE — Telephone Encounter (Signed)
Assuming the patient is not in significant pain I would recommend follow-up ultrasound here in 6 to 8 weeks.  I would manage discomfort with ibuprofen 800 mg 3 times daily as needed.  #30.  I would avoid immediate surgery as this may resolve on its own.  Certainly if it persists at some point we would have to make a surgical decision.

## 2019-02-01 NOTE — Telephone Encounter (Signed)
You are back up MD) Sharyn Lull (nurse)  called from [place] office patient had pelvic ultrasound today (result in epic) asked for MD to review. Please advise

## 2019-02-01 NOTE — Telephone Encounter (Signed)
Patient informed with the below note, she will use OTC medication for discomfort, no significant pain now as this time. Order placed for ultrasound in 6 weeks. I will route call to [facility] as well so she is aware of new findings.

## 2019-02-04 NOTE — Telephone Encounter (Addendum)
Sherry Marquez patient is confused do you want to have ultrasound after next cycle? Or wait 6 weeks as recommended by Dr. Phineas Real see the below message? Patient just had ultrasound at [facility] on 02/01/19.

## 2019-02-04 NOTE — Telephone Encounter (Signed)
Sherry Marquez patient called back and asked what to expect if the cyst was to burst? Also she declined the Rx for ibuprofen 800 mg tablet, she takes Advil and asked if safe take 3 times daily as needed until 6 week follow up u/s? Please advise

## 2019-02-04 NOTE — Telephone Encounter (Signed)
[place]orea is best after a cycle, may not be exactly 6 weeks but not to soon but she is having pain

## 2019-02-04 NOTE — Telephone Encounter (Signed)
TC will keep scheduled [place]  Appt.  Needs Korea after next cycle, 5 cm cyst

## 2019-02-05 NOTE — Telephone Encounter (Signed)
Thanks

## 2019-02-05 NOTE — Telephone Encounter (Signed)
Will route to claudia to schedule

## 2019-02-05 NOTE — Telephone Encounter (Signed)
I think dmf meant to sent this to you. Thank you.

## 2019-02-05 NOTE — Progress Notes (Signed)
cbc

## 2019-02-05 NOTE — Addendum Note (Signed)
Addended by: Agnes Lawrence on: 02/05/2019 10:26 AM   Modules accepted: Orders

## 2019-02-05 NOTE — Telephone Encounter (Signed)
Spoke with patient she will call to schedule ultrasound after her August menstrual cycle.

## 2019-02-06 ENCOUNTER — Other Ambulatory Visit: Payer: 59

## 2019-02-19 ENCOUNTER — Other Ambulatory Visit: Payer: 59

## 2019-02-25 ENCOUNTER — Other Ambulatory Visit (INDEPENDENT_AMBULATORY_CARE_PROVIDER_SITE_OTHER): Payer: 59

## 2019-02-25 ENCOUNTER — Other Ambulatory Visit: Payer: Self-pay

## 2019-02-25 DIAGNOSIS — R7989 Other specified abnormal findings of blood chemistry: Secondary | ICD-10-CM

## 2019-02-25 LAB — CBC WITH DIFFERENTIAL/PLATELET
Basophils Absolute: 0 10*3/uL (ref 0.0–0.1)
Basophils Relative: 0.6 % (ref 0.0–3.0)
Eosinophils Absolute: 0.1 10*3/uL (ref 0.0–0.7)
Eosinophils Relative: 1.2 % (ref 0.0–5.0)
HCT: 39.9 % (ref 36.0–46.0)
Hemoglobin: 13.3 g/dL (ref 12.0–15.0)
Lymphocytes Relative: 20.9 % (ref 12.0–46.0)
Lymphs Abs: 1.6 10*3/uL (ref 0.7–4.0)
MCHC: 33.3 g/dL (ref 30.0–36.0)
MCV: 90.3 fl (ref 78.0–100.0)
Monocytes Absolute: 0.5 10*3/uL (ref 0.1–1.0)
Monocytes Relative: 7.2 % (ref 3.0–12.0)
Neutro Abs: 5.3 10*3/uL (ref 1.4–7.7)
Neutrophils Relative %: 70.1 % (ref 43.0–77.0)
Platelets: 369 10*3/uL (ref 150.0–400.0)
RBC: 4.42 Mil/uL (ref 3.87–5.11)
RDW: 13.7 % (ref 11.5–15.5)
WBC: 7.6 10*3/uL (ref 4.0–10.5)

## 2019-03-18 ENCOUNTER — Telehealth: Payer: Self-pay | Admitting: *Deleted

## 2019-03-18 NOTE — Telephone Encounter (Signed)
Please call and review best to get an ultrasound after a menstrual cycle because lining of the uterus will be thinner and able to see more.  Also if doing okay it is okay to wait.

## 2019-03-18 NOTE — Telephone Encounter (Signed)
Patient called to follow up from telephone encounter 02/01/19. Reports was told to schedule ultrasound after next cycle, LMP:01/09/19 states she doesn't have cycle every month asked okay to schedule ultrasound now this would be around the 6 week mark as notes in telephone encounter? She has been doing well, only had achy feeling last night. Please advise

## 2019-03-18 NOTE — Telephone Encounter (Signed)
Patient informed. 

## 2019-03-28 ENCOUNTER — Telehealth: Payer: Self-pay | Admitting: *Deleted

## 2019-03-28 ENCOUNTER — Other Ambulatory Visit: Payer: Self-pay

## 2019-03-28 NOTE — Telephone Encounter (Signed)
Patient called recently treated for UTI with tele-visit from another MD. Patient said UTI symptoms feel better with medication, but right ovarian cyst discomfort is starting to return again, patient is taking Advil which is helping, was told to schedule ultrasound to look at this area after next cycle per Izora Gala. I explained if symptoms should worsen before the weekend to schedule OV.

## 2019-03-29 ENCOUNTER — Encounter: Payer: Self-pay | Admitting: Gynecology

## 2019-03-29 ENCOUNTER — Ambulatory Visit (INDEPENDENT_AMBULATORY_CARE_PROVIDER_SITE_OTHER): Payer: 59 | Admitting: Gynecology

## 2019-03-29 VITALS — BP 122/80

## 2019-03-29 DIAGNOSIS — G8929 Other chronic pain: Secondary | ICD-10-CM | POA: Diagnosis not present

## 2019-03-29 DIAGNOSIS — N83201 Unspecified ovarian cyst, right side: Secondary | ICD-10-CM | POA: Diagnosis not present

## 2019-03-29 DIAGNOSIS — R1031 Right lower quadrant pain: Secondary | ICD-10-CM

## 2019-03-29 DIAGNOSIS — N912 Amenorrhea, unspecified: Secondary | ICD-10-CM

## 2019-03-29 MED ORDER — IBUPROFEN 800 MG PO TABS: 800.0000 mg | ORAL_TABLET | Freq: Three times a day (TID) | ORAL | 1 refills | Status: DC | PRN

## 2019-03-29 NOTE — Patient Instructions (Signed)
Follow-up for the ultrasound as scheduled.

## 2019-03-29 NOTE — Progress Notes (Signed)
    Sherry Marquez Aug 07, 1971 [id number]        48 y.o.  [id number] presents with right lower quadrant pain.  Initially presented to her primary end of June with right lower quadrant pain.  CT scan showed 5.1 x 4.6 cm complex cystic right ovarian mass consistent with hemorrhagic cyst.  Had follow-up ultrasound which showed 5.2 x 5 cm heterogeneous echogenic mass encompassing nearly the entire of the right ovary.  No internal Doppler flow.  She notes that her pain seemed to have gotten better since then but now over the past week or so has returned.  Currently being treated for UTI.  No fever or chills nausea vomiting diarrhea constipation.  Past medical history,surgical history, problem list, medications, allergies, family history and social history were all reviewed and documented in the EPIC chart.  Directed ROS with pertinent positives and negatives documented in the history of present illness/assessment and plan.  Exam: Sherry Marquez assistant Vitals:   03/29/19 1431  BP: 122/80   General appearance:  Normal Abdomen soft nontender without masses guarding rebound Pelvic external BUS vagina normal.  Cervix normal.  Uterus palpates retroverted somewhat globoid.  No clear differentiation of right adnexal mass.  No significant tenderness.  No left adnexal mass or tenderness.  Assessment/Plan:  48 y.o. [id number] with history of right ovarian hemorrhagic appearing cyst at 5 cm.  Pain transiently improved but now seems to be returning.  Exam without clearly palpable right adnexal mass but uterus appears globoid and question whether mass part of feeling the uterus.  No acute changes on exam to suggest torsion.  Will check baseline CBC.  Also late for her menses with LMP in June.  Vasectomy birth control.  Check hCG FSH TSH.  We will follow-up for ultrasound now as scheduled.  Various scenarios to include surgery versus observation discussed.    Sherry Auerbach MD, 3:05 PM 03/29/2019

## 2019-03-30 LAB — CBC WITH DIFFERENTIAL/PLATELET
Absolute Monocytes: 690 cells/uL (ref 200–950)
Basophils Absolute: 40 cells/uL (ref 0–200)
Basophils Relative: 0.4 %
Eosinophils Absolute: 0 cells/uL — ABNORMAL LOW (ref 15–500)
Eosinophils Relative: 0 %
HCT: 39.2 % (ref 35.0–45.0)
Hemoglobin: 13.2 g/dL (ref 11.7–15.5)
Lymphs Abs: 1630 cells/uL (ref 850–3900)
MCH: 29.5 pg (ref 27.0–33.0)
MCHC: 33.7 g/dL (ref 32.0–36.0)
MCV: 87.7 fL (ref 80.0–100.0)
MPV: 10.3 fL (ref 7.5–12.5)
Monocytes Relative: 6.9 %
Neutro Abs: 7640 cells/uL (ref 1500–7800)
Neutrophils Relative %: 76.4 %
Platelets: 374 10*3/uL (ref 140–400)
RBC: 4.47 10*6/uL (ref 3.80–5.10)
RDW: 12.8 % (ref 11.0–15.0)
Total Lymphocyte: 16.3 %
WBC: 10 10*3/uL (ref 3.8–10.8)

## 2019-03-30 LAB — FOLLICLE STIMULATING HORMONE: FSH: 69.1 m[IU]/mL

## 2019-03-30 LAB — HCG, SERUM, QUALITATIVE: Preg, Serum: NEGATIVE

## 2019-03-30 LAB — TSH: TSH: 1.91 mIU/L

## 2019-04-01 ENCOUNTER — Encounter: Payer: Self-pay | Admitting: Gynecology

## 2019-04-01 ENCOUNTER — Other Ambulatory Visit: Payer: Self-pay | Admitting: Gynecology

## 2019-04-01 ENCOUNTER — Ambulatory Visit: Payer: 59 | Admitting: Gynecology

## 2019-04-01 ENCOUNTER — Other Ambulatory Visit: Payer: Self-pay

## 2019-04-01 ENCOUNTER — Ambulatory Visit (INDEPENDENT_AMBULATORY_CARE_PROVIDER_SITE_OTHER): Payer: 59

## 2019-04-01 VITALS — BP 122/78

## 2019-04-01 DIAGNOSIS — N838 Other noninflammatory disorders of ovary, fallopian tube and broad ligament: Secondary | ICD-10-CM

## 2019-04-01 DIAGNOSIS — N83201 Unspecified ovarian cyst, right side: Secondary | ICD-10-CM | POA: Diagnosis not present

## 2019-04-01 DIAGNOSIS — R1900 Intra-abdominal and pelvic swelling, mass and lump, unspecified site: Secondary | ICD-10-CM

## 2019-04-01 DIAGNOSIS — N9489 Other specified conditions associated with female genital organs and menstrual cycle: Secondary | ICD-10-CM | POA: Diagnosis not present

## 2019-04-01 NOTE — Patient Instructions (Signed)
Office will call with the CA 125 results.  We will then formulate a game plan.

## 2019-04-01 NOTE — Progress Notes (Signed)
    Sherry Marquez 09-10-1970 [id number]        48 y.o.  [id number] presents for follow-up ultrasound.  History of right-sided pain.  CT scan end of June showed 5.1 x 4.6 cm complex cystic right ovarian mass consistent with a hemorrhagic cyst.  Follow-up ultrasound showed a 5.2 x 5 cm heterogeneous echogenic mass with no internal Doppler flow.  Recent blood work shows negative hCG, normal TSH but elevated FSH at 48.  The patient does note that her mother and sister underwent menopause in their mid 70s.  Past medical history,surgical history, problem list, medications, allergies, family history and social history were all reviewed and documented in the EPIC chart.  Directed ROS with pertinent positives and negatives documented in the history of present illness/assessment and plan.  Exam: Vitals:   04/01/19 1027  BP: 122/78   General appearance:  Normal  Ultrasound transvaginal and transabdominal shows uterus normal size with endometrial echo 4.3 mm.  Right ovary with rim of tissue with thin-walled cystic mass with low-level internal echoes measuring 65 mm mean.  Negative color flow Doppler to the cyst.  Positive arterial blood flow to the ovary.  Left ovary not visualized.  Right adnexa noted free fluid at 98 x 64 x 40 mm.  Assessment/Plan:  48 y.o. [id number] with persistent right ovarian cystic mass with a low level echogenicity consistent with hemorrhage versus endometrioma.  We again discussed the differential to include resolving hemorrhagic cyst, endometrioma, other neoplastic process.  Options for management reviewed to include surgery now with cystectomy/oophorectomy versus watching a little longer with follow-up surgery if persists.  We also discussed her elevated FSH which would explain her amenorrhea.  Recommend check baseline CA 125 today.  We discussed the false positive and false negative issues with screening particularly if endometriosis or hemorrhagic changes may lead to a low level  elevation.  She wants to discuss her options with her husband and then follow-up with me.   Anastasio Auerbach MD, 10:48 AM 04/01/2019

## 2019-04-03 ENCOUNTER — Telehealth: Payer: Self-pay

## 2019-04-03 LAB — CA 125: CA 125: 13 U/mL (ref <35)

## 2019-04-03 NOTE — Telephone Encounter (Signed)
Patient informed. She said she does not know what she will do but wants another day or so to think about it. Will call when she decides.

## 2019-04-03 NOTE — Telephone Encounter (Signed)
Patient informed of result. She asked does she still have the option of waiting and watching?  Wanted to see what you recommended althought she said she thought it was up to her.

## 2019-04-03 NOTE — Telephone Encounter (Signed)
Option is surgery now versus waiting.  I think it is her choice either way.  If she decides to wait then I would follow-up ultrasound in 1 to 2 months

## 2019-04-03 NOTE — Telephone Encounter (Signed)
-----   Message from Anastasio Auerbach, MD sent at 04/03/2019 12:31 PM EDT ----- Tell patient the ovarian cancer blood test was normal/negative

## 2019-04-16 ENCOUNTER — Telehealth: Payer: Self-pay

## 2019-04-16 NOTE — Telephone Encounter (Signed)
Surgery slip sent

## 2019-04-16 NOTE — Telephone Encounter (Signed)
Patient has decided she wants to proceed with surgery to remove cyst from ovary.

## 2019-04-17 ENCOUNTER — Telehealth: Payer: Self-pay

## 2019-04-17 ENCOUNTER — Other Ambulatory Visit: Payer: Self-pay

## 2019-04-17 DIAGNOSIS — N83201 Unspecified ovarian cyst, right side: Secondary | ICD-10-CM

## 2019-04-17 NOTE — Telephone Encounter (Signed)
That is always the disclaimer that we may remove the ovary and tube on that side.  We also will discuss preoperatively about removing the fallopian tube on her other side as a risk reductive prophylactic surgery.  I did discuss with her previously about doing an ultrasound right before the surgery to make sure the cyst is still there.  I will leave that up to her if she is having pain then laparoscopy regardless is probably not a bad idea but she may want an ultrasound right before to make sure the cyst is still present before we do surgery.

## 2019-04-17 NOTE — Telephone Encounter (Signed)
I called patient to schedule surgery. We discussed her ins benefits and her estimated surgery prepymt due by one week prior to surgery. Financial letter will be mailed.    I scheduled her for Monday 10/12 at 10:00am at [facility].  Advised to check in 2 hours early. [facility] pamphlet will be mailed. I scheduled her for Covid screen accordingly and advised her to quarantine from time of screen until surgery only going out for medical visits/med emergency. Covid screen sheet will be mailed.  Pre op appt scheduled for 10/7 at 8:30am with Dr. Phineas Real.

## 2019-04-17 NOTE — Telephone Encounter (Signed)
Patient asked if something looked wrong with ovary would you go ahead and remove it?  Did you want to add poss RSO? Thought I would ask before I schedule. thanks

## 2019-04-17 NOTE — Telephone Encounter (Signed)
Read patient the disclaimer. U/s Order placed. Sherry Marquez will call her to schedule u/s prior to surgery.

## 2019-04-30 ENCOUNTER — Encounter: Payer: Self-pay | Admitting: Gynecology

## 2019-05-08 ENCOUNTER — Other Ambulatory Visit: Payer: Self-pay

## 2019-05-09 ENCOUNTER — Other Ambulatory Visit: Payer: Self-pay

## 2019-05-09 ENCOUNTER — Ambulatory Visit (INDEPENDENT_AMBULATORY_CARE_PROVIDER_SITE_OTHER): Payer: 59

## 2019-05-09 ENCOUNTER — Ambulatory Visit: Payer: 59 | Admitting: Gynecology

## 2019-05-09 ENCOUNTER — Encounter: Payer: Self-pay | Admitting: Gynecology

## 2019-05-09 VITALS — BP 124/80

## 2019-05-09 DIAGNOSIS — N83201 Unspecified ovarian cyst, right side: Secondary | ICD-10-CM | POA: Diagnosis not present

## 2019-05-09 DIAGNOSIS — Z01818 Encounter for other preprocedural examination: Secondary | ICD-10-CM

## 2019-05-09 NOTE — Progress Notes (Signed)
Sherry Marquez 1971-02-26 [id number]        48 y.o.  [id number] presents for her preop consult for upcoming laparoscopic right ovarian cystectomy bilateral salpingectomy possible right oophorectomy.  She initially presented with pain 01/2019 in the right lower quadrant.  CT scan showed a 5.1 x 4.6 cm complex cystic right ovarian mass consistent with a hemorrhagic cyst.  She had a follow-up ultrasound which showed a 5.2 x 5 cm heterogenous echogenic mass encompassing nearly the entire right ovary.  Negative internal Doppler flow.  Over time her pain has improved although she continues to have nagging right sided pain taking over-the-counter pain medication daily.  Follow-up ultrasound 03/2019 showed the right ovary with rim of tissue in the cystic mass with low-level internal echoes measuring 65 mm mean.  Negative color flow to the cyst.  Her menses have become irregular and [place] returned elevated at 69.  CA 125 was 13.   Ultrasound today shows normal-appearing uterus with endometrial echo 4.3 mm.  Left ovary is normal in appearance with 23 mm simple follicle.  Right ovary shows a cystic mass with internal debris measuring 4.7 x 4.4 x 4.1 cm consistent with endometrioma.  Cul-de-sac negative.  Past medical history,surgical history, problem list, medications, allergies, family history and social history were all reviewed and documented in the EPIC chart.  Directed ROS with pertinent positives and negatives documented in the history of present illness/assessment and plan.  Exam: Caryn Bee assistant Vitals:   05/09/19 0837  BP: 124/80   General appearance:  Normal HEENT normal Lungs clear Cardiac regular rate no rubs murmurs or gallops Abdomen soft nontender without mass guarding rebound Pelvic external BUS vagina normal.  Cervix normal.  Uterus grossly normal midline mobile.  Adnexa without gross masses or significant tenderness.  Assessment/Plan:  48 y.o. [id number] with history of right lower  quadrant pain over the past 4 months.  Has significantly decreased in severity but still persists as a nagging pain.  Has a persistent right ovarian cystic mass with low-level echoes suggesting endometrioma.  Possible resolving hemorrhagic cyst also reviewed.  Recent menstrual regularity with FSH elevated at 69.  Discussed options with the patient to include continued observation given the pain is getting better although not gone and it does appear that she is starting menopause.  If endometriosis then this may significantly improve symptom wise as she transitions through menopause.  Other alternative is to proceed with laparoscopy now for pelvic surveillance and to remove the cyst if possible with a realistic understanding of a possible right salpingo-oophorectomy.  Also to proceed with a laparoscopic bilateral salpingectomy for risk reductive surgery for "ovarian cancer".  She currently is using vasectomy birth control and understands the absolute irreversible sterility with the salpingectomy.  We also discussed whether to remove both ovaries given it does appear she is transitioning into menopause but the patient strongly wants to keep her ovaries if possible or at least her left ovary for a smooth transition through menopause excepting the risks of ovarian cancer in the future.  She also understands that if we find significant disease that we may abort the procedure and rediscuss postoperatively management options.  She understands there are no guarantees as far as removing all pathology and relief of her pain.  The expected intraoperative and postoperative courses as well as the recovery period were reviewed. The risks of infection, prolonged antibiotics, reoperation for abscess or hematoma formation was discussed. The risks of hemorrhage necessitating transfusion and the risks  of transfusion reaction, hepatitis, HIV, mad cow disease and other unknown entities was also discussed. Incisional complications to  include opening and draining of incisions and closure by secondary intention, dehiscence and long-term issues of keloid/cosmetics and hernia formation were reviewed. The risk of inadvertent injury to internal organs including bowel, bladder, ureters, vessels, nerves either immediately recognized or delay recognized necessitating major exploratory reparative surgeries and future reparative surgeries including bowel resection, ostomy formation, bladder repair, ureteral damage repair was discussed with her. The patient's questions were answered to her satisfaction and she is ready to proceed with surgery.    Anastasio Auerbach MD, 9:12 AM 05/09/2019

## 2019-05-09 NOTE — H&P (Signed)
Sherry Marquez [birth year] [id number]   History and Physical  Chief complaint: Persistent right ovarian mass.  Persistent right lower quadrant pain.  History of present illness: 48 y.o. [id number] for laparoscopic right ovarian cystectomy bilateral salpingectomy possible right oophorectomy.  She initially presented with pain 01/2019 in the right lower quadrant.  CT scan showed a 5.1 x 4.6 cm complex cystic right ovarian mass consistent with a hemorrhagic cyst.  She had a follow-up ultrasound which showed a 5.2 x 5 cm heterogenous echogenic mass encompassing nearly the entire right ovary.  Negative internal Doppler flow.  Over time her pain has improved although she continues to have nagging right sided pain taking over-the-counter pain medication daily.  Follow-up ultrasound 03/2019 showed the right ovary with rim of tissue in the cystic mass with low-level internal echoes measuring 65 mm mean.  Negative color flow to the cyst.  Her menses have become irregular and [place] returned elevated at 69.  CA 125 was 13. Ultrasound 05/09/2019 showed normal-appearing uterus with endometrial echo 4.3 mm.  Left ovary is normal in appearance with 23 mm simple follicle.  Right ovary shows a cystic mass with internal debris measuring 4.7 x 4.4 x 4.1 cm consistent with endometrioma.  Cul-de-sac negative.  Past Medical History:  Diagnosis Date  . Allergy   . Eczema   . LUMBAR SPRAIN AND STRAIN 12/05/2009   Qualifier: Diagnosis of  By: Sarajane Jews MD, Ishmael Holter     Past Surgical History:  Procedure Laterality Date  . CESAREAN SECTION    . WISDOM TOOTH EXTRACTION  2001    Family History  Problem Relation Age of Onset  . Heart disease Father   . Hypertension Father   . Hypertension Brother   . Atrial fibrillation Mother     Social History:  reports that she has never smoked. She has never used smokeless tobacco. She reports current alcohol use. She reports that she does not use drugs.  Allergies: No Known  Allergies  Medications: See Epic for the most current list of medications.  ROS:  Was performed and pertinent positives and negatives are included in the history of present illness.  Exam:  Sherry Marquez assistant Vitals:   05/09/19 0837  BP: 124/80   General appearance:  Normal HEENT normal Lungs clear Cardiac regular rate no rubs murmurs or gallops Abdomen soft nontender without mass guarding rebound Pelvic external BUS vagina normal.  Cervix normal.  Uterus grossly normal midline mobile.  Adnexa without gross masses or significant tenderness.    Assessment/Plan:  48 y.o. [name] with history of right lower quadrant pain over the past 4 months.  Has significantly decreased in severity but still persists as a nagging pain.  Has a persistent right ovarian cystic mass with low-level echoes suggesting endometrioma.  Possible resolving hemorrhagic cyst also reviewed.  Recent menstrual regularity with FSH elevated at 69.  Discussed options with the patient to include continued observation given the pain is getting better although not gone and it does appear that she is starting menopause.  If endometriosis then this may significantly improve symptom wise as she transitions through menopause.  Other alternative is to proceed with laparoscopy now for pelvic surveillance and to remove the cyst if possible with a realistic understanding of a possible right salpingo-oophorectomy.  Also to proceed with a laparoscopic bilateral salpingectomy for risk reductive surgery for "ovarian cancer".  She currently is using vasectomy birth control and understands the absolute irreversible sterility with the salpingectomy.  We also discussed  whether to remove both ovaries given it does appear she is transitioning into menopause but the patient strongly wants to keep her ovaries if possible or at least her left ovary for a smooth transition through menopause excepting the risks of ovarian cancer in the future.  She also  understands that if we find significant disease that we may abort the procedure and rediscuss postoperatively management options.  She understands there are no guarantees as far as removing all pathology and relief of her pain.  The expected intraoperative and postoperative courses as well as the recovery period were reviewed. The risks of infection, prolonged antibiotics, reoperation for abscess or hematoma formation was discussed. The risks of hemorrhage necessitating transfusion and the risks of transfusion reaction, hepatitis, HIV, mad cow disease and other unknown entities was also discussed. Incisional complications to include opening and draining of incisions and closure by secondary intention, dehiscence and long-term issues of keloid/cosmetics and hernia formation were reviewed. The risk of inadvertent injury to internal organs including bowel, bladder, ureters, vessels, nerves either immediately recognized or delay recognized necessitating major exploratory reparative surgeries and future reparative surgeries including bowel resection, ostomy formation, bladder repair, ureteral damage repair was discussed with her. The patient's questions were answered to her satisfaction and she is ready to proceed with surgery.    Sherry Auerbach MD, 9:54 AM 05/09/2019

## 2019-05-09 NOTE — Patient Instructions (Signed)
Follow-up for surgery as scheduled.  Call the office if any questions.

## 2019-05-14 NOTE — Progress Notes (Signed)
I have not seen Sherry Marquez in several years. Please let her know I am no longer doing primary care and see if she has a new PCP to update in Epic. If she wishes to continue with [name] please schedule a virtual visit with one of our providers taking new patients. Thanks!

## 2019-05-15 ENCOUNTER — Other Ambulatory Visit: Payer: Self-pay

## 2019-05-15 ENCOUNTER — Ambulatory Visit: Payer: 59 | Admitting: Gynecology

## 2019-05-15 ENCOUNTER — Encounter (HOSPITAL_BASED_OUTPATIENT_CLINIC_OR_DEPARTMENT_OTHER): Payer: Self-pay | Admitting: *Deleted

## 2019-05-15 NOTE — Progress Notes (Signed)
Spoke w/ via phone for pre-op interview--- PT Lab needs dos----  CBC, urine preg COVID test ------ 05-16-2019 Arrive at -------  0800 NPO after ------  MN Medications to take morning of surgery --- Zyrtec Diabetic medication ----- n/a Patient Special Instructions ----- n/a Pre-Op special Istructions ----- n/a  Patient verbalized understanding of instructions that were given at this phone interview. Patient denies shortness of breath, chest pain, fever, cough a this phone interview.

## 2019-05-16 ENCOUNTER — Other Ambulatory Visit (HOSPITAL_COMMUNITY)
Admission: RE | Admit: 2019-05-16 | Discharge: 2019-05-16 | Disposition: A | Discharge status: Home / Self Care | Payer: 59 | Source: Ambulatory Visit | Attending: Gynecology | Admitting: Gynecology

## 2019-05-16 DIAGNOSIS — Z20828 Contact with and (suspected) exposure to other viral communicable diseases: Secondary | ICD-10-CM | POA: Insufficient documentation

## 2019-05-16 DIAGNOSIS — Z01812 Encounter for preprocedural laboratory examination: Secondary | ICD-10-CM | POA: Insufficient documentation

## 2019-05-17 LAB — NOVEL CORONAVIRUS, NAA (HOSP ORDER, SEND-OUT TO REF LAB; TAT 18-24 HRS): SARS-CoV-2, NAA: NOT DETECTED

## 2019-05-20 ENCOUNTER — Encounter (HOSPITAL_BASED_OUTPATIENT_CLINIC_OR_DEPARTMENT_OTHER)
Admission: RE | Disposition: A | Discharge status: Home / Self Care | Payer: Self-pay | Source: Home / Self Care | Attending: Gynecology

## 2019-05-20 ENCOUNTER — Encounter (HOSPITAL_BASED_OUTPATIENT_CLINIC_OR_DEPARTMENT_OTHER): Payer: Self-pay | Admitting: Anesthesiology

## 2019-05-20 ENCOUNTER — Ambulatory Visit (HOSPITAL_BASED_OUTPATIENT_CLINIC_OR_DEPARTMENT_OTHER): Payer: 59 | Admitting: Anesthesiology

## 2019-05-20 ENCOUNTER — Other Ambulatory Visit: Payer: Self-pay

## 2019-05-20 ENCOUNTER — Ambulatory Visit (HOSPITAL_BASED_OUTPATIENT_CLINIC_OR_DEPARTMENT_OTHER)
Admission: RE | Admit: 2019-05-20 | Discharge: 2019-05-20 | Disposition: A | Discharge status: Home / Self Care | Payer: 59 | Attending: Gynecology | Admitting: Gynecology

## 2019-05-20 DIAGNOSIS — Z6833 Body mass index (BMI) 33.0-33.9, adult: Secondary | ICD-10-CM | POA: Insufficient documentation

## 2019-05-20 DIAGNOSIS — R1909 Other intra-abdominal and pelvic swelling, mass and lump: Secondary | ICD-10-CM

## 2019-05-20 DIAGNOSIS — R1031 Right lower quadrant pain: Secondary | ICD-10-CM | POA: Insufficient documentation

## 2019-05-20 DIAGNOSIS — N83201 Unspecified ovarian cyst, right side: Secondary | ICD-10-CM

## 2019-05-20 DIAGNOSIS — E669 Obesity, unspecified: Secondary | ICD-10-CM | POA: Insufficient documentation

## 2019-05-20 DIAGNOSIS — N736 Female pelvic peritoneal adhesions (postinfective): Secondary | ICD-10-CM

## 2019-05-20 HISTORY — DX: Personal history of gestational diabetes: Z86.32

## 2019-05-20 HISTORY — DX: Unspecified ovarian cyst, right side: N83.201

## 2019-05-20 HISTORY — PX: LAPAROSCOPY: SHX197

## 2019-05-20 HISTORY — DX: Other allergic rhinitis: J30.89

## 2019-05-20 LAB — CBC
HCT: 45.2 % (ref 36.0–46.0)
Hemoglobin: 14.6 g/dL (ref 12.0–15.0)
MCH: 28.5 pg (ref 26.0–34.0)
MCHC: 32.3 g/dL (ref 30.0–36.0)
MCV: 88.3 fL (ref 80.0–100.0)
Platelets: 418 10*3/uL — ABNORMAL HIGH (ref 150–400)
RBC: 5.12 MIL/uL — ABNORMAL HIGH (ref 3.87–5.11)
RDW: 13.4 % (ref 11.5–15.5)
WBC: 8.2 10*3/uL (ref 4.0–10.5)
nRBC: 0 % (ref 0.0–0.2)

## 2019-05-20 LAB — POCT PREGNANCY, URINE: Preg Test, Ur: NEGATIVE

## 2019-05-20 SURGERY — LAPAROSCOPY, DIAGNOSTIC
Anesthesia: General | Site: Abdomen

## 2019-05-20 MED ORDER — SODIUM CHLORIDE 0.9 % IR SOLN
Status: DC | PRN
  Administered 2019-05-20: 1000 mL

## 2019-05-20 MED ORDER — ONDANSETRON HCL 4 MG/2ML IJ SOLN
INTRAMUSCULAR | Status: DC | PRN
  Administered 2019-05-20: 4 mg via INTRAVENOUS

## 2019-05-20 MED ORDER — PROPOFOL 10 MG/ML IV BOLUS
INTRAVENOUS | Status: DC | PRN
  Administered 2019-05-20: 100 mg via INTRAVENOUS
  Administered 2019-05-20: 200 mg via INTRAVENOUS

## 2019-05-20 MED ORDER — KETOROLAC TROMETHAMINE 30 MG/ML IJ SOLN
INTRAMUSCULAR | Status: AC
  Filled 2019-05-20: qty 1

## 2019-05-20 MED ORDER — OXYCODONE-ACETAMINOPHEN 5-325 MG PO TABS: 1.0000 | ORAL_TABLET | ORAL | 0 refills | Status: DC | PRN

## 2019-05-20 MED ORDER — LIDOCAINE 2% (20 MG/ML) 5 ML SYRINGE
INTRAMUSCULAR | Status: DC | PRN
  Administered 2019-05-20: 100 mg via INTRAVENOUS

## 2019-05-20 MED ORDER — PROMETHAZINE HCL 25 MG/ML IJ SOLN
6.2500 mg | INTRAMUSCULAR | Status: DC | PRN
  Filled 2019-05-20: qty 1

## 2019-05-20 MED ORDER — LACTATED RINGERS IV SOLN
INTRAVENOUS | Status: DC
  Administered 2019-05-20: 09:00:00 via INTRAVENOUS
  Filled 2019-05-20: qty 1000

## 2019-05-20 MED ORDER — SODIUM CHLORIDE 0.9 % IV SOLN
INTRAVENOUS | Status: AC
  Filled 2019-05-20: qty 2

## 2019-05-20 MED ORDER — SUGAMMADEX SODIUM 200 MG/2ML IV SOLN
INTRAVENOUS | Status: DC | PRN
  Administered 2019-05-20: 200 mg via INTRAVENOUS

## 2019-05-20 MED ORDER — SODIUM CHLORIDE 0.9 % IV SOLN
2.0000 g | INTRAVENOUS | Status: AC
  Administered 2019-05-20: 1 g via INTRAVENOUS
  Filled 2019-05-20: qty 2

## 2019-05-20 MED ORDER — ROCURONIUM BROMIDE 50 MG/5ML IV SOSY
PREFILLED_SYRINGE | INTRAVENOUS | Status: DC | PRN
  Administered 2019-05-20: 50 mg via INTRAVENOUS

## 2019-05-20 MED ORDER — FENTANYL CITRATE (PF) 100 MCG/2ML IJ SOLN
25.0000 ug | INTRAMUSCULAR | Status: DC | PRN
  Filled 2019-05-20: qty 1

## 2019-05-20 MED ORDER — FENTANYL CITRATE (PF) 100 MCG/2ML IJ SOLN
INTRAMUSCULAR | Status: AC
  Filled 2019-05-20: qty 2

## 2019-05-20 MED ORDER — ROCURONIUM BROMIDE 10 MG/ML (PF) SYRINGE
PREFILLED_SYRINGE | INTRAVENOUS | Status: AC
  Filled 2019-05-20: qty 10

## 2019-05-20 MED ORDER — OXYCODONE HCL 5 MG PO TABS
5.0000 mg | ORAL_TABLET | Freq: Once | ORAL | Status: DC | PRN
  Filled 2019-05-20: qty 1

## 2019-05-20 MED ORDER — BUPIVACAINE HCL 0.25 % IJ SOLN
INTRAMUSCULAR | Status: DC | PRN
  Administered 2019-05-20: 10 mL

## 2019-05-20 MED ORDER — PROPOFOL 10 MG/ML IV BOLUS
INTRAVENOUS | Status: AC
  Filled 2019-05-20: qty 20

## 2019-05-20 MED ORDER — ONDANSETRON HCL 4 MG/2ML IJ SOLN
INTRAMUSCULAR | Status: AC
  Filled 2019-05-20: qty 2

## 2019-05-20 MED ORDER — FENTANYL CITRATE (PF) 100 MCG/2ML IJ SOLN
INTRAMUSCULAR | Status: DC | PRN
  Administered 2019-05-20 (×2): 50 ug via INTRAVENOUS

## 2019-05-20 MED ORDER — MIDAZOLAM HCL 2 MG/2ML IJ SOLN
INTRAMUSCULAR | Status: AC
  Filled 2019-05-20: qty 2

## 2019-05-20 MED ORDER — DIPHENHYDRAMINE HCL 50 MG/ML IJ SOLN
INTRAMUSCULAR | Status: DC | PRN
  Administered 2019-05-20: 25 mg via INTRAVENOUS

## 2019-05-20 MED ORDER — MIDAZOLAM HCL 5 MG/5ML IJ SOLN
INTRAMUSCULAR | Status: DC | PRN
  Administered 2019-05-20: 2 mg via INTRAVENOUS

## 2019-05-20 MED ORDER — DEXAMETHASONE SODIUM PHOSPHATE 10 MG/ML IJ SOLN
INTRAMUSCULAR | Status: DC | PRN
  Administered 2019-05-20: 10 mg via INTRAVENOUS

## 2019-05-20 MED ORDER — KETOROLAC TROMETHAMINE 30 MG/ML IJ SOLN
INTRAMUSCULAR | Status: DC | PRN
  Administered 2019-05-20: 30 mg via INTRAVENOUS

## 2019-05-20 MED ORDER — LIDOCAINE 2% (20 MG/ML) 5 ML SYRINGE
INTRAMUSCULAR | Status: AC
  Filled 2019-05-20: qty 5

## 2019-05-20 MED ORDER — DEXAMETHASONE SODIUM PHOSPHATE 10 MG/ML IJ SOLN
INTRAMUSCULAR | Status: AC
  Filled 2019-05-20: qty 1

## 2019-05-20 MED ORDER — OXYCODONE HCL 5 MG/5ML PO SOLN
5.0000 mg | Freq: Once | ORAL | Status: DC | PRN
  Filled 2019-05-20: qty 5

## 2019-05-20 SURGICAL SUPPLY — 53 items
ADH SKN CLS APL DERMABOND .7 (GAUZE/BANDAGES/DRESSINGS) ×2
APL SRG 38 LTWT LNG FL B (MISCELLANEOUS)
APPLICATOR ARISTA FLEXITIP XL (MISCELLANEOUS) IMPLANT
APPLICATOR COTTON TIP 6IN STRL (MISCELLANEOUS) ×3 IMPLANT
BAG LAPAROSCOPIC 12 15 PORT 16 (BASKET) IMPLANT
BAG RETRIEVAL 12/15 (BASKET)
BARRIER ADHS 3X4 INTERCEED (GAUZE/BANDAGES/DRESSINGS) IMPLANT
BRR ADH 4X3 ABS CNTRL BYND (GAUZE/BANDAGES/DRESSINGS)
CANISTER SUCT 3000ML PPV (MISCELLANEOUS) IMPLANT
CATH ROBINSON RED A/P 16FR (CATHETERS) ×2 IMPLANT
CONT SPEC 4OZ CLIKSEAL STRL BL (MISCELLANEOUS) IMPLANT
COVER MAYO STAND STRL (DRAPES) ×3 IMPLANT
COVER WAND RF STERILE (DRAPES) ×3 IMPLANT
DERMABOND ADVANCED (GAUZE/BANDAGES/DRESSINGS) ×1
DERMABOND ADVANCED .7 DNX12 (GAUZE/BANDAGES/DRESSINGS) ×2 IMPLANT
DRSG COVADERM PLUS 2X2 (GAUZE/BANDAGES/DRESSINGS) IMPLANT
DRSG OPSITE POSTOP 3X4 (GAUZE/BANDAGES/DRESSINGS) IMPLANT
DURAPREP 26ML APPLICATOR (WOUND CARE) ×3 IMPLANT
FILTER SMOKE EVAC LAPAROSHD (FILTER) IMPLANT
GAUZE 4X4 16PLY RFD (DISPOSABLE) ×3 IMPLANT
GLOVE BIO SURGEON STRL SZ 6.5 (GLOVE) IMPLANT
GLOVE BIO SURGEON STRL SZ7.5 (GLOVE) ×6 IMPLANT
GLOVE BIOGEL PI IND STRL 7.0 (GLOVE) IMPLANT
GLOVE BIOGEL PI INDICATOR 7.0 (GLOVE)
GOWN STRL REUS W/TWL LRG LVL3 (GOWN DISPOSABLE) IMPLANT
GOWN STRL REUS W/TWL XL LVL3 (GOWN DISPOSABLE) ×3 IMPLANT
HEMOSTAT ARISTA ABSORB 3G PWDR (HEMOSTASIS) IMPLANT
IV NS IRRIG 3000ML ARTHROMATIC (IV SOLUTION) IMPLANT
KIT TURNOVER CYSTO (KITS) ×3 IMPLANT
NDL SAFETY ECLIPSE 18X1.5 (NEEDLE) IMPLANT
NEEDLE HYPO 18GX1.5 SHARP (NEEDLE)
NS IRRIG 500ML POUR BTL (IV SOLUTION) ×3 IMPLANT
PACK LAPAROSCOPY BASIN (CUSTOM PROCEDURE TRAY) ×3 IMPLANT
PAD OB MATERNITY 4.3X12.25 (PERSONAL CARE ITEMS) ×3 IMPLANT
PAD PREP 24X48 CUFFED NSTRL (MISCELLANEOUS) ×3 IMPLANT
SCISSORS LAP 5X35 DISP (ENDOMECHANICALS) IMPLANT
SET IRRIG TUBING LAPAROSCOPIC (IRRIGATION / IRRIGATOR) ×5 IMPLANT
SHEARS HARMONIC ACE PLUS 36CM (ENDOMECHANICALS) ×3 IMPLANT
SOLUTION ELECTROLUBE (MISCELLANEOUS) ×3 IMPLANT
SUT MNCRL AB 4-0 PS2 18 (SUTURE) ×8 IMPLANT
SUT VICRYL 0 UR6 27IN ABS (SUTURE) ×5 IMPLANT
SYR 3ML 23GX1 SAFETY (SYRINGE) IMPLANT
SYR 50ML LL SCALE MARK (SYRINGE) IMPLANT
SYS BAG RETRIEVAL 10MM (BASKET)
SYSTEM BAG RETRIEVAL 10MM (BASKET) IMPLANT
TOWEL OR 17X26 10 PK STRL BLUE (TOWEL DISPOSABLE) ×6 IMPLANT
TRAY FOLEY W/BAG SLVR 14FR (SET/KITS/TRAYS/PACK) IMPLANT
TROCAR BLADELESS OPT 12M 100M (ENDOMECHANICALS) IMPLANT
TROCAR BLADELESS OPT 5 100 (ENDOMECHANICALS) ×6 IMPLANT
TROCAR XCEL NON-BLD 11X100MML (ENDOMECHANICALS) ×3 IMPLANT
TUBING EVAC SMOKE HEATED PNEUM (TUBING) ×3 IMPLANT
WARMER LAPAROSCOPE (MISCELLANEOUS) ×3 IMPLANT
WATER STERILE IRR 500ML POUR (IV SOLUTION) IMPLANT

## 2019-05-20 NOTE — Op Note (Signed)
Sherry Marquez [birth year] [id number]   Post Operative Note   Date of surgery:  05/20/2019  Pre Op Dx: Pelvic pain, persistent right ovarian cyst  Post Op Dx: Pelvic pain, persistent right ovarian cyst, inflammatory mass right adnexa  Procedure: Diagnostic laparoscopy  Surgeon:  Anastasio Auerbach  Assistant:  Dellis Filbert, ML  Anesthesia:  General  EBL: 20 cc  Complications:  None  Specimen: Peritoneal washings to pathology  Findings: EUA: External BUS vagina normal.  Cervix normal.  Uterus midline somewhat fixed to manipulation grossly normal size.  Adnexa without gross palpable masses   Operative: Anterior cul-de-sac with vesicouterine adhesions right side.  Midline to right posterior cul-de-sac unable to visualize due to sigmoid colon firmly adherent to the posterior uterus.  Uterus grossly normal size.  Right adnexa with firm 7 cm mass involving posterior uterine surface and right pelvic sidewall.  Fallopian tube and ovary not visualized.  Epiploica adhesions to this area noted.  Question of appendix adherent to this area although could not clearly identify appendix.  Large clear inflammatory type cyst covering this area extending to the upper pelvis drained.  Cell washing sent.  Left adnexa normal with normal-appearing fallopian tube and ovary free and mobile.  No evidence of active endometriosis on any surface noted.  Upper abdominal exam showed liver smooth with gallbladder grossly normal.  No upper abdominal adhesions noted.  Procedure: The patient was taken to the operating room, was placed in the low dorsolithotomy position and underwent general anesthesia without difficulty.  The timeout was performed by the surgical team.  The patient received an abdominal and vaginal preparation and a Hulka tenaculum was placed through the cervix for uterine manipulation.  The bladder was emptied with an in and out Foley catheterization and the patient was draped in the usual fashion.  A vertical  infraumbilical incision was made and using the 10 mm direct entry trocar the abdomen was directly entered under direct visualization without difficulty and subsequently insufflated.  Right and left 5 mm suprapubic ports were then placed under direct visualization after transillumination for the vessels without difficulty.  Examination of the pelvic organs and upper abdominal exam was carried out with findings noted above.  The large inflammatory cyst was drained of clear yellow fluid which subsequently was sent for washings.  Initial attempts to free some of the epiploica from this area were made and it became evident due to the thick adhesions with difficulty discerning tissue planes that the prudent option was to terminate the procedure and rediscuss options postoperatively with the patient and her husband.  The pelvis was irrigated with adequate hemostasis visualized and the right and left 5 mm ports were removed under direct visualization showing adequate hemostasis and no evidence of hernia formation.  The gas was then allowed to escape and the infraumbilical port was then backed out under direct visualization showing adequate hemostasis and no evidence of hernia formation.  All skin incisions were injected using 0.25% Marcaine and the infraumbilical fascia was reapproximated using 0 Vicryl suture in an interrupted subcutaneous fascial stitch.  All skin incisions were closed using 3-0 Monocryl suture in interrupted stitch and Dermabond skin adhesive was subsequently applied.  The patient received intraoperative Toradol.  The sponge, needle and instrument count were verified correct.  The patient was wanded per protocol.  The procedure performed and cell washing identification for cytology was verified with nursing personnel.  The patient was awakened without difficulty and was taken recovery room in good condition having  tolerated procedure well.   Anastasio Auerbach MD, 10:53 AM 05/20/2019

## 2019-05-20 NOTE — Transfer of Care (Signed)
Immediate Anesthesia Transfer of Care Note  Patient: Sherry Marquez  Procedure(s) Performed: LAPAROSCOPY DIAGNOSTIC (N/A Abdomen)  Patient Location: PACU  Anesthesia Type:General  Level of Consciousness: awake, alert  and oriented  Airway & Oxygen Therapy: Patient Spontanous Breathing and Patient connected to nasal cannula oxygen  Post-op Assessment: Report given to RN  Post vital signs: Reviewed and stable  Last Vitals:  Vitals Value Taken Time  BP    Temp    Pulse 79 05/20/19 1053  Resp 14 05/20/19 1053  SpO2 97 % 05/20/19 1053  Vitals shown include unvalidated device data.  Last Pain:  Vitals:   05/20/19 0855  TempSrc: Oral  PainSc: 0-No pain      Patients Stated Pain Goal: 6 (53/79/43 2761)  Complications: soft tissue trauma, bit the tip of her tongue while waking up

## 2019-05-20 NOTE — Discharge Instructions (Signed)
DISCHARGE INSTRUCTIONS: Laparoscopy  The following instructions have been prepared to help you care for yourself upon your return home today.  Wound care:  Do not get the incision wet for the first 24 hours. The incision should be kept clean and dry.  The Band-Aids or dressings may be removed the day after surgery.  Should the incision become sore, red, and swollen after the first week, check with your doctor.  Personal hygiene:  Shower the day after your procedure.  Activity and limitations:  Do NOT drive or operate any equipment today.  Do NOT lift anything more than 15 pounds for 2-3 weeks after surgery.  Do NOT rest in bed all day.  Walking is encouraged. Walk each day, starting slowly with 5-minute walks 3 or 4 times a day. Slowly increase the length of your walks.  Walk up and down stairs slowly.  Do NOT do strenuous activities, such as golfing, playing tennis, bowling, running, biking, weight lifting, gardening, mowing, or vacuuming for 2-4 weeks. Ask your doctor when it is okay to start.  Diet: Eat a light meal as desired this evening. You may resume your usual diet tomorrow.  Return to work: This is dependent on the type of work you do. For the most part you can return to a desk job within a week of surgery. If you are more active at work, please discuss this with your doctor.  What to expect after your surgery: You may have a slight burning sensation when you urinate on the first day. You may have a very small amount of blood in the urine. Expect to have a small amount of vaginal discharge/light bleeding for 1-2 weeks. It is not unusual to have abdominal soreness and bruising for up to 2 weeks. You may be tired and need more rest for about 1 week. You may experience shoulder pain for 24-72 hours. Lying flat in bed may relieve it.  Call your doctor for any of the following:  Develop a fever of 100.4 or greater  Inability to urinate 6 hours after discharge from  hospital  Severe pain not relieved by pain medications  Persistent of heavy bleeding at incision site  Redness or swelling around incision site after a week  Increasing nausea or vomiting  Patient Signature________________________________________ Nurse Signature_________________________________________ Post Anesthesia Home Care Instructions  Activity: Get plenty of rest for the remainder of the day. A responsible adult should stay with you for 24 hours following the procedure.  For the next 24 hours, DO NOT: -Drive a car -Paediatric nurse -Drink alcoholic beverages -Take any medication unless instructed by your physician -Make any legal decisions or sign important papers.  Meals: Start with liquid foods such as gelatin or soup. Progress to regular foods as tolerated. Avoid greasy, spicy, heavy foods. If nausea and/or vomiting occur, drink only clear liquids until the nausea and/or vomiting subsides. Call your physician if vomiting continues.  Special Instructions/Symptoms: Your throat may feel dry or sore from the anesthesia or the breathing tube placed in your throat during surgery. If this causes discomfort, gargle with warm salt water. The discomfort should disappear within 24 hours.  If you had a scopolamine patch placed behind your ear for the management of post- operative nausea and/or vomiting:  1. The medication in the patch is effective for 72 hours, after which it should be removed.  Wrap patch in a tissue and discard in the trash. Wash hands thoroughly with soap and water. 2. You may remove the patch  earlier than 72 hours if you experience unpleasant side effects which may include dry mouth, dizziness or visual disturbances. 3. Avoid touching the patch. Wash your hands with soap and water after contact with the patch.   Postoperative Instructions Laparoscopy  Dr. Phineas Real and the nursing staff have discussed postoperative instructions with you.  If you have any  questions please ask them before you leave the hospital, or call Dr Elisabeth Most office at [phone].    We would like to emphasize the following instructions:   ? Call the office to make your follow-up appointment as recommended by Dr Phineas Real (usually 1-2 weeks).  ? You were given a prescription, or one was ordered for you at the pharmacy you designated.  Get that prescription filled and take the medication according to instructions.  ? You may eat a regular diet, but slowly until you start having bowel movements.  ? Drink plenty of water daily.  ? Nothing in the vagina (intercourse, douching, objects of any kind) for 2 weeks.  When reinitiating intercourse, if it is uncomfortable, stop and make an appointment with Dr Phineas Real to be evaluated.  ? No driving for several days until the anesthesia has worn off and you are not having significant pain.  Car rides (short) are ok, as long as you are not having significant pain, but no traveling out of town until your postoperative appointment.  ? You may shower, but no baths for two weeks.  Walking up and down stairs is ok.  No heavy lifting, prolonged standing, repeated bending or any working out until your first  postoperative appointment.  ? Rest frequently, listen to your body and do not push yourself and overdo it.  ? Call if:  o Your pain medication does not seem strong enough. o Worsening pain or abdominal bloating o Persistent nausea or vomiting o Difficulty with urination or bowel movements. o Temperature of 101 degrees or higher. o Heavy vaginal bleeding.  If your period is due, you may use tampons.   o Incisions become red, tender or begin to drain. o You have any questions or concerns

## 2019-05-20 NOTE — H&P (Signed)
The patient had questions about the operative consent form.  Apparently there is a question as to whether her insurance would pay if she had her fallopian tubes removed and she does not want them removed prophylactically.  She understands that this is a risk reductive surgery for fallopian tube cancer in the future and accepts these risks and does not want the fallopian tubes removed.  She does agree to have the right ovary removed if it is my intraoperative decision that it is necessary and she also accepts the removal of one or both fallopian tubes if it is considered medically necessary.  The patient was examined, the right side marked and her questions were answered to her satisfaction.  I discussed again the expected postoperative course and what to expect and ASAP call precautions if she has any questions.   Anastasio Auerbach MD, 9:13 AM 05/20/2019

## 2019-05-20 NOTE — Anesthesia Procedure Notes (Signed)
Procedure Name: Intubation Date/Time: 05/20/2019 9:55 AM Performed by: Bonney Aid, CRNA Pre-anesthesia Checklist: Patient identified, Emergency Drugs available, Suction available and Patient being monitored Patient Re-evaluated:Patient Re-evaluated prior to induction Oxygen Delivery Method: Circle system utilized Preoxygenation: Pre-oxygenation with 100% oxygen Induction Type: IV induction Ventilation: Mask ventilation without difficulty Laryngoscope Size: Mac and 3 Grade View: Grade I Tube type: Oral Tube size: 7.0 mm Number of attempts: 1 Airway Equipment and Method: Stylet and Oral airway Placement Confirmation: ETT inserted through vocal cords under direct vision,  positive ETCO2 and breath sounds checked- equal and bilateral Secured at: 21 cm Tube secured with: Tape Dental Injury: Teeth and Oropharynx as per pre-operative assessment

## 2019-05-20 NOTE — Anesthesia Postprocedure Evaluation (Signed)
Anesthesia Post Note  Patient: Sherry Marquez  Procedure(s) Performed: LAPAROSCOPY DIAGNOSTIC (N/A Abdomen)     Patient location during evaluation: PACU Anesthesia Type: General Level of consciousness: awake and alert Pain management: pain level controlled Vital Signs Assessment: post-procedure vital signs reviewed and stable Respiratory status: spontaneous breathing, nonlabored ventilation and respiratory function stable Cardiovascular status: blood pressure returned to baseline and stable Postop Assessment: no apparent nausea or vomiting Anesthetic complications: no    Last Vitals:  Vitals:   05/20/19 1137 05/20/19 1140  BP:    Pulse: 73 71  Resp: (!) 24 10  Temp: 36.9 C   SpO2: 98% 99%    Last Pain:  Vitals:   05/20/19 1210  TempSrc:   PainSc: 2                  [name]

## 2019-05-20 NOTE — Anesthesia Preprocedure Evaluation (Addendum)
Anesthesia Evaluation  Patient identified by MRN, date of birth, ID band Patient awake    Reviewed: Allergy & Precautions, NPO status , Patient's Chart, lab work & pertinent test results  History of Anesthesia Complications Negative for: history of anesthetic complications  Airway Mallampati: I  TM Distance: >3 FB Neck ROM: Full    Dental  (+) Dental Advisory Given, Teeth Intact   Pulmonary neg pulmonary ROS,    Pulmonary exam normal        Cardiovascular negative cardio ROS Normal cardiovascular exam     Neuro/Psych negative neurological ROS  negative psych ROS   GI/Hepatic negative GI ROS, Neg liver ROS,   Endo/Other  diabetes, Gestational Obesity   Renal/GU negative Renal ROS     Musculoskeletal negative musculoskeletal ROS (+)   Abdominal   Peds  Hematology negative hematology ROS (+)   Anesthesia Other Findings   Reproductive/Obstetrics                           Anesthesia Physical Anesthesia Plan  ASA: II  Anesthesia Plan: General   Post-op Pain Management:    Induction: Intravenous  PONV Risk Score and Plan: 4 or greater and Treatment may vary due to age or medical condition, Ondansetron, Scopolamine patch - Pre-op, Midazolam and Dexamethasone  Airway Management Planned: Oral ETT  Additional Equipment: None  Intra-op Plan:   Post-operative Plan: Extubation in OR  Informed Consent: I have reviewed the patients History and Physical, chart, labs and discussed the procedure including the risks, benefits and alternatives for the proposed anesthesia with the patient or authorized representative who has indicated his/her understanding and acceptance.     Dental advisory given  Plan Discussed with: CRNA and Anesthesiologist  Anesthesia Plan Comments:        Anesthesia Quick Evaluation

## 2019-05-21 ENCOUNTER — Telehealth: Payer: Self-pay | Admitting: Gynecology

## 2019-05-21 ENCOUNTER — Telehealth: Payer: Self-pay

## 2019-05-21 ENCOUNTER — Encounter (HOSPITAL_BASED_OUTPATIENT_CLINIC_OR_DEPARTMENT_OTHER): Payer: Self-pay | Admitting: Gynecology

## 2019-05-21 LAB — CYTOLOGY - NON PAP

## 2019-05-21 NOTE — Telephone Encounter (Signed)
She said you were going to call her today to check on her. She said she is going quite well but is looking forward to talking to you and hearing about the surgery.

## 2019-05-21 NOTE — Telephone Encounter (Signed)
I called the patient in follow-up of her laparoscopy from yesterday.  She is doing well with minimal discomfort.  We reviewed findings at the time of surgery to include the inflammatory mass in the right adnexa.  We discussed possible long-term plans to include observation with follow-up sonography to follow this area assuming that she would have minimal pain.  Alternatives to include surgery possible robotic with realistic understanding of possible opening versus open surgery directly with definitive surgery such as hysterectomy with RSO assuming she still wants to maintain her left ovary.  We discussed the adherent nature of her sigmoid along the posterior aspect of the uterus and proximity of this inflammatory mass involving her sidewall raising the possibility of injury to intestine, ureter and vessels.  We also discussed preoperatively the right ovarian cyst suggested endometrioma but old TOA also a possibility although her left side looked perfectly normal.  She had no evidence of endometriosis grossly visualized on the peritoneal surfaces.  Patient will slowly resume normal activities and follow-up with me in 2 weeks at her already scheduled appointment.  We will further discuss options at that time.  Cytology from the opening cell washing was negative.

## 2019-05-21 NOTE — Telephone Encounter (Signed)
I already called her

## 2019-06-06 ENCOUNTER — Other Ambulatory Visit: Payer: Self-pay

## 2019-06-07 ENCOUNTER — Ambulatory Visit (INDEPENDENT_AMBULATORY_CARE_PROVIDER_SITE_OTHER): Payer: 59 | Admitting: Gynecology

## 2019-06-07 ENCOUNTER — Encounter: Payer: Self-pay | Admitting: Gynecology

## 2019-06-07 ENCOUNTER — Other Ambulatory Visit: Payer: Self-pay | Admitting: *Deleted

## 2019-06-07 VITALS — BP 144/84

## 2019-06-07 DIAGNOSIS — Z9889 Other specified postprocedural states: Secondary | ICD-10-CM

## 2019-06-07 DIAGNOSIS — N83201 Unspecified ovarian cyst, right side: Secondary | ICD-10-CM

## 2019-06-07 NOTE — Patient Instructions (Signed)
Follow-up for ultrasound and office visit in January with Dr Dellis Filbert.

## 2019-06-07 NOTE — Progress Notes (Signed)
    Sherry Marquez 04/19/1971 [id number]        48 y.o.  [id number] presents with her husband for postop visit status post diagnostic laparoscopy.  She has a history of a right ovarian cyst suspicious for endometrioma.  She was having pain and underwent laparoscopy.  At the time of surgery the right adnexa was encased in thick adhesions such that ovary/fallopian tube could not be identified.  It was adherent from pelvic sidewall to mid posterior uterine surface including the sigmoid adherent to this area.  Left adnexa was clear noting normal fallopian tube and ovary.  No active endometriosis was visualized throughout the pelvis.  Past medical history,surgical history, problem list, medications, allergies, family history and social history were all reviewed and documented in the EPIC chart.  Directed ROS with pertinent positives and negatives documented in the history of present illness/assessment and plan.  Exam: Vitals:   06/07/19 0817  BP: (!) 144/84   General appearance:  Normal Abdomen soft nontender without masses guarding rebound.  Incisions all healing nicely.  Assessment/Plan:  48 y.o. [id number] with inflammatory mass right adnexa suspicious for endometrioma.  Reviewed options with the patient and her husband to include expectant management noting that her [facility] is elevated and it appears that she is going through menopause.  Hopefully with following estrogen levels endometriosis related symptoms will resolve.  She does note that her pain overall from earlier this year is better but still having right-sided discomfort particularly at the end of the day that she takes Advil for.  I do not endometriosis medication treatment indicated at this point given that she is getting into menopause and her pain is improving.  Ultimately we discussed surgical options either to proceed now or wait and see if her pain persists or if this area enlarges.  Possible trial at robotic versus open.  Possible referral to  a [facility].  We also discussed that we are thinking that this is an endometrioma but do not have pathology documentation in no guarantees at this point as far as pathology.  We all agree on repeating the ultrasound in 2 months to relook at this area and see how her pain is doing and then make a decision at that point as far as continuing expectant management versus surgery.  They are comfortable with this approach and will call sooner if any issues.    Anastasio Auerbach MD, 8:55 AM 06/07/2019

## 2019-06-07 NOTE — Progress Notes (Signed)
No note needed.  Locating surgery images. KW CMA

## 2019-06-11 ENCOUNTER — Encounter: Payer: Self-pay | Admitting: Family Medicine

## 2019-06-25 ENCOUNTER — Encounter: Payer: Self-pay | Admitting: Women's Health

## 2019-08-22 ENCOUNTER — Ambulatory Visit: Payer: 59 | Admitting: Obstetrics & Gynecology

## 2019-08-22 ENCOUNTER — Other Ambulatory Visit: Payer: 59

## 2019-09-02 ENCOUNTER — Other Ambulatory Visit: Payer: Self-pay

## 2019-09-02 ENCOUNTER — Ambulatory Visit: Payer: 59 | Admitting: Family Medicine

## 2019-09-02 ENCOUNTER — Encounter: Payer: Self-pay | Admitting: Family Medicine

## 2019-09-02 VITALS — BP 142/88 | HR 74 | Temp 97.5°F | Ht 65.5 in | Wt 212.4 lb

## 2019-09-02 DIAGNOSIS — E669 Obesity, unspecified: Secondary | ICD-10-CM

## 2019-09-02 DIAGNOSIS — R030 Elevated blood-pressure reading, without diagnosis of hypertension: Secondary | ICD-10-CM | POA: Diagnosis not present

## 2019-09-02 DIAGNOSIS — N83201 Unspecified ovarian cyst, right side: Secondary | ICD-10-CM | POA: Insufficient documentation

## 2019-09-02 DIAGNOSIS — L719 Rosacea, unspecified: Secondary | ICD-10-CM | POA: Diagnosis not present

## 2019-09-02 NOTE — Patient Instructions (Signed)
Please check blood pressures once or twice daily for a week; let me know if elevated over 135/85 on more than one occasion.

## 2019-09-02 NOTE — Progress Notes (Signed)
Sherry Marquez DOB: 09-Oct-1970 Encounter date: 09/02/2019  This is a 49 y.o. female who presents to establish care. Chief Complaint  Patient presents with  . Establish Care   Last lipid/glucose done 10/2018  History of present illness:  Eczema:just a few years couple bouts but not issue regularly. Just moisturizes well in winter.   Allergies: allergic to cats. But otherwise no seasonal/environmental allergies.   Gestational dm was first pregnancy only.   Follows with Dr. Phineas Real for gyn needs but will be following with Dr. Dellis Filbert. Has annual set up with gyn in March. Has been following with derm for rosacea. [facility]   Past Medical History:  Diagnosis Date  . Allergy   . Eczema   . Environmental and seasonal allergies   . History of gestational diabetes   . LUMBAR SPRAIN AND STRAIN 12/05/2009   Qualifier: Diagnosis of  By: Sarajane Jews MD, Ishmael Holter   . Right ovarian cyst   . Rosacea    Past Surgical History:  Procedure Laterality Date  . CESAREAN SECTION  03-15-2005   dr Phineas Real  @[facility]   . LAPAROSCOPY N/A 05/20/2019   Procedure: LAPAROSCOPY DIAGNOSTIC;  Surgeon: Anastasio Auerbach, MD;  Location: [place];  Service: Gynecology;  Laterality: N/A;  . WISDOM TOOTH EXTRACTION  2001   Allergies  Allergen Reactions  . Cefotan [Cefotetan] Rash    Developed rash during IV infusion- dose completed -benadryl given, no other issues occured   Current Meds  Medication Sig  . cetirizine (ZYRTEC) 10 MG tablet Take 10 mg by mouth daily.  . Cholecalciferol (VITAMIN D3) 50 MCG (2000 UT) TABS Take by mouth daily.  Marland Kitchen DOXYCYCLINE HYCLATE PO Take by mouth daily.  . Ibuprofen (ADVIL) 200 MG CAPS Take by mouth as needed.  . metroNIDAZOLE (METROCREAM) 0.75 % cream Apply topically daily.  . Multiple Vitamins-Minerals (MULTIVITAMIN ADULT PO) Take by mouth daily.  . [DISCONTINUED] Multiple Vitamins-Minerals (ZINC PO) Take 44 mg by mouth daily.  . [DISCONTINUED]  oxyCODONE-acetaminophen (PERCOCET) 5-325 MG tablet Take 1 tablet by mouth every 4 (four) hours as needed for moderate pain.   Social History   Tobacco Use  . Smoking status: Never Smoker  . Smokeless tobacco: Never Used  Substance Use Topics  . Alcohol use: Yes    Comment: rare   Family History  Problem Relation Age of Onset  . Heart disease Father        defibrillator  . Hypertension Father   . Heart attack Father 21  . CAD Father        4-way bypass  . Hypertension Brother   . Atrial fibrillation Mother   . Hypothyroidism Mother   . Lymphoma Maternal Grandmother 80  . Congestive Heart Failure Maternal Grandmother   . Stroke Maternal Grandfather 80  . Cancer Paternal Grandmother        mets to lung  . Heart attack Paternal Grandfather        early death     Review of Systems  Constitutional: Negative for chills, fatigue and fever.  Respiratory: Negative for cough, chest tightness, shortness of breath and wheezing.   Cardiovascular: Negative for chest pain, palpitations and leg swelling.    Objective:  BP (!) 142/88   Pulse 74   Temp (!) 97.5 F (36.4 C) (Temporal)   Ht 5' 5.5" (1.664 m)   Wt 212 lb 6.4 oz (96.3 kg)   SpO2 100%   BMI 34.81 kg/m   Weight:  212 lb 6.4 oz (96.3 kg)   BP Readings from Last 3 Encounters:  09/02/19 (!) 142/88  06/07/19 (!) 144/84  05/20/19 (!) 150/100   Wt Readings from Last 3 Encounters:  09/02/19 212 lb 6.4 oz (96.3 kg)  05/20/19 203 lb 7 oz (92.3 kg)  10/15/18 209 lb (94.8 kg)    Physical Exam Constitutional:      General: She is not in acute distress.    Appearance: She is well-developed.  Cardiovascular:     Rate and Rhythm: Normal rate and regular rhythm.     Heart sounds: Normal heart sounds. No murmur. No friction rub.  Pulmonary:     Effort: Pulmonary effort is normal. No respiratory distress.     Breath sounds: Normal breath sounds. No wheezing or rales.  Musculoskeletal:     Right lower leg: No edema.      Left lower leg: No edema.  Neurological:     Mental Status: She is alert and oriented to person, place, and time.  Psychiatric:        Behavior: Behavior normal.     Assessment/Plan:  1. Rosacea Following with derm; just starting with topical treatment after completing abx.   2. Elevated blood pressure reading She will check at home and report back if elevated. Our cuff today is not fitting well and longer cuff is too thick for her arm. Suggested wrist cuff as outpatient.   3. Obesity (BMI 30.0-34.9) We discussed returning to diet and exercise which she was previously enjoying. Discussed contacting [facility]; or could consider [facility] weight [facility] or follow up with me for further discussion of options if desired.  She will get bloodwork through gyn; we discussed consideration for lower threshold for statin given family heart history.  Return in about 6 months (around 03/01/2020) for physical exam.  Micheline Rough, MD

## 2019-09-09 ENCOUNTER — Other Ambulatory Visit: Payer: Self-pay

## 2019-09-10 ENCOUNTER — Encounter: Payer: Self-pay | Admitting: Obstetrics & Gynecology

## 2019-09-10 ENCOUNTER — Ambulatory Visit (INDEPENDENT_AMBULATORY_CARE_PROVIDER_SITE_OTHER): Payer: 59

## 2019-09-10 ENCOUNTER — Ambulatory Visit: Payer: 59 | Admitting: Obstetrics & Gynecology

## 2019-09-10 VITALS — BP 122/80

## 2019-09-10 DIAGNOSIS — N83201 Unspecified ovarian cyst, right side: Secondary | ICD-10-CM

## 2019-09-10 DIAGNOSIS — N854 Malposition of uterus: Secondary | ICD-10-CM | POA: Diagnosis not present

## 2019-09-10 NOTE — Patient Instructions (Signed)
1. Right ovarian cyst Pelvic ultrasound findings thoroughly reviewed with patient.  Her uterus is completely normal and the endometrial lining is thin and normal.  The left ovary is normal.  The right ovary is also normal with a much smaller cyst compatible with a functional cyst measured at 2.1 cm.  Rt Ovarian cyst was drained during LPS 05/20/2019.  Cytology benign.  Patient reassured.  Sherry Marquez, it was a pleasure seeing you today!

## 2019-09-10 NOTE — Progress Notes (Signed)
    ELLISON LEISURE 1971-05-25 [id number]        49 y.o.  [id number] Married.  Vasectomy.  RP: Rt Ovarian Cyst for Pelvic US  HPI: Had Dx LPS 05/20/2019 but Rt Ovarian Cyst drained, clear fluid.  Cytology Benign.  Since then, Rt Pelvic pain resolved.   OB History  Gravida Para Term Preterm AB Living  3 2     1 2   SAB TAB Ectopic Multiple Live Births               # Outcome Date GA Lbr Len/2nd Weight Sex Delivery Anes PTL Lv  3 AB           2 Para           1 Para             Past medical history,surgical history, problem list, medications, allergies, family history and social history were all reviewed and documented in the EPIC chart.   Directed ROS with pertinent positives and negatives documented in the history of present illness/assessment and plan.  Exam:  Vitals:   09/10/19 1016  BP: 122/80   General appearance:  Normal  Pelvic US today: T/V images. Anteverted uterus normal in size and shape with no myometrial masses. The uterus is measured at 8.92 x 5.48 x 4.19 cm. Symmetrical Secretary endometrial line with no mass or thickening seen. Left ovary mobile with 2 simple follicles measured at 1.4 and 1.5 cm. Right ovary adherent to the right uterine sidewall with a cyst with debris much smaller today measured at 2.1 x 1.5 cm. Nontender to palpation. No free fluid in the posterior cul-de-sac.   Assessment/Plan:  49 y.o. [id number]   1. Right ovarian cyst Pelvic ultrasound findings thoroughly reviewed with patient.  Her uterus is completely normal and the endometrial lining is thin and normal.  The left ovary is normal.  The right ovary is also normal with a much smaller cyst compatible with a functional cyst measured at 2.1 cm.  Rt Ovarian cyst was drained during LPS 05/20/2019.  Cytology benign.  Patient reassured.  Princess Bruins MD, 10:33 AM 09/10/2019

## 2019-10-16 ENCOUNTER — Encounter: Payer: 59 | Admitting: Women's Health

## 2019-10-16 NOTE — Telephone Encounter (Signed)
Note signed

## 2019-10-22 ENCOUNTER — Encounter: Payer: Self-pay | Admitting: Women's Health

## 2019-10-22 ENCOUNTER — Ambulatory Visit (INDEPENDENT_AMBULATORY_CARE_PROVIDER_SITE_OTHER): Payer: 59 | Admitting: Women's Health

## 2019-10-22 ENCOUNTER — Other Ambulatory Visit: Payer: Self-pay

## 2019-10-22 VITALS — BP 132/80 | Ht 65.0 in | Wt 218.0 lb

## 2019-10-22 DIAGNOSIS — Z01419 Encounter for gynecological examination (general) (routine) without abnormal findings: Secondary | ICD-10-CM

## 2019-10-22 DIAGNOSIS — Z13220 Encounter for screening for lipoid disorders: Secondary | ICD-10-CM

## 2019-10-22 NOTE — Progress Notes (Signed)
Sherry Marquez 06-28-1971 [id number]    History:    Presents for annual exam.  Cycles every 1 to 2 months vasectomy.  History of anxiety and depression stable.  Was having pelvic pain and was noted to have a right hemorrhagic cyst 7 cm removed 05/2020.  Follow-up ultrasound shows a 2 cm simple cyst on right ovary.  Normal Pap and mammogram history.    Past medical history, past surgical history, family history and social history were all reviewed and documented in the EPIC chart.  Homemaker.  Sons ages 18 and 21 both doing well..  ROS:  A ROS was performed and pertinent positives and negatives are included.  Exam:  Vitals:   10/22/19 1013  BP: 132/80  Weight: 218 lb (98.9 kg)  Height: 5' 5"  (1.651 m)   Body mass index is 36.28 kg/m.   General appearance:  Normal Thyroid:  Symmetrical, normal in size, without palpable masses or nodularity. Respiratory  Auscultation:  Clear without wheezing or rhonchi Cardiovascular  Auscultation:  Regular rate, without rubs, murmurs or gallops  Edema/varicosities:  Not grossly evident Abdominal  Soft,nontender, without masses, guarding or rebound.  Liver/spleen:  No organomegaly noted  Hernia:  None appreciated  Skin  Inspection:  Grossly normal   Breasts: Examined lying and sitting.     Right: Without masses, retractions, discharge or axillary adenopathy.     Left: Without masses, retractions, discharge or axillary adenopathy. Gentitourinary   Inguinal/mons:  Normal without inguinal adenopathy  External genitalia:  Normal  BUS/Urethra/Skene's glands:  Normal  Vagina:  Normal  Cervix:  Normal  Uterus:  normal in size, shape and contour.  Midline and mobile  Adnexa/parametria:     Rt: Without masses or tenderness.   Lt: Without masses or tenderness.  Anus and perineum: Normal  Digital rectal exam: Normal sphincter tone without palpated masses or tenderness  Assessment/Plan:  49 y.o. MWF G3 P2 for annual exam with no GYN  complaints.  Cycles every 1 to 2 months status post vasectomy 05/2020 right cystectomy benign Obesity  Plan: Reports struggling with weight loss, encouraged to follow-up at [facility] management department.  Weight watchers encouraged.  SBEs, continue annual screening mammogram, calcium rich foods, is, vitamin D 1000 daily encouraged.  Aware of importance of regular weightbearing exercise, encouraged yoga, self-care and leisure activities.  CBC, CMP, lipid panel, Pap normal 2018 with negative HR HPV typing, new screening guidelines reviewed.    Huel Cote [facility], 10:24 AM 10/22/2019

## 2019-10-22 NOTE — Patient Instructions (Signed)
Good to see you today! Vit D 2000 iu daily Health Maintenance, Female Adopting a healthy lifestyle and getting preventive care are important in promoting health and wellness. Ask your health care provider about:  The right schedule for you to have regular tests and exams.  Things you can do on your own to prevent diseases and keep yourself healthy. What should I know about diet, weight, and exercise? Eat a healthy diet   Eat a diet that includes plenty of vegetables, fruits, low-fat dairy products, and lean protein.  Do not eat a lot of foods that are high in solid fats, added sugars, or sodium. Maintain a healthy weight Body mass index (BMI) is used to identify weight problems. It estimates body fat based on height and weight. Your health care provider can help determine your BMI and help you achieve or maintain a healthy weight. Get regular exercise Get regular exercise. This is one of the most important things you can do for your health. Most adults should:  Exercise for at least 150 minutes each week. The exercise should increase your heart rate and make you sweat (moderate-intensity exercise).  Do strengthening exercises at least twice a week. This is in addition to the moderate-intensity exercise.  Spend less time sitting. Even light physical activity can be beneficial. Watch cholesterol and blood lipids Have your blood tested for lipids and cholesterol at 48 years of age, then have this test every 5 years. Have your cholesterol levels checked more often if:  Your lipid or cholesterol levels are high.  You are older than 48 years of age.  You are at high risk for heart disease. What should I know about cancer screening? Depending on your health history and family history, you may need to have cancer screening at various ages. This may include screening for:  Breast cancer.  Cervical cancer.  Colorectal cancer.  Skin cancer.  Lung cancer. What should I know about  heart disease, diabetes, and high blood pressure? Blood pressure and heart disease  High blood pressure causes heart disease and increases the risk of stroke. This is more likely to develop in people who have high blood pressure readings, are of African descent, or are overweight.  Have your blood pressure checked: ? Every 3-5 years if you are 70-2 years of age. ? Every year if you are 42 years old or older. Diabetes Have regular diabetes screenings. This checks your fasting blood sugar level. Have the screening done:  Once every three years after age 46 if you are at a normal weight and have a low risk for diabetes.  More often and at a younger age if you are overweight or have a high risk for diabetes. What should I know about preventing infection? Hepatitis B If you have a higher risk for hepatitis B, you should be screened for this virus. Talk with your health care provider to find out if you are at risk for hepatitis B infection. Hepatitis C Testing is recommended for:  Everyone born from [birth year] through 1964.  Anyone with known risk factors for hepatitis C. Sexually transmitted infections (STIs)  Get screened for STIs, including gonorrhea and chlamydia, if: ? You are sexually active and are younger than 48 years of age. ? You are older than 48 years of age and your health care provider tells you that you are at risk for this type of infection. ? Your sexual activity has changed since you were last screened, and you are at increased risk  for chlamydia or gonorrhea. Ask your health care provider if you are at risk.  Ask your health care provider about whether you are at high risk for HIV. Your health care provider may recommend a prescription medicine to help prevent HIV infection. If you choose to take medicine to prevent HIV, you should first get tested for HIV. You should then be tested every 3 months for as long as you are taking the medicine. Pregnancy  If you are about to  stop having your period (premenopausal) and you may become pregnant, seek counseling before you get pregnant.  Take 400 to 800 micrograms (mcg) of folic acid every day if you become pregnant.  Ask for birth control (contraception) if you want to prevent pregnancy. Osteoporosis and menopause Osteoporosis is a disease in which the bones lose minerals and strength with aging. This can result in bone fractures. If you are 21 years old or older, or if you are at risk for osteoporosis and fractures, ask your health care provider if you should:  Be screened for bone loss.  Take a calcium or vitamin D supplement to lower your risk of fractures.  Be given hormone replacement therapy (HRT) to treat symptoms of menopause. Follow these instructions at home: Lifestyle  Do not use any products that contain nicotine or tobacco, such as cigarettes, e-cigarettes, and chewing tobacco. If you need help quitting, ask your health care provider.  Do not use street drugs.  Do not share needles.  Ask your health care provider for help if you need support or information about quitting drugs. Alcohol use  Do not drink alcohol if: ? Your health care provider tells you not to drink. ? You are pregnant, may be pregnant, or are planning to become pregnant.  If you drink alcohol: ? Limit how much you use to 0-1 drink a day. ? Limit intake if you are breastfeeding.  Be aware of how much alcohol is in your drink. In the [place] one drink equals one 12 oz bottle of beer (355 mL), one 5 oz glass of wine (148 mL), or one 1 oz glass of hard liquor (44 mL). General instructions  Schedule regular health, dental, and eye exams.  Stay current with your vaccines.  Tell your health care provider if: ? You often feel depressed. ? You have ever been abused or do not feel safe at home. Summary  Adopting a healthy lifestyle and getting preventive care are important in promoting health and wellness.  Follow your  health care provider's instructions about healthy diet, exercising, and getting tested or screened for diseases.  Follow your health care provider's instructions on monitoring your cholesterol and blood pressure. This information is not intended to replace advice given to you by your health care provider. Make sure you discuss any questions you have with your health care provider. Document Revised: 07/18/2018 Document Reviewed: 07/18/2018 Elsevier Patient Education  2020 [employer].

## 2019-12-05 ENCOUNTER — Other Ambulatory Visit: Payer: Self-pay

## 2019-12-06 ENCOUNTER — Other Ambulatory Visit: Payer: 59

## 2019-12-07 LAB — CBC WITH DIFFERENTIAL/PLATELET
Absolute Monocytes: 523 cells/uL (ref 200–950)
Basophils Absolute: 69 cells/uL (ref 0–200)
Basophils Relative: 1.1 %
Eosinophils Absolute: 139 cells/uL (ref 15–500)
Eosinophils Relative: 2.2 %
HCT: 46.5 % — ABNORMAL HIGH (ref 35.0–45.0)
Hemoglobin: 15.3 g/dL (ref 11.7–15.5)
Lymphs Abs: 1877 cells/uL (ref 850–3900)
MCH: 30.2 pg (ref 27.0–33.0)
MCHC: 32.9 g/dL (ref 32.0–36.0)
MCV: 91.9 fL (ref 80.0–100.0)
MPV: 10.5 fL (ref 7.5–12.5)
Monocytes Relative: 8.3 %
Neutro Abs: 3692 cells/uL (ref 1500–7800)
Neutrophils Relative %: 58.6 %
Platelets: 297 10*3/uL (ref 140–400)
RBC: 5.06 10*6/uL (ref 3.80–5.10)
RDW: 12.6 % (ref 11.0–15.0)
Total Lymphocyte: 29.8 %
WBC: 6.3 10*3/uL (ref 3.8–10.8)

## 2019-12-07 LAB — LIPID PANEL
Cholesterol: 229 mg/dL — ABNORMAL HIGH (ref <200)
HDL: 59 mg/dL (ref >=50)
LDL Cholesterol (Calc): 152 mg/dL (calc) — ABNORMAL HIGH
Non-HDL Cholesterol (Calc): 170 mg/dL (calc) — ABNORMAL HIGH (ref <130)
Total CHOL/HDL Ratio: 3.9 (calc) (ref <5.0)
Triglycerides: 79 mg/dL (ref <150)

## 2019-12-07 LAB — COMPREHENSIVE METABOLIC PANEL
AG Ratio: 1.8 (calc) (ref 1.0–2.5)
ALT: 22 U/L (ref 6–29)
AST: 21 U/L (ref 10–35)
Albumin: 4.5 g/dL (ref 3.6–5.1)
Alkaline phosphatase (APISO): 73 U/L (ref 31–125)
BUN: 17 mg/dL (ref 7–25)
CO2: 24 mmol/L (ref 20–32)
Calcium: 9.8 mg/dL (ref 8.6–10.2)
Chloride: 105 mmol/L (ref 98–110)
Creat: 1.03 mg/dL (ref 0.50–1.10)
Globulin: 2.5 g/dL (calc) (ref 1.9–3.7)
Glucose, Bld: 118 mg/dL — ABNORMAL HIGH (ref 65–99)
Potassium: 4.8 mmol/L (ref 3.5–5.3)
Sodium: 139 mmol/L (ref 135–146)
Total Bilirubin: 0.4 mg/dL (ref 0.2–1.2)
Total Protein: 7 g/dL (ref 6.1–8.1)

## 2020-02-27 ENCOUNTER — Encounter: Payer: 59 | Admitting: Family Medicine

## 2020-03-02 ENCOUNTER — Encounter: Payer: 59 | Admitting: Family Medicine

## 2020-03-03 ENCOUNTER — Encounter: Payer: Self-pay | Admitting: Nurse Practitioner

## 2020-03-03 ENCOUNTER — Other Ambulatory Visit: Payer: Self-pay

## 2020-03-03 ENCOUNTER — Ambulatory Visit: Payer: 59 | Admitting: Nurse Practitioner

## 2020-03-03 VITALS — BP 136/84

## 2020-03-03 DIAGNOSIS — N951 Menopausal and female climacteric states: Secondary | ICD-10-CM

## 2020-03-03 DIAGNOSIS — N926 Irregular menstruation, unspecified: Secondary | ICD-10-CM | POA: Diagnosis not present

## 2020-03-03 MED ORDER — MEGESTROL ACETATE 40 MG PO TABS: 40.0000 mg | ORAL_TABLET | Freq: Two times a day (BID) | ORAL | 1 refills | Status: AC

## 2020-03-03 NOTE — Progress Notes (Signed)
   Acute Office Visit  Subjective:    Patient ID: Sherry Marquez, female    DOB: 07/10/1971, 49 y.o.   MRN: [id number]   HPI 49 y.o. presents today for irregular bleeding. This began a couple of years ago. LMP 02/11/2020 and has been having bleeding that ranges from heavy with clots to light since. Prior to this her last cycle was in March. She has minimal menopausal symptoms. Ovarian cyst removed 07/2019. Denies abdominal pain.    Review of Systems  Constitutional: Negative.   Gastrointestinal: Negative.   Genitourinary: Positive for vaginal bleeding.       Objective:    Physical Exam Constitutional:      Appearance: Normal appearance.  Abdominal:     Palpations: Abdomen is soft.     Tenderness: There is no abdominal tenderness.  Genitourinary:    General: Normal vulva.     Vagina: Bleeding present.     Cervix: Normal.     Uterus: Normal.      BP (!) 136/84   LMP 02/11/2020  Wt Readings from Last 3 Encounters:  10/22/19 218 lb (98.9 kg)  09/02/19 212 lb 6.4 oz (96.3 kg)  05/20/19 203 lb 7 oz (92.3 kg)        Assessment & Plan:   Problem List Items Addressed This Visit    None    Visit Diagnoses    Irregular bleeding    -  Primary   Relevant Medications   megestrol (MEGACE) 40 MG tablet   Perimenopause          Plan: Bleeding most likely related to perimenopause. We discussed treatment options to include Megace to stop bleeding temporarily and hormone therapy. She was on birth control for many years and does not want to restart. Megace 40 mg twice a day for 10 days with 1 refill. She will let me know if she continues to have periods of extended bleeding and we will discuss hormone therapy further. She is agreeable to plan.     Blaine, 12:30 PM 03/03/2020

## 2020-06-03 ENCOUNTER — Encounter: Payer: 59 | Admitting: Family Medicine

## 2020-06-30 ENCOUNTER — Encounter: Payer: Self-pay | Admitting: Nurse Practitioner

## 2020-09-02 ENCOUNTER — Emergency Department (HOSPITAL_COMMUNITY): Payer: 59

## 2020-09-02 ENCOUNTER — Ambulatory Visit (HOSPITAL_COMMUNITY)
Admission: EM | Admit: 2020-09-02 | Discharge: 2020-09-02 | Disposition: A | Discharge status: Home / Self Care | Payer: 59 | Source: Home / Self Care | Attending: Emergency Medicine | Admitting: Emergency Medicine

## 2020-09-02 ENCOUNTER — Encounter (HOSPITAL_COMMUNITY): Payer: Self-pay | Admitting: Emergency Medicine

## 2020-09-02 ENCOUNTER — Inpatient Hospital Stay (HOSPITAL_COMMUNITY)
Admission: EM | Admit: 2020-09-02 | Discharge: 2020-09-05 | DRG: 177 | Disposition: A | Discharge status: Home / Self Care | Payer: 59 | Source: Ambulatory Visit | Attending: Internal Medicine | Admitting: Internal Medicine

## 2020-09-02 ENCOUNTER — Ambulatory Visit (INDEPENDENT_AMBULATORY_CARE_PROVIDER_SITE_OTHER): Payer: 59

## 2020-09-02 ENCOUNTER — Other Ambulatory Visit: Payer: Self-pay

## 2020-09-02 ENCOUNTER — Encounter (HOSPITAL_COMMUNITY): Payer: Self-pay | Admitting: *Deleted

## 2020-09-02 DIAGNOSIS — I82442 Acute embolism and thrombosis of left tibial vein: Secondary | ICD-10-CM | POA: Diagnosis present

## 2020-09-02 DIAGNOSIS — J302 Other seasonal allergic rhinitis: Secondary | ICD-10-CM | POA: Diagnosis present

## 2020-09-02 DIAGNOSIS — Z6836 Body mass index (BMI) 36.0-36.9, adult: Secondary | ICD-10-CM | POA: Diagnosis not present

## 2020-09-02 DIAGNOSIS — E785 Hyperlipidemia, unspecified: Secondary | ICD-10-CM | POA: Diagnosis present

## 2020-09-02 DIAGNOSIS — L309 Dermatitis, unspecified: Secondary | ICD-10-CM | POA: Diagnosis present

## 2020-09-02 DIAGNOSIS — F32A Depression, unspecified: Secondary | ICD-10-CM | POA: Diagnosis present

## 2020-09-02 DIAGNOSIS — J9601 Acute respiratory failure with hypoxia: Secondary | ICD-10-CM | POA: Diagnosis not present

## 2020-09-02 DIAGNOSIS — Z881 Allergy status to other antibiotic agents status: Secondary | ICD-10-CM | POA: Diagnosis not present

## 2020-09-02 DIAGNOSIS — R0600 Dyspnea, unspecified: Secondary | ICD-10-CM

## 2020-09-02 DIAGNOSIS — Z20822 Contact with and (suspected) exposure to covid-19: Secondary | ICD-10-CM | POA: Diagnosis not present

## 2020-09-02 DIAGNOSIS — L719 Rosacea, unspecified: Secondary | ICD-10-CM | POA: Diagnosis present

## 2020-09-02 DIAGNOSIS — R4589 Other symptoms and signs involving emotional state: Secondary | ICD-10-CM

## 2020-09-02 DIAGNOSIS — Z823 Family history of stroke: Secondary | ICD-10-CM

## 2020-09-02 DIAGNOSIS — E669 Obesity, unspecified: Secondary | ICD-10-CM | POA: Diagnosis present

## 2020-09-02 DIAGNOSIS — Z8249 Family history of ischemic heart disease and other diseases of the circulatory system: Secondary | ICD-10-CM

## 2020-09-02 DIAGNOSIS — I2699 Other pulmonary embolism without acute cor pulmonale: Secondary | ICD-10-CM

## 2020-09-02 DIAGNOSIS — R0602 Shortness of breath: Secondary | ICD-10-CM

## 2020-09-02 DIAGNOSIS — U071 COVID-19: Secondary | ICD-10-CM | POA: Insufficient documentation

## 2020-09-02 DIAGNOSIS — J129 Viral pneumonia, unspecified: Secondary | ICD-10-CM

## 2020-09-02 DIAGNOSIS — F419 Anxiety disorder, unspecified: Secondary | ICD-10-CM | POA: Diagnosis present

## 2020-09-02 DIAGNOSIS — Z807 Family history of other malignant neoplasms of lymphoid, hematopoietic and related tissues: Secondary | ICD-10-CM | POA: Diagnosis not present

## 2020-09-02 DIAGNOSIS — J189 Pneumonia, unspecified organism: Secondary | ICD-10-CM

## 2020-09-02 DIAGNOSIS — J1282 Pneumonia due to coronavirus disease 2019: Secondary | ICD-10-CM | POA: Diagnosis present

## 2020-09-02 LAB — FIBRINOGEN: Fibrinogen: 659 mg/dL — ABNORMAL HIGH (ref 210–475)

## 2020-09-02 LAB — SARS CORONAVIRUS 2 (TAT 6-24 HRS): SARS Coronavirus 2: POSITIVE — AB

## 2020-09-02 LAB — CBC WITH DIFFERENTIAL/PLATELET
Abs Immature Granulocytes: 0.04 10*3/uL (ref 0.00–0.07)
Basophils Absolute: 0 10*3/uL (ref 0.0–0.1)
Basophils Relative: 0 %
Eosinophils Absolute: 0 10*3/uL (ref 0.0–0.5)
Eosinophils Relative: 0 %
HCT: 42.5 % (ref 36.0–46.0)
Hemoglobin: 13.9 g/dL (ref 12.0–15.0)
Immature Granulocytes: 0 %
Lymphocytes Relative: 10 %
Lymphs Abs: 0.9 10*3/uL (ref 0.7–4.0)
MCH: 30.3 pg (ref 26.0–34.0)
MCHC: 32.7 g/dL (ref 30.0–36.0)
MCV: 92.8 fL (ref 80.0–100.0)
Monocytes Absolute: 0.8 10*3/uL (ref 0.1–1.0)
Monocytes Relative: 9 %
Neutro Abs: 7.2 10*3/uL (ref 1.7–7.7)
Neutrophils Relative %: 81 %
Platelets: 620 10*3/uL — ABNORMAL HIGH (ref 150–400)
RBC: 4.58 MIL/uL (ref 3.87–5.11)
RDW: 14.3 % (ref 11.5–15.5)
WBC: 9 10*3/uL (ref 4.0–10.5)
nRBC: 0 % (ref 0.0–0.2)

## 2020-09-02 LAB — COMPREHENSIVE METABOLIC PANEL
ALT: 24 U/L (ref 0–44)
AST: 19 U/L (ref 15–41)
Albumin: 3.3 g/dL — ABNORMAL LOW (ref 3.5–5.0)
Alkaline Phosphatase: 66 U/L (ref 38–126)
Anion gap: 12 (ref 5–15)
BUN: 10 mg/dL (ref 6–20)
CO2: 23 mmol/L (ref 22–32)
Calcium: 8.9 mg/dL (ref 8.9–10.3)
Chloride: 101 mmol/L (ref 98–111)
Creatinine, Ser: 0.85 mg/dL (ref 0.44–1.00)
GFR, Estimated: >60 mL/min (ref >60)
Glucose, Bld: 132 mg/dL — ABNORMAL HIGH (ref 70–99)
Potassium: 4 mmol/L (ref 3.5–5.1)
Sodium: 136 mmol/L (ref 135–145)
Total Bilirubin: 0.4 mg/dL (ref 0.3–1.2)
Total Protein: 7.7 g/dL (ref 6.5–8.1)

## 2020-09-02 LAB — I-STAT BETA HCG BLOOD, ED (MC, WL, AP ONLY): I-stat hCG, quantitative: <5 m[IU]/mL (ref <5)

## 2020-09-02 LAB — PROCALCITONIN: Procalcitonin: <0.1 ng/mL

## 2020-09-02 LAB — FERRITIN: Ferritin: 65 ng/mL (ref 11–307)

## 2020-09-02 LAB — TRIGLYCERIDES: Triglycerides: 97 mg/dL (ref <150)

## 2020-09-02 LAB — D-DIMER, QUANTITATIVE: D-Dimer, Quant: 13.72 ug/mL-FEU — ABNORMAL HIGH (ref 0.00–0.50)

## 2020-09-02 LAB — LACTIC ACID, PLASMA
Lactic Acid, Venous: 1.1 mmol/L (ref 0.5–1.9)
Lactic Acid, Venous: 1 mmol/L (ref 0.5–1.9)

## 2020-09-02 LAB — HIV ANTIBODY (ROUTINE TESTING W REFLEX): HIV Screen 4th Generation wRfx: NONREACTIVE

## 2020-09-02 LAB — SARS CORONAVIRUS 2 BY RT PCR (HOSPITAL ORDER, PERFORMED IN ~~LOC~~ HOSPITAL LAB): SARS Coronavirus 2: POSITIVE — AB

## 2020-09-02 LAB — C-REACTIVE PROTEIN: CRP: 7.4 mg/dL — ABNORMAL HIGH (ref <1.0)

## 2020-09-02 LAB — LACTATE DEHYDROGENASE: LDH: 274 U/L — ABNORMAL HIGH (ref 98–192)

## 2020-09-02 MED ORDER — VITAMIN D3 25 MCG (1000 UNIT) PO TABS
2000.0000 [IU] | ORAL_TABLET | Freq: Every day | ORAL | Status: DC
  Administered 2020-09-03 – 2020-09-05 (×3): 2000 [IU] via ORAL
  Filled 2020-09-02 (×5): qty 2

## 2020-09-02 MED ORDER — SELENIUM 50 MCG PO TABS: 50.0000 ug | ORAL_TABLET | Freq: Two times a day (BID) | ORAL | Status: DC

## 2020-09-02 MED ORDER — ENOXAPARIN SODIUM 100 MG/ML ~~LOC~~ SOLN
100.0000 mg | SUBCUTANEOUS | Status: AC
  Administered 2020-09-02: 100 mg via SUBCUTANEOUS
  Filled 2020-09-02: qty 1

## 2020-09-02 MED ORDER — ALBUTEROL SULFATE HFA 108 (90 BASE) MCG/ACT IN AERS: 2.0000 | INHALATION_SPRAY | RESPIRATORY_TRACT | 0 refills | Status: DC | PRN

## 2020-09-02 MED ORDER — IOHEXOL 350 MG/ML SOLN
75.0000 mL | Freq: Once | INTRAVENOUS | Status: AC | PRN
  Administered 2020-09-02: 75 mL via INTRAVENOUS

## 2020-09-02 MED ORDER — ALBUTEROL SULFATE HFA 108 (90 BASE) MCG/ACT IN AERS: 1.0000 | INHALATION_SPRAY | RESPIRATORY_TRACT | Status: DC | PRN

## 2020-09-02 MED ORDER — DEXAMETHASONE SODIUM PHOSPHATE 10 MG/ML IJ SOLN
6.0000 mg | INTRAMUSCULAR | Status: DC
  Administered 2020-09-02: 6 mg via INTRAVENOUS
  Filled 2020-09-02: qty 1

## 2020-09-02 MED ORDER — ACETAMINOPHEN 650 MG RE SUPP: 650.0000 mg | Freq: Four times a day (QID) | RECTAL | Status: DC | PRN

## 2020-09-02 MED ORDER — ENOXAPARIN SODIUM 100 MG/ML ~~LOC~~ SOLN
100.0000 mg | Freq: Two times a day (BID) | SUBCUTANEOUS | Status: DC
  Administered 2020-09-03 – 2020-09-04 (×3): 100 mg via SUBCUTANEOUS
  Filled 2020-09-02 (×4): qty 1

## 2020-09-02 MED ORDER — AMOXICILLIN 500 MG PO TABS: 1000.0000 mg | ORAL_TABLET | Freq: Three times a day (TID) | ORAL | 0 refills | Status: DC

## 2020-09-02 MED ORDER — ASCORBIC ACID 500 MG PO TABS
1000.0000 mg | ORAL_TABLET | Freq: Every day | ORAL | Status: DC
  Administered 2020-09-02 – 2020-09-05 (×4): 1000 mg via ORAL
  Filled 2020-09-02 (×4): qty 2

## 2020-09-02 MED ORDER — ZINC GLUCONATE 50 MG PO TABS: 30.0000 mg | ORAL_TABLET | Freq: Every day | ORAL | Status: DC

## 2020-09-02 MED ORDER — GUAIFENESIN-DM 100-10 MG/5ML PO SYRP: 10.0000 mL | ORAL_SOLUTION | ORAL | Status: DC | PRN

## 2020-09-02 MED ORDER — AEROCHAMBER PLUS MISC: 2 refills | Status: DC

## 2020-09-02 MED ORDER — IPRATROPIUM-ALBUTEROL 20-100 MCG/ACT IN AERS
1.0000 | INHALATION_SPRAY | Freq: Four times a day (QID) | RESPIRATORY_TRACT | Status: DC
  Administered 2020-09-02 – 2020-09-03 (×3): 1 via RESPIRATORY_TRACT
  Filled 2020-09-02 (×2): qty 4

## 2020-09-02 MED ORDER — SODIUM CHLORIDE 0.9 % IV SOLN: INTRAVENOUS | Status: DC

## 2020-09-02 MED ORDER — METHYLPREDNISOLONE SODIUM SUCC 125 MG IJ SOLR
50.0000 mg | Freq: Two times a day (BID) | INTRAMUSCULAR | Status: DC
  Administered 2020-09-02 – 2020-09-04 (×4): 50 mg via INTRAVENOUS
  Filled 2020-09-02 (×4): qty 2

## 2020-09-02 MED ORDER — ACETAMINOPHEN 325 MG PO TABS: 650.0000 mg | ORAL_TABLET | Freq: Four times a day (QID) | ORAL | Status: DC | PRN

## 2020-09-02 NOTE — Discharge Instructions (Addendum)
2 puffs from your albuterol inhaler using your spacer every 4-6 hours as needed for shortness of breath.  Finish the amoxicillin, unless a provider tells you to stop.  I am treating you for secondary bacterial pneumonia although I think the inciting event was COVID infection.  I will contact you if your D-dimer comes back positive and you will need to go to the ER to rule out pulmonary embolus.

## 2020-09-02 NOTE — Progress Notes (Signed)
[facility] for lovenox Indication: acute PE  Allergies  Allergen Reactions  . Cefotan [Cefotetan] Rash    Developed rash during IV infusion- dose completed -benadryl given, no other issues occured    Patient Measurements: Height: 5' 5"  (165.1 cm) Weight: 99.8 kg (220 lb) IBW/kg (Calculated) : 57 Heparin Dosing Weight:   Vital Signs: Temp: 99.3 F (37.4 C) (01/26 0831) Temp Source: Oral (01/26 0831) BP: 153/95 (01/26 1515) Pulse Rate: 87 (01/26 1600)  Labs: Recent Labs    09/02/20 1309  HGB 13.9  HCT 42.5  PLT 620*  CREATININE 0.85    Estimated Creatinine Clearance: 94.7 mL/min (by C-G formula based on SCr of 0.85 mg/dL).   Assessment: Patient is a 50 y.o F presented to the ED on 1/26 with c/o SOB and cough.  COVID test came back positive and Ddimer was found to be elevated.  Chest CTA showed "segmental pulmonary emboli within the RIGHT upper, middle and lower lobes." Pharmacy has been consulted to start lovenox for acute PE.   Goal of Therapy:  Anti-Xa level 0.6-1 units/ml 4hrs after LMWH dose given Monitor platelets by anticoagulation protocol: Yes   Plan:  - lovenox 100 mg SQ q12h - cbc q72h - monitor for s/sx bleeding  Evolett Somarriba P 09/02/2020,4:14 PM

## 2020-09-02 NOTE — ED Triage Notes (Signed)
Sent from [facility] due to Pneumonia via chest x ray and elevated D Dimer. Pt sats 85% with decreasing anxiety by talking with her they increased to 93% RA

## 2020-09-02 NOTE — ED Provider Notes (Signed)
[facility] DEPT Provider Note   CSN: [id number] Arrival date & time: 09/02/20  1203     History Chief Complaint  Patient presents with  . Shortness of Breath    Sherry Marquez is a 50 y.o. female.  The history is provided by the patient.  Shortness of Breath Severity:  Moderate Onset quality:  Gradual Timing:  Constant Progression:  Worsening Chronicity:  New Relieved by:  Nothing Worsened by:  Nothing Ineffective treatments:  None tried Associated symptoms: cough and fever   Associated symptoms: no abdominal pain, no chest pain, no ear pain, no rash, no sore throat and no vomiting   Risk factors: no hx of PE/DVT        Past Medical History:  Diagnosis Date  . Allergy   . Eczema   . Environmental and seasonal allergies   . History of gestational diabetes   . LUMBAR SPRAIN AND STRAIN 12/05/2009   Qualifier: Diagnosis of  By: Sarajane Jews MD, Ishmael Holter   . Right ovarian cyst   . Rosacea     Patient Active Problem List   Diagnosis Date Noted  . Pneumonia due to COVID-19 virus 09/02/2020  . Rosacea   . Right ovarian cyst   . Hemorrhagic cyst of right ovary 01/30/2019  . Abdominal pain 01/29/2019  . Urinary tract infection symptoms 01/26/2019  . Foot pain - pes planus 10/29/2013  . Seasonal allergies 10/29/2013  . Depressed mood 10/29/2013  . HYPERLIPIDEMIA 08/20/2009    Past Surgical History:  Procedure Laterality Date  . CESAREAN SECTION  03-15-2005   dr Phineas Real  @[facility]   . LAPAROSCOPY N/A 05/20/2019   Procedure: LAPAROSCOPY DIAGNOSTIC;  Surgeon: Anastasio Auerbach, MD;  Location: [facility];  Service: Gynecology;  Laterality: N/A;  . WISDOM TOOTH EXTRACTION  2001     OB History    Gravida  3   Para  2   Term      Preterm      AB  1   Living  2     SAB  1   IAB      Ectopic  0   Multiple      Live Births              Family History  Problem Relation Age of Onset  . Heart disease Father         defibrillator  . Hypertension Father   . Heart attack Father 75  . CAD Father        4-way bypass  . Hypertension Brother   . Atrial fibrillation Mother   . Hypothyroidism Mother   . Lymphoma Maternal Grandmother 80  . Congestive Heart Failure Maternal Grandmother   . Stroke Maternal Grandfather 80  . Cancer Paternal Grandmother        mets to lung  . Heart attack Paternal Grandfather        early death    Social History   Tobacco Use  . Smoking status: Never Smoker  . Smokeless tobacco: Never Used  Vaping Use  . Vaping Use: Never used  Substance Use Topics  . Alcohol use: Yes    Comment: rare  . Drug use: Not Currently    Home Medications Prior to Admission medications   Medication Sig Start Date End Date Taking? Authorizing Provider  Ascorbic Acid (VITAMIN C) 1000 MG tablet Take 1,000 mg by mouth daily.   Yes [provider]  cetirizine (ZYRTEC) 10 MG  tablet Take 10 mg by mouth daily.   Yes [provider]  Cholecalciferol (VITAMIN D3) 50 MCG (2000 UT) TABS Take 2,000 Units by mouth daily.   Yes [provider]  selenium 50 MCG TABS tablet Take 50 mcg by mouth 2 (two) times daily.   Yes [provider]  zinc gluconate 50 MG tablet Take 30 mg by mouth daily.   Yes [provider]  albuterol (VENTOLIN HFA) 108 (90 Base) MCG/ACT inhaler Inhale 2 puffs into the lungs every 4 (four) hours as needed for wheezing or shortness of breath. Dispense with aerochamber 09/02/20   Melynda Ripple, MD  amoxicillin (AMOXIL) 500 MG tablet Take 2 tablets (1,000 mg total) by mouth 3 (three) times daily for 5 days. 09/02/20 09/07/20  Melynda Ripple, MD  Spacer/Aero-Holding Chambers (AEROCHAMBER PLUS) inhaler Use with inhaler 09/02/20   Melynda Ripple, MD    Allergies    Cefotan [cefotetan]  Review of Systems   Review of Systems  Constitutional: Positive for fever. Negative for chills.  HENT: Negative for ear pain and sore throat.    Eyes: Negative for pain and visual disturbance.  Respiratory: Positive for cough and shortness of breath.   Cardiovascular: Negative for chest pain and palpitations.  Gastrointestinal: Negative for abdominal pain and vomiting.  Genitourinary: Negative for dysuria and hematuria.  Musculoskeletal: Positive for myalgias. Negative for arthralgias and back pain.  Skin: Negative for color change and rash.  Neurological: Negative for seizures and syncope.  All other systems reviewed and are negative.   Physical Exam Updated Vital Signs BP (!) 155/94   Pulse 89   Resp (!) 24   Ht 5' 5"  (1.651 m)   Wt 99.8 kg   LMP 08/10/2020   SpO2 100%   BMI 36.61 kg/m   Physical Exam Vitals and nursing note reviewed.  Constitutional:      General: She is not in acute distress.    Appearance: She is well-developed and well-nourished. She is obese. She is not ill-appearing.  HENT:     Head: Normocephalic and atraumatic.  Eyes:     Conjunctiva/sclera: Conjunctivae normal.     Pupils: Pupils are equal, round, and reactive to light.  Cardiovascular:     Rate and Rhythm: Normal rate and regular rhythm.     Pulses: Normal pulses.     Heart sounds: Normal heart sounds. No murmur heard.   Pulmonary:     Effort: Tachypnea present. No respiratory distress.     Breath sounds: Decreased breath sounds present.  Abdominal:     Palpations: Abdomen is soft.     Tenderness: There is no abdominal tenderness.  Musculoskeletal:        General: No edema.     Cervical back: Normal range of motion and neck supple.     Right lower leg: No edema.     Left lower leg: No edema.  Skin:    General: Skin is warm and dry.     Capillary Refill: Capillary refill takes less than 2 seconds.  Neurological:     General: No focal deficit present.     Mental Status: She is alert.  Psychiatric:        Mood and Affect: Mood and affect normal.     ED Results / Procedures / Treatments   Labs (all labs ordered are  listed, but only abnormal results are displayed) Labs Reviewed  COMPREHENSIVE METABOLIC PANEL - Abnormal; Notable for the following components:  Result Value   Glucose, Bld 132 (*)    Albumin 3.3 (*)    All other components within normal limits  CBC WITH DIFFERENTIAL/PLATELET - Abnormal; Notable for the following components:   Platelets 620 (*)    All other components within normal limits  SARS CORONAVIRUS 2 BY RT PCR (HOSPITAL ORDER, [facility])  CULTURE, BLOOD (ROUTINE X 2)  CULTURE, BLOOD (ROUTINE X 2)  LACTIC ACID, PLASMA  LACTIC ACID, PLASMA  PROCALCITONIN  LACTATE DEHYDROGENASE  FERRITIN  TRIGLYCERIDES  FIBRINOGEN  C-REACTIVE PROTEIN  HIV ANTIBODY (ROUTINE TESTING W REFLEX)  I-STAT BETA HCG BLOOD, ED ([facility], [facility], [facility] ONLY)    EKG EKG Interpretation  Date/Time:  Wednesday September 02 2020 13:52:15 EST Ventricular Rate:  90 PR Interval:    QRS Duration: 83 QT Interval:  383 QTC Calculation: 469 R Axis:   52 Text Interpretation: Sinus rhythm Borderline T abnormalities, diffuse leads Confirmed by Lennice Sites [phone]) on 09/02/2020 2:12:19 PM   Radiology DG Chest 2 View  Result Date: 09/02/2020 CLINICAL DATA:  Dyspnea for 6 days EXAM: CHEST - 2 VIEW COMPARISON:  None. FINDINGS: Normal heart size. Normal mediastinal contour. No pneumothorax. No pleural effusion. Prominent patchy opacities throughout the peripheral lungs bilaterally, left greater than right. IMPRESSION: Prominent patchy opacities throughout the peripheral lungs bilaterally, left greater than right, suspicious for atypical/viral pneumonia such as due to COVID-19. Electronically Signed   By: Ilona Sorrel M.D.   On: 09/02/2020 09:39    Procedures .Critical Care Performed by: Lennice Sites, DO Authorized by: Lennice Sites, DO   Critical care provider statement:    Critical care time (minutes):  35   Critical care was necessary to treat or prevent imminent or life-threatening  deterioration of the following conditions:  Respiratory failure   Critical care was time spent personally by me on the following activities:  Blood draw for specimens, development of treatment plan with patient or surrogate, discussions with primary provider, evaluation of patient's response to treatment, examination of patient, obtaining history from patient or surrogate, ordering and performing treatments and interventions, ordering and review of laboratory studies, ordering and review of radiographic studies, re-evaluation of patient's condition, pulse oximetry and review of old charts   I assumed direction of critical care for this patient from another provider in my specialty: no       Medications Ordered in ED Medications  0.9 %  sodium chloride infusion ( Intravenous New Bag/Given 09/02/20 1430)  dexamethasone (DECADRON) injection 6 mg (6 mg Intravenous Given 09/02/20 1431)  iohexol (OMNIPAQUE) 350 MG/ML injection 75 mL (75 mLs Intravenous Contrast Given 09/02/20 1525)    ED Course  I have reviewed the triage vital signs and the nursing notes.  Pertinent labs & imaging results that were available during my care of the patient were reviewed by me and considered in my medical decision making (see chart for details).    MDM Rules/Calculators/A&P                          EDMUND RICK is a 50 year old female with no significant medical history presents the ED with shortness of breath, cough.  Patient hypoxic upon evaluation of the patient in the room.  She had just walked back to the room.  She was ambulating in the room and oxygen dropped to the low 80s.  Placed on 2 L of oxygen.  She became more tachypneic while walking as  well.  Overall suspect Covid pneumonia.  She had a chest x-ray at urgent care today that likely is consistent with Covid pneumonia.  She is not vaccinated.  They got a D-dimer at urgent care center for PE scan.  We will get remaining Covid screening labs, give a dose of IV  Decadron.  Will get CT scan.  However lab work ready shows no significant anemia, electrolyte abnormality, kidney injury.  Patient to be admitted to the hospitalist service.  Rapid Covid test has been ordered.  Hospitalist to follow-up with CT scan and remainder lab work.  Overall suspect respiratory failure in the setting of Covid pneumonia or other viral pneumonia.  This chart was dictated using voice recognition software.  Despite best efforts to proofread,  errors can occur which can change the documentation meaning.   Sherry Marquez was evaluated in Emergency Department on 09/02/2020 for the symptoms described in the history of present illness. She was evaluated in the context of the global COVID-19 pandemic, which necessitated consideration that the patient might be at risk for infection with the SARS-CoV-2 virus that causes COVID-19. Institutional protocols and algorithms that pertain to the evaluation of patients at risk for COVID-19 are in a state of rapid change based on information released by regulatory bodies including the CDC and federal and state organizations. These policies and algorithms were followed during the patient's care in the ED.   Final Clinical Impression(s) / ED Diagnoses Final diagnoses:  Acute respiratory failure with hypoxia ([place])  Viral pneumonia    Rx / DC Orders ED Discharge Orders    None       Lennice Sites, DO 09/02/20 1527

## 2020-09-02 NOTE — ED Notes (Signed)
Patient has a gold and blue top in the main lab

## 2020-09-02 NOTE — H&P (Signed)
History and Physical    Sherry Marquez CXK:[id number] DOB: 1970-12-01 DOA: 09/02/2020  PCP: Caren Macadam, MD   Patient coming from: home   Chief Complaint:  Dyspnea.   HPI: Sherry Marquez is a 50 y.o. female with medical history significant of obesity class 2, how presented with dyspnea.  Around January 1 patient developed a viral syndrome consistent with aches and pains, headache, generalized weakness and intermittent fevers.  Her husband had regular COVID-19 testing because of his work and he tested negative.  Her symptoms continue for about 14 days after which she noticed improvement with gradual resolution of her symptoms.  She never tested for COVID-19.  About 6 days ago patient noticed persistent and worsening dyspnea, worse with exertion, no improving factors, no associated chest pain or cough, no hemoptysis. Associated with intermittent left lower extremity cramps.  Today she was evaluated at a urgent care, her chest radiograph was abnormal and she was sent to the hospital for further evaluation.   ED Course:  Oxymetry 85% on room air. She tested positive for COVID-19, her chest radiograph had bilateral infiltrates in her chest CT was positive for pulmonary embolus. Patient received systemic steroids, full dose enoxaparin and supplemental oxygen.  Review of Systems:  1. General: positive fevers, no chills, as mentioned in HPI  2. ENT: No runny nose or sore throat, no hearing disturbances 3. Pulmonary: positive dyspnea, but not cough, wheezing, or hemoptysis 4. Cardiovascular: No angina, claudication, lower extremity edema, pnd or orthopnea 5. Gastrointestinal: No nausea or vomiting, no diarrhea or constipation 6. Hematology: No easy bruisability or frequent infections 7. Urology: No dysuria, hematuria or increased urinary frequency 8. Dermatology: No rashes. 9. Neurology: No seizures or paresthesias 10. Musculoskeletal: No joint pain or deformities  Past Medical  History:  Diagnosis Date  . Allergy   . Eczema   . Environmental and seasonal allergies   . History of gestational diabetes   . LUMBAR SPRAIN AND STRAIN 12/05/2009   Qualifier: Diagnosis of  By: Sarajane Jews MD, Ishmael Holter   . Right ovarian cyst   . Rosacea     Past Surgical History:  Procedure Laterality Date  . CESAREAN SECTION  03-15-2005   dr Phineas Real  @[facility]   . LAPAROSCOPY N/A 05/20/2019   Procedure: LAPAROSCOPY DIAGNOSTIC;  Surgeon: Anastasio Auerbach, MD;  Location: [facility];  Service: Gynecology;  Laterality: N/A;  . WISDOM TOOTH EXTRACTION  2001     reports that she has never smoked. She has never used smokeless tobacco. She reports current alcohol use. She reports previous drug use.  Allergies  Allergen Reactions  . Cefotan [Cefotetan] Rash    Developed rash during IV infusion- dose completed -benadryl given, no other issues occured    Family History  Problem Relation Age of Onset  . Heart disease Father        defibrillator  . Hypertension Father   . Heart attack Father 39  . CAD Father        4-way bypass  . Hypertension Brother   . Atrial fibrillation Mother   . Hypothyroidism Mother   . Lymphoma Maternal Grandmother 80  . Congestive Heart Failure Maternal Grandmother   . Stroke Maternal Grandfather 80  . Cancer Paternal Grandmother        mets to lung  . Heart attack Paternal Grandfather        early death     Prior to Admission medications   Medication Sig Start Date End  Date Taking? Authorizing Provider  Ascorbic Acid (VITAMIN C) 1000 MG tablet Take 1,000 mg by mouth daily.   Yes [provider]  cetirizine (ZYRTEC) 10 MG tablet Take 10 mg by mouth daily.   Yes [provider]  Cholecalciferol (VITAMIN D3) 50 MCG (2000 UT) TABS Take 2,000 Units by mouth daily.   Yes [provider]  selenium 50 MCG TABS tablet Take 50 mcg by mouth 2 (two) times daily.   Yes [provider]  zinc gluconate 50 MG tablet Take  30 mg by mouth daily.   Yes [provider]  albuterol (VENTOLIN HFA) 108 (90 Base) MCG/ACT inhaler Inhale 2 puffs into the lungs every 4 (four) hours as needed for wheezing or shortness of breath. Dispense with aerochamber 09/02/20   Melynda Ripple, MD  amoxicillin (AMOXIL) 500 MG tablet Take 2 tablets (1,000 mg total) by mouth 3 (three) times daily for 5 days. 09/02/20 09/07/20  Melynda Ripple, MD  Spacer/Aero-Holding Chambers (AEROCHAMBER PLUS) inhaler Use with inhaler 09/02/20   Melynda Ripple, MD    Physical Exam: Vitals:   09/02/20 1415 09/02/20 1430 09/02/20 1445 09/02/20 1500  BP: (!) 156/92 (!) 155/99 (!) 169/97 (!) 155/94  Pulse: 92 91 96 89  Resp: 14 (!) 29 17 (!) 24  SpO2: 99% 100% 100% 100%  Weight:      Height:        Vitals:   09/02/20 1415 09/02/20 1430 09/02/20 1445 09/02/20 1500  BP: (!) 156/92 (!) 155/99 (!) 169/97 (!) 155/94  Pulse: 92 91 96 89  Resp: 14 (!) 29 17 (!) 24  SpO2: 99% 100% 100% 100%  Weight:      Height:       General: positive dyspnea at rest.  Neurology: Awake and alert, non focal Head and Neck. Head normocephalic. Neck supple with no adenopathy or thyromegaly.  E ENT: no pallor, no icterus, oral mucosa moist Cardiovascular: No JVD. S1-S2 present, rhythmic, no gallops, rubs, or murmurs. No lower extremity edema. Pulmonary: positive breath sounds bilaterally, decreased air movement, but no wheezing, rhonchi or rales. Gastrointestinal. Abdomen soft and non tender Skin. Positive erythematous rash in the precordium.  Musculoskeletal: no joint deformities    Labs on Admission: I have personally reviewed following labs and imaging studies  CBC: Recent Labs  Lab 09/02/20 1309  WBC 9.0  NEUTROABS 7.2  HGB 13.9  HCT 42.5  MCV 92.8  PLT 202*   Basic Metabolic Panel: Recent Labs  Lab 09/02/20 1309  NA 136  K 4.0  CL 101  CO2 23  GLUCOSE 132*  BUN 10  CREATININE 0.85  CALCIUM 8.9   GFR: Estimated Creatinine  Clearance: 94.7 mL/min (by C-G formula based on SCr of 0.85 mg/dL). Liver Function Tests: Recent Labs  Lab 09/02/20 1309  AST 19  ALT 24  ALKPHOS 66  BILITOT 0.4  PROT 7.7  ALBUMIN 3.3*   No results for input(s): LIPASE, AMYLASE in the last 168 hours. No results for input(s): AMMONIA in the last 168 hours. Coagulation Profile: No results for input(s): INR, PROTIME in the last 168 hours. Cardiac Enzymes: No results for input(s): CKTOTAL, CKMB, CKMBINDEX, TROPONINI in the last 168 hours. BNP (last 3 results) No results for input(s): PROBNP in the last 8760 hours. HbA1C: No results for input(s): HGBA1C in the last 72 hours. CBG: No results for input(s): GLUCAP in the last 168 hours. Lipid Profile: No results for input(s): CHOL, HDL, LDLCALC, TRIG, CHOLHDL, LDLDIRECT in the  last 72 hours. Thyroid Function Tests: No results for input(s): TSH, T4TOTAL, FREET4, T3FREE, THYROIDAB in the last 72 hours. Anemia Panel: No results for input(s): VITAMINB12, FOLATE, FERRITIN, TIBC, IRON, RETICCTPCT in the last 72 hours. Urine analysis:    Component Value Date/Time   COLORURINE YELLOW 09/14/2016 0940   APPEARANCEUR CLEAR 09/14/2016 0940   LABSPEC 1.025 09/14/2016 0940   PHURINE 6.5 09/14/2016 0940   GLUCOSEU NEGATIVE 09/14/2016 0940   HGBUR NEGATIVE 09/14/2016 0940   BILIRUBINUR NEGATIVE 09/14/2016 0940   KETONESUR NEGATIVE 09/14/2016 0940   PROTEINUR NEGATIVE 09/14/2016 0940   UROBILINOGEN 0.2 07/23/2012 1107   NITRITE NEGATIVE 09/14/2016 0940   LEUKOCYTESUR NEGATIVE 09/14/2016 0940    Radiological Exams on Admission: DG Chest 2 View  Result Date: 09/02/2020 CLINICAL DATA:  Dyspnea for 6 days EXAM: CHEST - 2 VIEW COMPARISON:  None. FINDINGS: Normal heart size. Normal mediastinal contour. No pneumothorax. No pleural effusion. Prominent patchy opacities throughout the peripheral lungs bilaterally, left greater than right. IMPRESSION: Prominent patchy opacities throughout the  peripheral lungs bilaterally, left greater than right, suspicious for atypical/viral pneumonia such as due to COVID-19. Electronically Signed   By: Ilona Sorrel M.D.   On: 09/02/2020 09:39    EKG: Independently reviewed.  90 bpm, normal axis, normal intervals, sinus rhythm, small q and negative T in lead III, no significant ST segments.  Assessment/Plan Principal Problem:   Pneumonia due to COVID-19 virus Active Problems:   Seasonal allergies   Depressed mood   Rosacea   Dyslipidemia   Pulmonary embolism (HCC)   Acute respiratory failure with hypoxia (HCC)   Class 2 obesity  50 year old female with obesity class II dyslipidemia who presented with dyspnea on exertion which has been worsening.  Apparently she suffered a viral illness for about 14 days in early January, after mild improvement of her symptoms she had progressive and worsening dyspnea.  On her initial physical examination her oximetry was 85% on room air, blood pressure 155/94, heart rate 89, respiratory 24, oxygen saturation 98% on supplemental oxygen.  Her lungs had no wheezing, heart S1-S2 present, abdomen soft, no lower extremity edema. Sodium 136, potassium 4.0, chloride 101, bicarb 23, glucose 132, BUN 10, creatinine 0.85, white count 9.0, hemoglobin 13.9, hematocrit 42.5, platelets 620. SARS COVID-19 positive. Her chest radiograph had bilateral interstitial infiltrates, lower lobes, peripherally. CT chest with segmental pulmonary emboli within the right upper, middle and lower lobes.  Scattered groundglass airspace disease both lungs, predominantly at the bases.  Sherry Marquez will be admitted to the hospital working diagnosis of acute hypoxic respiratory failure due to SARS COVID-19 viral pneumonia complicated by acute pulmonary embolism.  1.  Acute hypoxic respiratory failure due to SARS COVID-19 viral pneumonia. By examining the timeline of her symptoms this is likely a sequela I of acute viral pneumonia.  She does have  signs of pulmonary fibrosis bilaterally at bases.  Will place patient on high-dose systemic corticosteroids with methylprednisolone 50 mg twice daily, supplemental oxygen per nasal cannula. Likely no benefit from remdesivir.  Close follow-up on inflammatory markers.  Hold on baricitinib for now.  COVID-19 Labs  Recent Labs    09/02/20 1000 09/02/20 1416  DDIMER 13.72*  --   FERRITIN  --  65  LDH  --  274*  CRP  --  7.4*    Lab Results  Component Value Date   SARSCOV2NAA POSITIVE (A) 09/02/2020   SARSCOV2NAA NOT DETECTED 05/16/2019    2.  Acute right-sided segmental pulmonary embolism.  Continue anticoagulation with therapeutic doses of enoxaparin. Supplemental oxygen per nasal cannula, further work-up with echocardiography.  Check ultrasonography lower extremities to assess clot burden.  3.  Obesity class II, dyslipidemia.  Patient has high risk for inpatient complications.  Her calculated BMI is 36.6.  4.  Depressed mood.  Stable.  Follow-up as an outpatient.  Status is: Inpatient  Remains inpatient appropriate because:IV treatments appropriate due to intensity of illness or inability to take PO   Dispo: The patient is from: Home              Anticipated d/c is to: Home              Anticipated d/c date is: 3 days              Patient currently is not medically stable to d/c.   Difficult to place patient No  DVT prophylaxis: Enoxaparin   Code Status:   full  Family Communication:  I spoke over the phone with the patient's husband about patient's  condition, plan of care, prognosis and all questions were addressed.    Consults called:  None   Admission status:  Inpatient    Sherry Shere Gerome Apley MD [facility]   09/02/2020, 3:19 PM

## 2020-09-02 NOTE — ED Notes (Signed)
Sherry Marquez- husband [phone]

## 2020-09-02 NOTE — ED Triage Notes (Signed)
Patient c/o SOB and nasal congestion x 6 days.   Patient has had "cold-like symptoms" since early January.   Patient has a period of symptoms subsiding and then symptoms came back as SOB.   Patient denies any COVID testing upon onset of symptoms.   Patient has taken supplements and Advil at home w/ no relief of symptoms.

## 2020-09-02 NOTE — ED Provider Notes (Signed)
HPI  SUBJECTIVE:  Sherry Marquez is a 50 y.o. female who presents with cough with deep inspiration, dyspnea on exertion and shortness of breath for the past 6 days.  She states that her oxygen at home has been 89 to the low 90s.  She states that she got "sick" on 1/2 with COVID/flu symptoms.  She states that she got better, then started having the shortness of breath 6 days ago. states that her husband was sick with similar symptoms, but he has been tested weekly and has tested negative for Covid.  She assumed that her symptoms were due to flu.  She denies fevers, wheezing, chest pressure or heaviness, pleuritic pain, hemoptysis.  She reports left lower extremity cramping last week which has since resolved.  She denies calf swelling.  No surgery in the past 4 weeks, recent immobilization.  She is not on exogenous estrogen, she is not a smoker.  She has not been tested for Covid yet.  No known exposure to the flu or known exposure to Covid.  She did not get the Covid vaccine.  She has tried pushing fluids, rest, Advil for the leg cramps.  She has not tried thing for the shortness of breath.  Shortness of breath is better with lying down and sitting, worse with bending forward and with exertion.  She has never had this like this before.  Past medical history no for DVT, PE, cancer, asthma, emphysema, COPD, diabetes, hypertension, chronic kidney disease, hypercholesterolemia, MI, coronary artery disease.  Family history negative for PE, DVT.  LMP: 1/3.  Denies the possibility of being pregnant.  [id number], Steele Berg, MD   Past Medical History:  Diagnosis Date  . Allergy   . Eczema   . Environmental and seasonal allergies   . History of gestational diabetes   . LUMBAR SPRAIN AND STRAIN 12/05/2009   Qualifier: Diagnosis of  By: Sarajane Jews MD, Ishmael Holter   . Right ovarian cyst   . Rosacea     Past Surgical History:  Procedure Laterality Date  . CESAREAN SECTION  03-15-2005   dr Phineas Real  @[facility]   . LAPAROSCOPY  N/A 05/20/2019   Procedure: LAPAROSCOPY DIAGNOSTIC;  Surgeon: Anastasio Auerbach, MD;  Location: [facility];  Service: Gynecology;  Laterality: N/A;  . WISDOM TOOTH EXTRACTION  2001    Family History  Problem Relation Age of Onset  . Heart disease Father        defibrillator  . Hypertension Father   . Heart attack Father 59  . CAD Father        4-way bypass  . Hypertension Brother   . Atrial fibrillation Mother   . Hypothyroidism Mother   . Lymphoma Maternal Grandmother 80  . Congestive Heart Failure Maternal Grandmother   . Stroke Maternal Grandfather 80  . Cancer Paternal Grandmother        mets to lung  . Heart attack Paternal Grandfather        early death    Social History   Tobacco Use  . Smoking status: Never Smoker  . Smokeless tobacco: Never Used  Vaping Use  . Vaping Use: Never used  Substance Use Topics  . Alcohol use: Yes    Comment: rare  . Drug use: Not Currently    No current facility-administered medications for this encounter.  Current Outpatient Medications:  .  albuterol (VENTOLIN HFA) 108 (90 Base) MCG/ACT inhaler, Inhale 2 puffs into the lungs every 4 (four) hours as needed for  wheezing or shortness of breath. Dispense with aerochamber, Disp: 1 each, Rfl: 0 .  amoxicillin (AMOXIL) 500 MG tablet, Take 2 tablets (1,000 mg total) by mouth 3 (three) times daily for 5 days., Disp: 30 tablet, Rfl: 0 .  Ascorbic Acid (VITAMIN C) 1000 MG tablet, Take 1,000 mg by mouth daily., Disp: , Rfl:  .  cetirizine (ZYRTEC) 10 MG tablet, Take 10 mg by mouth daily., Disp: , Rfl:  .  Cholecalciferol (VITAMIN D3) 50 MCG (2000 UT) TABS, Take by mouth daily., Disp: , Rfl:  .  Multiple Vitamins-Minerals (MULTIVITAMIN ADULT PO), Take by mouth daily., Disp: , Rfl:  .  selenium 50 MCG TABS tablet, Take 50 mcg by mouth 2 (two) times daily., Disp: , Rfl:  .  Spacer/Aero-Holding Chambers (AEROCHAMBER PLUS) inhaler, Use with inhaler, Disp: 1 each, Rfl: 2 .  zinc  gluconate 50 MG tablet, Take 30 mg by mouth daily., Disp: , Rfl:   Allergies  Allergen Reactions  . Cefotan [Cefotetan] Rash    Developed rash during IV infusion- dose completed -benadryl given, no other issues occured     ROS  As noted in HPI.   Physical Exam  BP (!) 140/96 (BP Location: Right Arm)   Pulse (!) 103   Temp 99.3 F (37.4 C) (Oral)   Resp 20   LMP 08/10/2020   SpO2 91%   Constitutional: Well developed, well nourished, no acute distress Eyes:  EOMI, conjunctiva normal bilaterally HENT: Normocephalic, atraumatic,mucus membranes moist Respiratory: Normal inspiratory effort, lungs clear bilaterally Cardiovascular: Regular tachycardia, no murmurs rubs or gallops GI: nondistended skin: No rash, skin intact Musculoskeletal: Calves symmetric, nontender, no edema Neurologic: Alert & oriented x 3, no focal neuro deficits Psychiatric: Speech and behavior appropriate   ED Course   Medications - No data to display  Orders Placed This Encounter  Procedures  . SARS CORONAVIRUS 2 (TAT 6-24 HRS) Nasopharyngeal Nasopharyngeal Swab    Standing Status:   Standing    Number of Occurrences:   1    Order Specific Question:   Is this test for diagnosis or screening    Answer:   Diagnosis of ill patient    Order Specific Question:   Symptomatic for COVID-19 as defined by CDC    Answer:   Yes    Order Specific Question:   Date of Symptom Onset    Answer:   08/27/2020    Order Specific Question:   Hospitalized for COVID-19    Answer:   No    Order Specific Question:   Admitted to ICU for COVID-19    Answer:   No    Order Specific Question:   Previously tested for COVID-19    Answer:   No    Order Specific Question:   Resident in a congregate (group) care setting    Answer:   No    Order Specific Question:   Employed in healthcare setting    Answer:   No    Order Specific Question:   Pregnant    Answer:   No    Order Specific Question:   Has patient completed COVID  vaccination(s) (2 doses of Pfizer/Moderna 1 dose of [employer])    Answer:   No  . DG Chest 2 View    Standing Status:   Standing    Number of Occurrences:   1    Order Specific Question:   Reason for Exam (SYMPTOM  OR DIAGNOSIS REQUIRED)    Answer:  SOB O2 sat 91  . D-dimer, quantitative    Standing Status:   Standing    Number of Occurrences:   1  . ED EKG    Standing Status:   Standing    Number of Occurrences:   1    Order Specific Question:   Reason for Exam    Answer:   Shortness of breath    Results for orders placed or performed during the hospital encounter of 09/02/20 (from the past 24 hour(s))  D-dimer, quantitative     Status: Abnormal   Collection Time: 09/02/20 10:00 AM  Result Value Ref Range   D-Dimer, Quant 13.72 (H) 0.00 - 0.50 ug/mL-FEU   DG Chest 2 View  Result Date: 09/02/2020 CLINICAL DATA:  Dyspnea for 6 days EXAM: CHEST - 2 VIEW COMPARISON:  None. FINDINGS: Normal heart size. Normal mediastinal contour. No pneumothorax. No pleural effusion. Prominent patchy opacities throughout the peripheral lungs bilaterally, left greater than right. IMPRESSION: Prominent patchy opacities throughout the peripheral lungs bilaterally, left greater than right, suspicious for atypical/viral pneumonia such as due to COVID-19. Electronically Signed   By: Ilona Sorrel M.D.   On: 09/02/2020 09:39    ED Clinical Impression  1. Community acquired pneumonia, unspecified laterality   2. Suspected COVID-19 virus infection   3. Shortness of breath      ED Assessment/Plan  Checking EKG, chest x-ray, D-dimer due to borderline hypoxia, tachycardia and history of calf cramping.  Discussed with patient that she will need to go to the ED if her D-dimer is positive.  Also checking COVID as she never got tested for it and did not get the vaccine.  Patient does not meet PERC criteria due to O2 sats and tachycardia.   EKG: Normal sinus rhythm, rate 93.  Normal axis, normal  intervals.  No hypertrophy.  No ST elevation.  Previous EKG for comparison  Reviewed imaging independently.  Prominent patchy opacities bilaterally left greater than right suspicious for atypical/viral pneumonia such as COVID.  See radiology report for details  EKG reassuring.  Chest x-ray suspicious for Covid/viral pneumonia.  However because COVID is a hypercoagulable state with the calf cramping last week, will still check D-dimer.  Will send home with albuterol inhaler with a spacer.  She is to keep an eye on her oxygen saturation and go to the ED if it goes below 90%.  She is out of the window for antiviral treatment as initial illness was on 1/2.  will treat as a secondary bacterial pneumonia given that she got better and then got worse.  Will send home with amoxicillin 1 g 3 times daily for 5 days.  D-dimer elevated.  Discussed this with patient.  Advised her to go to the [facility] or [facility] emergency department for further evaluation to rule out PE.  She agrees to go.  Discussed labs, imaging, MDM, treatment plan, and plan for follow-up with patient. Discussed sn/sx that should prompt return to the ED. patient agrees with plan.   Meds ordered this encounter  Medications  . amoxicillin (AMOXIL) 500 MG tablet    Sig: Take 2 tablets (1,000 mg total) by mouth 3 (three) times daily for 5 days.    Dispense:  30 tablet    Refill:  0  . albuterol (VENTOLIN HFA) 108 (90 Base) MCG/ACT inhaler    Sig: Inhale 2 puffs into the lungs every 4 (four) hours as needed for wheezing or shortness of breath. Dispense with aerochamber  Dispense:  1 each    Refill:  0  . Spacer/Aero-Holding Chambers (AEROCHAMBER PLUS) inhaler    Sig: Use with inhaler    Dispense:  1 each    Refill:  2    Please educate patient on use    *This clinic note was created using Dragon dictation software. Therefore, there may be occasional mistakes despite careful proofreading.   ?    Melynda Ripple, MD 09/02/20  1130

## 2020-09-03 ENCOUNTER — Inpatient Hospital Stay (HOSPITAL_COMMUNITY): Payer: 59

## 2020-09-03 DIAGNOSIS — R0600 Dyspnea, unspecified: Secondary | ICD-10-CM

## 2020-09-03 DIAGNOSIS — I2699 Other pulmonary embolism without acute cor pulmonale: Secondary | ICD-10-CM

## 2020-09-03 DIAGNOSIS — U071 COVID-19: Secondary | ICD-10-CM

## 2020-09-03 LAB — FERRITIN: Ferritin: 70 ng/mL (ref 11–307)

## 2020-09-03 LAB — CBC
HCT: 41.7 % (ref 36.0–46.0)
Hemoglobin: 13.6 g/dL (ref 12.0–15.0)
MCH: 30.1 pg (ref 26.0–34.0)
MCHC: 32.6 g/dL (ref 30.0–36.0)
MCV: 92.3 fL (ref 80.0–100.0)
Platelets: 598 10*3/uL — ABNORMAL HIGH (ref 150–400)
RBC: 4.52 MIL/uL (ref 3.87–5.11)
RDW: 14.3 % (ref 11.5–15.5)
WBC: 6.4 10*3/uL (ref 4.0–10.5)
nRBC: 0 % (ref 0.0–0.2)

## 2020-09-03 LAB — ECHOCARDIOGRAM LIMITED
Area-P 1/2: 4.15 cm2
Height: 65 in
S' Lateral: 3.4 cm
Weight: 3520 oz

## 2020-09-03 LAB — D-DIMER, QUANTITATIVE: D-Dimer, Quant: 8.67 ug/mL-FEU — ABNORMAL HIGH (ref 0.00–0.50)

## 2020-09-03 LAB — C-REACTIVE PROTEIN: CRP: 6.7 mg/dL — ABNORMAL HIGH (ref <1.0)

## 2020-09-03 MED ORDER — ALPRAZOLAM 0.25 MG PO TABS
0.2500 mg | ORAL_TABLET | Freq: Three times a day (TID) | ORAL | Status: DC | PRN
  Filled 2020-09-03: qty 1

## 2020-09-03 MED ORDER — PERFLUTREN LIPID MICROSPHERE
1.0000 mL | INTRAVENOUS | Status: AC | PRN
  Administered 2020-09-03: 2 mL via INTRAVENOUS
  Filled 2020-09-03: qty 10

## 2020-09-03 MED ORDER — IPRATROPIUM-ALBUTEROL 20-100 MCG/ACT IN AERS
1.0000 | INHALATION_SPRAY | Freq: Three times a day (TID) | RESPIRATORY_TRACT | Status: DC
  Administered 2020-09-04 – 2020-09-05 (×4): 1 via RESPIRATORY_TRACT

## 2020-09-03 NOTE — Progress Notes (Signed)
Bilateral lower extremity venous duplex has been completed. Preliminary results can be found in CV Proc through chart review.  Results were given to the patient's nurse, [place].  09/03/20 11:12 AM Carlos Levering RVT

## 2020-09-03 NOTE — Progress Notes (Signed)
  Echocardiogram 2D Echocardiogram has been performed.  Michiel Cowboy 09/03/2020, 8:43 AM

## 2020-09-03 NOTE — Progress Notes (Signed)
PROGRESS NOTE    JANIYHA MONTUFAR  HYQ:[id number] DOB: [birth year] DOA: 09/02/2020 PCP: Caren Macadam, MD    Brief Narrative:  Mrs. Randol was admitted to the hospital working diagnosis of acute hypoxic respiratory failure due to SARS COVID-19 viral pneumonia complicated by acute pulmonary embolism.  50 year old female with obesity class II dyslipidemia who presented with dyspnea on exertion which has been worsening.  Apparently she suffered a viral illness for about 14 days in early January, after mild improvement of her symptoms she had progressive and worsening dyspnea.  On her initial physical examination her oximetry was 85% on room air, blood pressure 155/94, heart rate 89, respiratory 24, oxygen saturation 98% on supplemental oxygen.  Her lungs had no wheezing, heart S1-S2 present, abdomen soft, no lower extremity edema. Sodium 136, potassium 4.0, chloride 101, bicarb 23, glucose 132, BUN 10, creatinine 0.85, white count 9.0, hemoglobin 13.9, hematocrit 42.5, platelets 620. SARS COVID-19 positive. Her chest radiograph had bilateral interstitial infiltrates, lower lobes, peripherally. CT chest with segmental pulmonary emboli within the right upper, middle and lower lobes.  Scattered groundglass airspace disease both lungs, predominantly at the bases.  Patient has been placed on systemic corticosteroids and therapeutic anticoagulation with enoxaparin.   Assessment & Plan:   Principal Problem:   Pneumonia due to COVID-19 virus Active Problems:   Seasonal allergies   Depressed mood   Rosacea   Dyslipidemia   Pulmonary embolism (HCC)   Acute respiratory failure with hypoxia (HCC)   Class 2 obesity   1. Acute hypoxemic respiratory failure due to SARS COVID 19 viral pneumonia.   RR: 15 to 20 Pulse oxymetry: 90 to 95%  Fi02: 2 L/min per [place]   COVID-19 Labs  Recent Labs    09/02/20 1000 09/02/20 1416 09/03/20 0642  DDIMER 13.72*  --  8.67*  FERRITIN  --  65 70  LDH   --  274*  --   CRP  --  7.4* 6.7*    Lab Results  Component Value Date   SARSCOV2NAA POSITIVE (A) 09/02/2020   SARSCOV2NAA POSITIVE (A) 09/02/2020   SARSCOV2NAA NOT DETECTED 05/16/2019    Inflammatory markers slowly trending down, she continue on 2 L/min per  per supplemental 02.   Continue medical therapy with methylprednisolone 50 mg IV q12 hrs. Out of the window for Remdesivir.  Considering improvement in CRP, continue to hold on baricitinib. On bronchodilator therapy and antitussive agents.   Out of bed to chair with meals TID, PT and OT evaluation.  Keep oxygen saturation 92% or greater.  Follow inflammatory markers in am.   2. Acute right side segmental pulmonary embolism/ acute left lower extremity DVT, left posterior tibial veins and left peroneal veins.  Today with no leg cramping. Her [place] positive for DVT on the left lower extremity.  Follow up on echocardiogram.  Patient is tolerating well full dose enoxaparin, plan to transition to apixaban in the next 24 to 48 hrs.   3. Obesity class 2. Calculated BMI is 36,6, will need outpatient follow up.   4. Depressed mood. Stable with no decompensations, follow up as outpatient.,   Status is: Inpatient  Remains inpatient appropriate because:IV treatments appropriate due to intensity of illness or inability to take PO   Dispo: The patient is from: Home              Anticipated d/c is to: Home              Anticipated d/c date is:  2 days              Patient currently is not medically stable to d/c.   Difficult to place patient No   DVT prophylaxis: Enoxaparin   Code Status:   full  Family Communication:  Husband on speaker phone at the time of my visit and all questions were addressed.     Subjective: Patient is feeling better, dyspnea has been improving, but not yet back to baseline, no lower extremities cramps, no nausea or vomiting.   Objective: Vitals:   09/03/20 1115 09/03/20 1130 09/03/20 1145 09/03/20 1200   BP:    138/90  Pulse: 85 87 93 88  Resp: 14 (!) 24 20 15   Temp:      TempSrc:      SpO2: 95% 94% 95% 90%  Weight:      Height:        Intake/Output Summary (Last 24 hours) at 09/03/2020 1416 Last data filed at 09/02/2020 2237 Gross per 24 hour  Intake 1000 ml  Output --  Net 1000 ml   Filed Weights   09/02/20 1213  Weight: 99.8 kg    Examination:   General: Not in pain or dyspnea.  Neurology: Awake and alert, non focal  E ENT: no pallor, no icterus, oral mucosa moist Cardiovascular: No JVD. S1-S2 present, rhythmic, no gallops, rubs, or murmurs. No lower extremity edema. Pulmonary: positive breath sounds bilaterally,  Gastrointestinal. Abdomen soft and non tender Skin. No rashes Musculoskeletal: no joint deformities     Data Reviewed: I have personally reviewed following labs and imaging studies  CBC: Recent Labs  Lab 09/02/20 1309 09/03/20 0642  WBC 9.0 6.4  NEUTROABS 7.2  --   HGB 13.9 13.6  HCT 42.5 41.7  MCV 92.8 92.3  PLT 620* 709*   Basic Metabolic Panel: Recent Labs  Lab 09/02/20 1309  NA 136  K 4.0  CL 101  CO2 23  GLUCOSE 132*  BUN 10  CREATININE 0.85  CALCIUM 8.9   GFR: Estimated Creatinine Clearance: 94.7 mL/min (by C-G formula based on SCr of 0.85 mg/dL). Liver Function Tests: Recent Labs  Lab 09/02/20 1309  AST 19  ALT 24  ALKPHOS 66  BILITOT 0.4  PROT 7.7  ALBUMIN 3.3*   No results for input(s): LIPASE, AMYLASE in the last 168 hours. No results for input(s): AMMONIA in the last 168 hours. Coagulation Profile: No results for input(s): INR, PROTIME in the last 168 hours. Cardiac Enzymes: No results for input(s): CKTOTAL, CKMB, CKMBINDEX, TROPONINI in the last 168 hours. BNP (last 3 results) No results for input(s): PROBNP in the last 8760 hours. HbA1C: No results for input(s): HGBA1C in the last 72 hours. CBG: No results for input(s): GLUCAP in the last 168 hours. Lipid Profile: Recent Labs    09/02/20 1416  TRIG  97   Thyroid Function Tests: No results for input(s): TSH, T4TOTAL, FREET4, T3FREE, THYROIDAB in the last 72 hours. Anemia Panel: Recent Labs    09/02/20 1416 09/03/20 0642  FERRITIN 65 70      Radiology Studies: I have reviewed all of the imaging during this hospital visit personally     Scheduled Meds: . vitamin C  1,000 mg Oral Daily  . cholecalciferol  2,000 Units Oral Daily  . enoxaparin (LOVENOX) injection  100 mg Subcutaneous Q12H  . Ipratropium-Albuterol  1 puff Inhalation Q6H  . methylPREDNISolone (SOLU-MEDROL) injection  50 mg Intravenous Q12H   Continuous Infusions:   LOS: 1  day        Markesha Hannig Gerome Apley, MD

## 2020-09-04 LAB — FERRITIN: Ferritin: 72 ng/mL (ref 11–307)

## 2020-09-04 LAB — C-REACTIVE PROTEIN: CRP: 2.3 mg/dL — ABNORMAL HIGH (ref <1.0)

## 2020-09-04 LAB — D-DIMER, QUANTITATIVE: D-Dimer, Quant: 5.47 ug/mL-FEU — ABNORMAL HIGH (ref 0.00–0.50)

## 2020-09-04 MED ORDER — METHYLPREDNISOLONE SODIUM SUCC 125 MG IJ SOLR
50.0000 mg | Freq: Every day | INTRAMUSCULAR | Status: DC
  Administered 2020-09-05: 50 mg via INTRAVENOUS
  Filled 2020-09-04: qty 2

## 2020-09-04 MED ORDER — APIXABAN 5 MG PO TABS
10.0000 mg | ORAL_TABLET | Freq: Two times a day (BID) | ORAL | Status: DC
  Administered 2020-09-04 – 2020-09-05 (×2): 10 mg via ORAL
  Filled 2020-09-04 (×2): qty 2

## 2020-09-04 MED ORDER — APIXABAN 5 MG PO TABS: 5.0000 mg | ORAL_TABLET | Freq: Two times a day (BID) | ORAL | Status: DC

## 2020-09-04 NOTE — Progress Notes (Signed)
PROGRESS NOTE    JOYLENE WESCOTT  ZOX:[id number] DOB: 02-04-1971 DOA: 09/02/2020 PCP: Caren Macadam, MD    Brief Narrative:  Mrs.Bhagatwas admitted to the hospital working diagnosis of acute hypoxic respiratory failure due to SARS COVID-19 viral pneumonia complicated by acute pulmonary embolism.  50 year old female with obesity class II dyslipidemia who presented with dyspnea on exertion which has been worsening. Apparently she suffered a viral illness for about 14 days in early January, after mild improvement of her symptoms she had progressive and worsening dyspnea. On her initial physical examination her oximetry was 85% on room air,blood pressure 155/94, heart rate 89, respiratory 24, oxygen saturation 98% on supplemental oxygen. Her lungs had no wheezing, heart S1-S2 present, abdomen soft, no lower extremity edema. Sodium 136, potassium 4.0, chloride 101, bicarb 23, glucose 132, BUN 10, creatinine 0.85, white count 9.0, hemoglobin 13.9, hematocrit 42.5, platelets 620. SARS COVID-19 positive. Her chest radiograph had bilateral interstitial infiltrates, lower lobes, peripherally. CT chest with segmental pulmonary emboli within the right upper, middle and lower lobes. Scattered groundglass airspace disease both lungs, predominantly at the bases.  Patient has been placed on systemic corticosteroids and therapeutic anticoagulation with enoxaparin.    Assessment & Plan:   Principal Problem:   Pneumonia due to COVID-19 virus Active Problems:   Seasonal allergies   Depressed mood   Rosacea   Dyslipidemia   Pulmonary embolism (HCC)   Acute respiratory failure with hypoxia (HCC)   Class 2 obesity   1. Acute hypoxemic respiratory failure due to SARS COVID 19 viral pneumonia.  RR: 17  Pulse oxymetry: 93%  Fi02: 3 L/min per [place].    COVID-19 Labs  Recent Labs    09/02/20 1000 09/02/20 1416 09/03/20 0642 09/04/20 0321  DDIMER 13.72*  --  8.67* 5.47*  FERRITIN  --   65 70 72  LDH  --  274*  --   --   CRP  --  7.4* 6.7* 2.3*    Lab Results  Component Value Date   SARSCOV2NAA POSITIVE (A) 09/02/2020   SARSCOV2NAA POSITIVE (A) 09/02/2020   SARSCOV2NAA NOT DETECTED 05/16/2019    Increase oxygen requirements to 3 L per min but continue to be less than 5. Inflammatory markers are trending down.   Reduce dose of methylprednisolone 50 mg IV q24 hrs. Out of the window for Remdesivir.   Continue with bronchodilator therapy and antitussive agents.   Continue to encourage mobility.  Will need ambulatory oxymetry on room air before discharge and a slow prednisone taper.   2. Acute right side segmental pulmonary embolism/ acute left lower extremity DVT, left posterior tibial veins and left peroneal veins.  Echocardiogram with preserved LV and RV systolic function.  Continue supplemental 02 per [place], transition to oral apixaban.   3. Obesity class 2. Calculated BMI is 36,6, out patient follow up.   4. Depressed mood. As needed alprazolam for anxiety.,    Status is: Inpatient  Remains inpatient appropriate because:Inpatient level of care appropriate due to severity of illness   Dispo: The patient is from: Home              Anticipated d/c is to: Home              Anticipated d/c date is: 1 day              Patient currently is not medically stable to d/c.   Difficult to place patient No   DVT prophylaxis: apixaban   Code  Status:   full  Family Communication:  Her husband on speaker phone during my visit, all questions were addressed.      Subjective: Patient continue to have dyspnea on exertion, no nausea or vomiting, had increased oxygen requirements to 3 L per min   Objective: Vitals:   09/03/20 2016 09/04/20 0149 09/04/20 0526 09/04/20 0802  BP: (!) 148/84 (!) 149/86 (!) 150/89   Pulse: 76 68 70   Resp: 19 16 17    Temp: 98.2 F (36.8 C) 98.1 F (36.7 C) 98.4 F (36.9 C)   TempSrc: Oral Oral    SpO2: 95% 95% 96% 93%  Weight:       Height:        Intake/Output Summary (Last 24 hours) at 09/04/2020 1054 Last data filed at 09/04/2020 0940 Gross per 24 hour  Intake 610 ml  Output 2100 ml  Net -1490 ml   Filed Weights   09/02/20 1213  Weight: 99.8 kg    Examination:   General:deconditioned  Neurology: Awake and alert, non focal  E ENT: no pallor, no icterus, oral mucosa moist Cardiovascular: No JVD. S1-S2 present, rhythmic, no gallops, rubs, or murmurs. No lower extremity edema. Pulmonary: vesicular breath sounds bilaterally,  Gastrointestinal. Abdomen soft and non tender Skin. No rashes Musculoskeletal: no joint deformities     Data Reviewed: I have personally reviewed following labs and imaging studies  CBC: Recent Labs  Lab 09/02/20 1309 09/03/20 0642  WBC 9.0 6.4  NEUTROABS 7.2  --   HGB 13.9 13.6  HCT 42.5 41.7  MCV 92.8 92.3  PLT 620* 627*   Basic Metabolic Panel: Recent Labs  Lab 09/02/20 1309  NA 136  K 4.0  CL 101  CO2 23  GLUCOSE 132*  BUN 10  CREATININE 0.85  CALCIUM 8.9   GFR: Estimated Creatinine Clearance: 94.7 mL/min (by C-G formula based on SCr of 0.85 mg/dL). Liver Function Tests: Recent Labs  Lab 09/02/20 1309  AST 19  ALT 24  ALKPHOS 66  BILITOT 0.4  PROT 7.7  ALBUMIN 3.3*   No results for input(s): LIPASE, AMYLASE in the last 168 hours. No results for input(s): AMMONIA in the last 168 hours. Coagulation Profile: No results for input(s): INR, PROTIME in the last 168 hours. Cardiac Enzymes: No results for input(s): CKTOTAL, CKMB, CKMBINDEX, TROPONINI in the last 168 hours. BNP (last 3 results) No results for input(s): PROBNP in the last 8760 hours. HbA1C: No results for input(s): HGBA1C in the last 72 hours. CBG: No results for input(s): GLUCAP in the last 168 hours. Lipid Profile: Recent Labs    09/02/20 1416  TRIG 97   Thyroid Function Tests: No results for input(s): TSH, T4TOTAL, FREET4, T3FREE, THYROIDAB in the last 72 hours. Anemia  Panel: Recent Labs    09/03/20 0350 09/04/20 0321  FERRITIN 70 72      Radiology Studies: I have reviewed all of the imaging during this hospital visit personally     Scheduled Meds: . vitamin C  1,000 mg Oral Daily  . cholecalciferol  2,000 Units Oral Daily  . enoxaparin (LOVENOX) injection  100 mg Subcutaneous Q12H  . Ipratropium-Albuterol  1 puff Inhalation TID  . methylPREDNISolone (SOLU-MEDROL) injection  50 mg Intravenous Q12H   Continuous Infusions:   LOS: 2 days        Mauricio Gerome Apley, MD

## 2020-09-04 NOTE — Discharge Instructions (Signed)
Information on my medicine - ELIQUIS (apixaban)  This medication education was reviewed with me or my healthcare representative as part of my discharge preparation.  Why was Eliquis prescribed for you? Eliquis was prescribed to treat blood clots that may have been found in the veins of your legs (deep vein thrombosis) or in your lungs (pulmonary embolism) and to reduce the risk of them occurring again.  What do You need to know about Eliquis ? The starting dose is 10 mg (two 5 mg tablets) taken TWICE daily for the FIRST SEVEN (7) DAYS, then on Saturday 09/12/2020  the dose is reduced to ONE 5 mg tablet taken TWICE daily.  Eliquis may be taken with or without food.   Try to take the dose about the same time in the morning and in the evening. If you have difficulty swallowing the tablet whole please discuss with your pharmacist how to take the medication safely.  Take Eliquis exactly as prescribed and DO NOT stop taking Eliquis without talking to the doctor who prescribed the medication.  Stopping may increase your risk of developing a new blood clot.  Refill your prescription before you run out.  After discharge, you should have regular check-up appointments with your healthcare provider that is prescribing your Eliquis.    What do you do if you miss a dose? If a dose of ELIQUIS is not taken at the scheduled time, take it as soon as possible on the same day and twice-daily administration should be resumed. The dose should not be doubled to make up for a missed dose.  Important Safety Information A possible side effect of Eliquis is bleeding. You should call your healthcare provider right away if you experience any of the following: ? Bleeding from an injury or your nose that does not stop. ? Unusual colored urine (red or dark brown) or unusual colored stools (red or black). ? Unusual bruising for unknown reasons. ? A serious fall or if you hit your head (even if there is no  bleeding).  Some medicines may interact with Eliquis and might increase your risk of bleeding or clotting while on Eliquis. To help avoid this, consult your healthcare provider or pharmacist prior to using any new prescription or non-prescription medications, including herbals, vitamins, non-steroidal anti-inflammatory drugs (NSAIDs) and supplements.  This website has more information on Eliquis (apixaban): http://[email or web address].com/eliquis/home

## 2020-09-04 NOTE — Evaluation (Signed)
Physical Therapy Evaluation Patient Details Name: Sherry Marquez MRN: [id number] DOB: [birth year] Today's Date: 09/04/2020   History of Present Illness  Sherry Marquez is a 50 year old woman admitted to the hospital working diagnosis of acute hypoxic respiratory failure due to SARS COVID-19 viral pneumonia complicated by acute pulmonary embolism.  Clinical Impression  [name] is a 50 year old woman normally independent who lives at home with her husband and two teenage children. On evaluation patient presents with decreased activity tolerance and impaired cardiopulmonary status limiting mobility and normal ADL tasks. Pt performs bed mobility and transfers unassisted with just steady assist for ambulation. Patient educated on use of breathing techniques, breathing devices, prone positioning and mobility for recovery. Patient verbalized understanding. Patient will benefit from skilled PT services to improve activity tolerance and cardiopulmonary status  to reduce oxygenation needs in order for patient to return home at discharge.     Follow Up Recommendations No PT follow up    Equipment Recommendations  None recommended by PT    Recommendations for Other Services       Precautions / Restrictions Precautions Precautions: Other (comment) Precaution Comments: monitor sats Restrictions Weight Bearing Restrictions: No      Mobility  Bed Mobility Overal bed mobility: Modified Independent                  Transfers Overall transfer level: Modified independent Equipment used: None                Ambulation/Gait Ambulation/Gait assistance: Min guard Gait Distance (Feet): 25 Feet (3 times with seated rest break between 2* desat) Assistive device: None Gait Pattern/deviations: Step-through pattern;Decreased step length - right;Decreased step length - left;Shuffle Gait velocity: decr   General Gait Details: decreased pace with mild instability and c/o mild  sizziness  Stairs            Wheelchair Mobility    Modified Rankin (Stroke Patients Only)       Balance Overall balance assessment: Mild deficits observed, not formally tested                                           Pertinent Vitals/Pain Pain Assessment: No/denies pain    Home Living Family/patient expects to be discharged to:: Private residence Living Arrangements: Spouse/significant other Available Help at Discharge: Family Type of Home: House Home Access: Stairs to enter     Home Layout: Two level Home Equipment: None      Prior Function Level of Independence: Independent               Hand Dominance   Dominant Hand: Right    Extremity/Trunk Assessment   Upper Extremity Assessment Upper Extremity Assessment: Overall WFL for tasks assessed    Lower Extremity Assessment Lower Extremity Assessment: Overall WFL for tasks assessed    Cervical / Trunk Assessment Cervical / Trunk Assessment: Normal  Communication   Communication: No difficulties  Cognition Arousal/Alertness: Awake/alert Behavior During Therapy: WFL for tasks assessed/performed Overall Cognitive Status: Within Functional Limits for tasks assessed                                        General Comments      Exercises General Exercises - Lower Extremity Ankle Circles/Pumps: AROM;15  reps;Supine;Both   Assessment/Plan    PT Assessment Patient needs continued PT services  PT Problem List Decreased activity tolerance;Decreased balance;Decreased mobility;Obesity       PT Treatment Interventions DME instruction;Gait training;Stair training;Functional mobility training;Therapeutic activities;Therapeutic exercise;Balance training;Patient/family education    PT Goals (Current goals can be found in the Care Plan section)  Acute Rehab PT Goals Patient Stated Goal: To regain endurance and get off oxygen PT Goal Formulation: With patient Time  For Goal Achievement: 09/18/20 Potential to Achieve Goals: Good    Frequency Min 3X/week   Barriers to discharge        Co-evaluation               AM-PAC PT "6 Clicks" Mobility  Outcome Measure Help needed turning from your back to your side while in a flat bed without using bedrails?: None Help needed moving from lying on your back to sitting on the side of a flat bed without using bedrails?: None Help needed moving to and from a bed to a chair (including a wheelchair)?: A Little Help needed standing up from a chair using your arms (e.g., wheelchair or bedside chair)?: None Help needed to walk in hospital room?: A Little Help needed climbing 3-5 steps with a railing? : A Lot 6 Click Score: 20    End of Session Equipment Utilized During Treatment: Oxygen Activity Tolerance: Patient limited by fatigue;Other (comment) (desat) Patient left: in bed;with call bell/phone within reach Nurse Communication: Mobility status PT Visit Diagnosis: Unsteadiness on feet (R26.81);Difficulty in walking, not elsewhere classified (R26.2)    Time: 0086-7619 PT Time Calculation (min) (ACUTE ONLY): 27 min   Charges:   PT Evaluation $PT Eval Low Complexity: 1 Low PT Treatments $Gait Training: 8-22 mins        Debe Coder PT Acute Rehabilitation Services Pager [phone] Office [phone]   Sherry Marquez 09/04/2020, 2:55 PM

## 2020-09-04 NOTE — Evaluation (Signed)
Occupational Therapy Evaluation Patient Details Name: Sherry Marquez MRN: [id number] DOB: 07/08/1971 Today's Date: 09/04/2020    History of Present Illness Mrs. Helmer is a 50 year old woman admitted to the hospital working diagnosis of acute hypoxic respiratory failure due to SARS COVID-19 viral pneumonia complicated by acute pulmonary embolism.   Clinical Impression   Mrs. Cherylin Waguespack is a 50 year old woman normally independent who lives at home with her husband and two teenage children. On evaluation patient presents with decreased activity tolerance and impaired cardiopulmonary status limiting mobility and normal ADL tasks. Patient does not require physical assistance for ADLs but does need use of BSC and rest breaks to recover. Patient educated on use of breathing techniques, breathing devices, prone positioning and mobility for recovery. Patient verbalized understanding. Patient will benefit from skilled OT services to improve activity tolerance and cardiopulmonary status  to reduce oxygenation needs in order for patient to return home at discharge.     Follow Up Recommendations  No OT follow up    Equipment Recommendations  Tub/shower seat    Recommendations for Other Services       Precautions / Restrictions Precautions Precautions: Other (comment) Precaution Comments: monitor sats Restrictions Weight Bearing Restrictions: No      Mobility Bed Mobility Overal bed mobility: Modified Independent                  Transfers Overall transfer level: Modified independent                    Balance Overall balance assessment: No apparent balance deficits (not formally assessed)                                         ADL either performed or assessed with clinical judgement   ADL Overall ADL's : Modified independent                                       General ADL Comments: Needs increased time and rest breaks for all  ADLs but no physical assistance needed. Unable to ambulate to bathroom at this time and using BSC.     Vision Patient Visual Report: No change from baseline       Perception     Praxis      Pertinent Vitals/Pain Pain Assessment: No/denies pain     Hand Dominance Right   Extremity/Trunk Assessment Upper Extremity Assessment Upper Extremity Assessment: Overall WFL for tasks assessed   Lower Extremity Assessment Lower Extremity Assessment: Defer to PT evaluation   Cervical / Trunk Assessment Cervical / Trunk Assessment: Normal   Communication Communication Communication: No difficulties   Cognition Arousal/Alertness: Awake/alert Behavior During Therapy: WFL for tasks assessed/performed Overall Cognitive Status: Within Functional Limits for tasks assessed                                     General Comments       Exercises     Shoulder Instructions      Home Living Family/patient expects to be discharged to:: Private residence Living Arrangements: Spouse/significant other Available Help at Discharge: Family Type of Home: House Home Access: Stairs to enter     Home Layout:  Two level     Bathroom Shower/Tub: Tub/shower unit         Home Equipment: None          Prior Functioning/Environment Level of Independence: Independent                 OT Problem List: Cardiopulmonary status limiting activity;Decreased activity tolerance      OT Treatment/Interventions: Therapeutic activities;Self-care/ADL training;Therapeutic exercise;DME and/or AE instruction;Patient/family education    OT Goals(Current goals can be found in the care plan section) Acute Rehab OT Goals Patient Stated Goal: To regain endurance and get off oxygen OT Goal Formulation: With patient Time For Goal Achievement: 09/18/20 Potential to Achieve Goals: Good  OT Frequency: Min 2X/week   Barriers to D/C:            Co-evaluation              AM-PAC OT  "6 Clicks" Daily Activity     Outcome Measure Help from another person eating meals?: None Help from another person taking care of personal grooming?: None Help from another person toileting, which includes using toliet, bedpan, or urinal?: None Help from another person bathing (including washing, rinsing, drying)?: None Help from another person to put on and taking off regular upper body clothing?: None Help from another person to put on and taking off regular lower body clothing?: None 6 Click Score: 24   End of Session Nurse Communication: Mobility status  Activity Tolerance: Patient tolerated treatment well Patient left: in chair;with call bell/phone within reach  OT Visit Diagnosis: Other (comment) (COVID pna)                Time: 1610-9604 OT Time Calculation (min): 25 min Charges:  OT General Charges $OT Visit: 1 Visit OT Evaluation $OT Eval Low Complexity: 1 Low OT Treatments $Therapeutic Activity: 8-22 mins  Rodman Recupero, OTR/L [place]  Office [phone] Pager: [phone]   Lenward Chancellor 09/04/2020, 1:41 PM

## 2020-09-04 NOTE — Progress Notes (Signed)
[place] for lovenox>> apixaban Indication: acute PE  Allergies  Allergen Reactions  . Cefotan [Cefotetan] Rash    Developed rash during IV infusion- dose completed -benadryl given, no other issues occured    Patient Measurements: Height: 5' 5"  (165.1 cm) Weight: 99.8 kg (220 lb) IBW/kg (Calculated) : 57 Heparin Dosing Weight:   Vital Signs: Temp: 98.4 F (36.9 C) (01/28 0526) Temp Source: Oral (01/28 0149) BP: 150/89 (01/28 0526) Pulse Rate: 70 (01/28 0526)  Labs: Recent Labs    09/02/20 1309 09/03/20 0642  HGB 13.9 13.6  HCT 42.5 41.7  PLT 620* 598*  CREATININE 0.85  --     Estimated Creatinine Clearance: 94.7 mL/min (by C-G formula based on SCr of 0.85 mg/dL).   Assessment: Patient is a 50 y.o F presented to the ED on 1/26 with c/o SOB and cough.  COVID test came back positive and Ddimer was found to be elevated.  Chest CTA showed "segmental pulmonary emboli within the RIGHT upper, middle and lower lobes."  09/04/2020  Pharmacy has been consulted transition lovenox for acute PE to apixaban. LMWH 100 sq q12 hr, LD 6 am, CBC stable, no bleeding reported  Goal of Therapy:  Anti-Xa level 0.6-1 units/ml 4hrs after LMWH dose given Monitor platelets by anticoagulation protocol: Yes   Plan:  DC LMWH  Apixaban 10 mg po bid x 7 days followed by apixaban 5 mg po bid Will provide 30 day free card & education prior to discharge  Eudelia Bunch, Pharm.D 09/04/2020 1:01 PM

## 2020-09-05 LAB — FERRITIN: Ferritin: 58 ng/mL (ref 11–307)

## 2020-09-05 LAB — D-DIMER, QUANTITATIVE: D-Dimer, Quant: 5.06 ug/mL-FEU — ABNORMAL HIGH (ref 0.00–0.50)

## 2020-09-05 LAB — C-REACTIVE PROTEIN: CRP: 0.9 mg/dL (ref <1.0)

## 2020-09-05 MED ORDER — PREDNISONE 20 MG PO TABS: 40.0000 mg | ORAL_TABLET | Freq: Every day | ORAL | Status: DC

## 2020-09-05 MED ORDER — ALBUTEROL SULFATE HFA 108 (90 BASE) MCG/ACT IN AERS: 1.0000 | INHALATION_SPRAY | RESPIRATORY_TRACT | 0 refills | Status: DC | PRN

## 2020-09-05 MED ORDER — ACETAMINOPHEN 325 MG PO TABS: 650.0000 mg | ORAL_TABLET | Freq: Four times a day (QID) | ORAL | Status: DC | PRN

## 2020-09-05 MED ORDER — GUAIFENESIN-DM 100-10 MG/5ML PO SYRP: 5.0000 mL | ORAL_SOLUTION | Freq: Four times a day (QID) | ORAL | 0 refills | Status: DC | PRN

## 2020-09-05 MED ORDER — PREDNISONE 10 MG PO TABS: ORAL_TABLET | ORAL | 0 refills | Status: DC

## 2020-09-05 MED ORDER — APIXABAN 5 MG PO TABS: ORAL_TABLET | ORAL | 0 refills | Status: DC

## 2020-09-05 NOTE — Progress Notes (Signed)
Physical Therapy Treatment Patient Details Name: Sherry Marquez MRN: [id number] DOB: [birth year] Today's Date: 09/05/2020    History of Present Illness Sherry Marquez is a 50 year old woman admitted to the hospital working diagnosis of acute hypoxic respiratory failure due to SARS COVID-19 viral pneumonia complicated by acute pulmonary embolism.    PT Comments    Pt in good spirits and with good progress with mobility.  Pt demonstrating improved activity tolerance, decreased recovery time from SOB, and good awareness of pacing and breathing techniques.  Pt hopeful for dc home this date.  Follow Up Recommendations  No PT follow up     Equipment Recommendations  None recommended by PT    Recommendations for Other Services       Precautions / Restrictions Precautions Precaution Comments: monitor sats Restrictions Weight Bearing Restrictions: No    Mobility  Bed Mobility Overal bed mobility: Modified Independent                Transfers Overall transfer level: Modified independent Equipment used: None                Ambulation/Gait Ambulation/Gait assistance: Supervision;Independent Gait Distance (Feet): 50 Feet (50', 35;, 50', 35') Assistive device: None Gait Pattern/deviations: Step-through pattern;Decreased step length - right;Decreased step length - left;Shuffle Gait velocity: decr   General Gait Details: decreased pace but with improved stability   Stairs             Wheelchair Mobility    Modified Rankin (Stroke Patients Only)       Balance Overall balance assessment: Mild deficits observed, not formally tested                                          Cognition Arousal/Alertness: Awake/alert Behavior During Therapy: WFL for tasks assessed/performed Overall Cognitive Status: Within Functional Limits for tasks assessed                                        Exercises General Exercises - Lower  Extremity Ankle Circles/Pumps: AROM;15 reps;Supine;Both Long Arc Quad: AROM;Both;10 reps;Seated    General Comments        Pertinent Vitals/Pain Pain Assessment: No/denies pain    Home Living                      Prior Function            PT Goals (current goals can now be found in the care plan section) Acute Rehab PT Goals Patient Stated Goal: To regain endurance and get off oxygen PT Goal Formulation: With patient Time For Goal Achievement: 09/18/20 Potential to Achieve Goals: Good Progress towards PT goals: Progressing toward goals    Frequency    Min 3X/week      PT Plan Current plan remains appropriate    Co-evaluation              AM-PAC PT "6 Clicks" Mobility   Outcome Measure  Help needed turning from your back to your side while in a flat bed without using bedrails?: None Help needed moving from lying on your back to sitting on the side of a flat bed without using bedrails?: None Help needed moving to and from a bed to a chair (including a wheelchair)?: None  Help needed standing up from a chair using your arms (e.g., wheelchair or bedside chair)?: None Help needed to walk in hospital room?: None Help needed climbing 3-5 steps with a railing? : A Little 6 Click Score: 23    End of Session Equipment Utilized During Treatment: Oxygen Activity Tolerance: Patient limited by fatigue;Other (comment) Patient left: Other (comment) (sitting EOB) Nurse Communication: Mobility status PT Visit Diagnosis: Unsteadiness on feet (R26.81);Difficulty in walking, not elsewhere classified (R26.2)     Time: 1005-1036 PT Time Calculation (min) (ACUTE ONLY): 31 min  Charges:  $Gait Training: 8-22 mins $Therapeutic Activity: 8-22 mins                     Debe Coder PT Acute Rehabilitation Services Pager [phone] Office [phone]    Henriette Hesser 09/05/2020, 12:50 PM

## 2020-09-05 NOTE — Discharge Summary (Addendum)
Physician Discharge Summary  Sherry Marquez IHK:[id number] DOB: March 28, 1971 DOA: 09/02/2020  PCP: Caren Macadam, MD  Admit date: 09/02/2020 Discharge date: 09/05/2020  Admitted From: home  Disposition:  Home   Recommendations for Outpatient Follow-up and new medication changes:  1. Follow up with Dr. Ethlyn Gallery in 2 weeks. 2. Follow up with post COVID pulmonary clinic. 3. Continue full anticoagulation with apixaban. 4. Slow taper steroids 5. Supplemental home 02.  6. Continue self quarantine for the next 10 days, use a mask in public and maintain physical distancing.   Home Health: no   Equipment/Devices: home 02    Discharge Condition: stable  CODE STATUS: full  Diet recommendation: heart healthy   Brief/Interim Summary: SherryBhagatwasadmitted to the hospital with the working diagnosis of acute hypoxic respiratory failure due to SARS COVID-19 viral pneumonia complicated by acute pulmonary embolism/ left lower extremity DVT.  50 year old female with obesity class II and dyslipidemia who presented with dyspnea on exertion which has been worsening. Apparently she suffered a viral illness for about 14 days in early January, after mild improvement of her symptoms she had progressive and worsening dyspnea. On her initial physical examination her oximetry was 85% on room air,blood pressure 155/94, heart rate 89, respiratory rate 24, oxygen saturation 98% on supplemental oxygen. Her lungs had no wheezing, heart S1-S2 present, abdomen soft, no lower extremity edema. Sodium 136, potassium 4.0, chloride 101, bicarb 23, glucose 132, BUN 10, creatinine 0.85, white count 9.0, hemoglobin 13.9, hematocrit 42.5, platelets 620. SARS COVID-19 positive.  Her chest radiograph had bilateral interstitial infiltrates, lower lobes, peripherally. CT chest with segmental pulmonary emboli within the right upper, middle and lower lobes. Scattered groundglass airspace disease both lungs, predominantly  at the bases.  Patient was placed on systemic corticosteroids and therapeutic anticoagulation with enoxaparin.  Successfully transition to oral apixaban. Plan to continue slow taper steroids and follow with post COVID pulmonary clinic.   1.  Acute hypoxemic respiratory failure due to SARS COVID-19 viral pneumonia. Patient was admitted to the medical ward, she received systemic corticosteroids with methylprednisolone, supplemental oxygen per nasal cannula, antitussive agents, bronchodilators and airway clearing techniques.  COVID-19 Labs  Recent Labs    09/02/20 1416 09/03/20 0642 09/04/20 0321 09/05/20 0241  DDIMER  --  8.67* 5.47* 5.06*  FERRITIN 65 70 72 58  LDH 274*  --   --   --   CRP 7.4* 6.7* 2.3* 0.9    Lab Results  Component Value Date   SARSCOV2NAA POSITIVE (A) 09/02/2020   SARSCOV2NAA POSITIVE (A) 09/02/2020   SARSCOV2NAA NOT DETECTED 05/16/2019     Considering timing of her symptoms she was considered not candidate for remdesivir.  This is likely a sequela from her initial COVID viral illness (early January).  Decision was made to discharge patient with a slow taper of oral prednisone, supplemental oxygen per nasal cannula and follow-up as an outpatient with a post Covid pulmonary clinic.  2.  Acute right-sided segmental pulmonary embolism, acute left lower extremity deep vein thrombosis, left posterior tibial veins and left peroneal veins. She received full dose anticoagulation with enoxaparin, successfully transitioned to oral apixaban. Further work-up with echocardiography revealed a preserved LV and RV systolic function. This is considered a provoked deep vein thrombosis, likely will need between 4 to 6 months of anticoagulation.  3.  Obesity class II.  Her calculated BMI is 36.6, will need outpatient follow-up.  4.  Depressed mood/anxiety.  Patient received alprazolam during her hospitalization.   Discharge Diagnoses:  Principal Problem:   Pneumonia  due to COVID-19 virus Active Problems:   Seasonal allergies   Depressed mood   Rosacea   Dyslipidemia   Pulmonary embolism (HCC)   Acute respiratory failure with hypoxia (HCC)   Class 2 obesity    Discharge Instructions  Discharge Instructions    Ambulatory referral to Pulmonology   Complete by: As directed    Post COVID viral pneumonia   Reason for referral: Other   Diet - low sodium heart healthy   Complete by: As directed    Discharge instructions   Complete by: As directed    Please follow with primary care in 10 days, continue self quarantine, use a mask in public and maintain distance for 10 more days.   Increase activity slowly   Complete by: As directed      Allergies as of 09/05/2020      Reactions   Cefotan [cefotetan] Rash   Developed rash during IV infusion- dose completed -benadryl given, no other issues occured      Medication List    STOP taking these medications   amoxicillin 500 MG tablet Commonly known as: AMOXIL     TAKE these medications   acetaminophen 325 MG tablet Commonly known as: TYLENOL Take 2 tablets (650 mg total) by mouth every 6 (six) hours as needed for mild pain (or Fever >/= 101).   AeroChamber Plus inhaler Use with inhaler   albuterol 108 (90 Base) MCG/ACT inhaler Commonly known as: VENTOLIN HFA Inhale 1 puff into the lungs every 4 (four) hours as needed for wheezing or shortness of breath. What changed:   how much to take  additional instructions   apixaban 5 MG Tabs tablet Commonly known as: ELIQUIS Take 2 tablets twice daily for 7 days, then continue 1 tablet twice daily   cetirizine 10 MG tablet Commonly known as: ZYRTEC Take 10 mg by mouth daily.   guaiFENesin-dextromethorphan 100-10 MG/5ML syrup Commonly known as: ROBITUSSIN DM Take 5 mLs by mouth every 6 (six) hours as needed for cough.   predniSONE 10 MG tablet Commonly known as: DELTASONE Take 4 tablets daily for five days, then 3 tablets daily for five  days, then 2 tablets daily for five days, then 1 tablet daily for five days, then half tablet daily for 5 days, then stop.   selenium 50 MCG Tabs tablet Take 50 mcg by mouth 2 (two) times daily.   vitamin C 1000 MG tablet Take 1,000 mg by mouth daily.   Vitamin D3 50 MCG (2000 UT) Tabs Take 2,000 Units by mouth daily.   zinc gluconate 50 MG tablet Take 30 mg by mouth daily.            Durable Medical Equipment  (From admission, onward)         Start     Ordered   09/05/20 0947  For home use only DME oxygen  Once       Question Answer Comment  Length of Need 6 Months   Mode or (Route) Nasal cannula   Liters per Minute 2   Frequency Continuous (stationary and portable oxygen unit needed)   Oxygen conserving device Yes   Oxygen delivery system Gas      09/05/20 0946          Follow-up Information    Caren Macadam, MD Follow up in 2 week(s).   Specialty: Family Medicine Contact information: [address] [place] [address] [phone]  Allergies  Allergen Reactions  . Cefotan [Cefotetan] Rash    Developed rash during IV infusion- dose completed -benadryl given, no other issues occured        Procedures/Studies: DG Chest 2 View  Result Date: 09/02/2020 CLINICAL DATA:  Dyspnea for 6 days EXAM: CHEST - 2 VIEW COMPARISON:  None. FINDINGS: Normal heart size. Normal mediastinal contour. No pneumothorax. No pleural effusion. Prominent patchy opacities throughout the peripheral lungs bilaterally, left greater than right. IMPRESSION: Prominent patchy opacities throughout the peripheral lungs bilaterally, left greater than right, suspicious for atypical/viral pneumonia such as due to COVID-19. Electronically Signed   By: Ilona Sorrel M.D.   On: 09/02/2020 09:39   CT Angio Chest PE W/Cm &/Or Wo Cm  Result Date: 09/02/2020 CLINICAL DATA:  50 year old female with hypoxia and elevated D-dimer. EXAM: CT ANGIOGRAPHY CHEST WITH CONTRAST  TECHNIQUE: Multidetector CT imaging of the chest was performed using the standard protocol during bolus administration of intravenous contrast. Multiplanar CT image reconstructions and MIPs were obtained to evaluate the vascular anatomy. CONTRAST:  15m OMNIPAQUE IOHEXOL 350 MG/ML SOLN COMPARISON:  09/02/2020 radiographs FINDINGS: Cardiovascular: This is a technically adequate study but respiratory motion artifact decreases sensitivity. Segmental pulmonary emboli within the RIGHT upper, middle and lower lobes noted. No other definite pulmonary emboli are noted. UPPER limits normal heart size noted. There is no evidence of thoracic aortic aneurysm or pericardial effusion. Mediastinum/Nodes: UPPER limits of normal mediastinal and hilar lymph nodes are likely reactive. No mediastinal mass or collection identified. No thyroid, trachea or esophagus abnormalities noted. Lungs/Pleura: Scattered ground-glass and airspace opacities within both lungs noted, greatest in the LOWER lungs and likely representing pneumonia/infection. No discrete mass, pleural effusion or pneumothorax noted. Upper Abdomen: No acute abnormality. Musculoskeletal: No acute or suspicious bony abnormalities noted. Review of the MIP images confirms the above findings. IMPRESSION: 1. Segmental pulmonary emboli within the RIGHT upper, middle and lower lobes. 2. Scattered ground-glass and airspace opacities within both lungs, greatest in the LOWER lungs, likely pneumonia/infection. Critical Value/emergent results were called by telephone at the time of interpretation on 09/02/2020 at 4:05 PM to provider Dr. ACathlean Sauer who verbally acknowledged these results. Electronically Signed   By: JMargarette CanadaM.D.   On: 09/02/2020 16:04   VAS [place]OWER EXTREMITY VENOUS (DVT)  Result Date: 09/03/2020  Lower Venous DVT Study Indications: Pulmonary embolism.  Risk Factors: COVID 19 positive. Limitations: Body habitus and poor ultrasound/tissue interface. Comparison Study:  No prior studies. Performing Technologist: GOliver HumRVT  Examination Guidelines: A complete evaluation includes B-mode imaging, spectral Doppler, color Doppler, and power Doppler as needed of all accessible portions of each vessel. Bilateral testing is considered an integral part of a complete examination. Limited examinations for reoccurring indications may be performed as noted. The reflux portion of the exam is performed with the patient in reverse Trendelenburg.  +---------+---------------+---------+-----------+----------+--------------+ RIGHT    CompressibilityPhasicitySpontaneityPropertiesThrombus Aging +---------+---------------+---------+-----------+----------+--------------+ CFV      Full           Yes      Yes                                 +---------+---------------+---------+-----------+----------+--------------+ SFJ      Full                                                        +---------+---------------+---------+-----------+----------+--------------+  FV Prox  Full                                                        +---------+---------------+---------+-----------+----------+--------------+ FV Mid   Full                                                        +---------+---------------+---------+-----------+----------+--------------+ FV DistalFull                                                        +---------+---------------+---------+-----------+----------+--------------+ PFV      Full                                                        +---------+---------------+---------+-----------+----------+--------------+ POP      Full           Yes      Yes                                 +---------+---------------+---------+-----------+----------+--------------+ PTV      Full                                                        +---------+---------------+---------+-----------+----------+--------------+ PERO     Full                                                         +---------+---------------+---------+-----------+----------+--------------+   +---------+---------------+---------+-----------+----------+--------------+ LEFT     CompressibilityPhasicitySpontaneityPropertiesThrombus Aging +---------+---------------+---------+-----------+----------+--------------+ CFV      Full           Yes      Yes                                 +---------+---------------+---------+-----------+----------+--------------+ SFJ      Full                                                        +---------+---------------+---------+-----------+----------+--------------+ FV Prox  Full                                                        +---------+---------------+---------+-----------+----------+--------------+  FV Mid   Full                                                        +---------+---------------+---------+-----------+----------+--------------+ FV DistalFull                                                        +---------+---------------+---------+-----------+----------+--------------+ PFV      Full                                                        +---------+---------------+---------+-----------+----------+--------------+ POP      Full           Yes      Yes                                 +---------+---------------+---------+-----------+----------+--------------+ PTV      Partial                                      Acute          +---------+---------------+---------+-----------+----------+--------------+ PERO     None                                         Acute          +---------+---------------+---------+-----------+----------+--------------+     Summary: RIGHT: - There is no evidence of deep vein thrombosis in the lower extremity.  - No cystic structure found in the popliteal fossa.  LEFT: - Findings consistent with acute deep vein thrombosis involving the left  posterior tibial veins, and left peroneal veins. - No cystic structure found in the popliteal fossa.  *See table(s) above for measurements and observations. Electronically signed by Servando Snare MD on 09/03/2020 at 1:58:39 PM.    Final    ECHOCARDIOGRAM LIMITED  Result Date: 09/03/2020    ECHOCARDIOGRAM LIMITED REPORT   Patient Name:   SIGNORA ZUCCO Date of Exam: 09/03/2020 Medical Rec #:  [id number]      Height:       65.0 in Accession #:    [id number]     Weight:       220.0 lb Date of Birth:  September 18, 1970     BSA:          2.060 m Patient Age:    21 years       BP:           149/93 mmHg Patient Gender: F              HR:           92 bpm. Exam Location:  Inpatient Procedure: Limited Echo, Cardiac Doppler, Color Doppler and Intracardiac            Opacification  Agent Indications:    Dyspnea R06.00  History:        Patient has no prior history of Echocardiogram examinations.  Sonographer:    Vickie Epley RDCS Referring Phys: [id number]  Sonographer Comments: Covid positive. IMPRESSIONS  1. Left ventricular ejection fraction, by estimation, is 60 to 65%. The left ventricle has normal function. The left ventricle has no regional wall motion abnormalities. Left ventricular diastolic parameters are consistent with Grade I diastolic dysfunction (impaired relaxation).  2. Right ventricular systolic function is normal. The right ventricular size is normal. Tricuspid regurgitation signal is inadequate for assessing PA pressure.  3. The mitral valve is normal in structure. No evidence of mitral valve regurgitation. No evidence of mitral stenosis.  4. The aortic valve is tricuspid. Aortic valve regurgitation is not visualized. No aortic stenosis is present.  5. The inferior vena cava is normal in size with greater than 50% respiratory variability, suggesting right atrial pressure of 3 mmHg. FINDINGS  Left Ventricle: Left ventricular ejection fraction, by estimation, is 60 to 65%. The left ventricle has  normal function. The left ventricle has no regional wall motion abnormalities. Definity contrast agent was given IV to delineate the left ventricular  endocardial borders. The left ventricular internal cavity size was normal in size. There is no left ventricular hypertrophy. Left ventricular diastolic parameters are consistent with Grade I diastolic dysfunction (impaired relaxation). Right Ventricle: The right ventricular size is normal. Right ventricular systolic function is normal. Tricuspid regurgitation signal is inadequate for assessing PA pressure. Left Atrium: Left atrial size was normal in size. Right Atrium: Right atrial size was normal in size. Pericardium: There is no evidence of pericardial effusion. Mitral Valve: The mitral valve is normal in structure. No evidence of mitral valve stenosis. Tricuspid Valve: The tricuspid valve is normal in structure. Tricuspid valve regurgitation is not demonstrated. Aortic Valve: The aortic valve is tricuspid. Aortic valve regurgitation is not visualized. No aortic stenosis is present. Aorta: The aortic root is normal in size and structure. Venous: The inferior vena cava is normal in size with greater than 50% respiratory variability, suggesting right atrial pressure of 3 mmHg. IAS/Shunts: No atrial level shunt detected by color flow Doppler. LEFT VENTRICLE PLAX 2D LVIDd:         5.30 cm  Diastology LVIDs:         3.40 cm  LV e' medial:    6.74 cm/s LV PW:         0.80 cm  LV E/e' medial:  9.5 LV IVS:        0.80 cm  LV e' lateral:   10.10 cm/s LVOT diam:     2.10 cm  LV E/e' lateral: 6.4 LV SV:         79 LV SV Index:   39 LVOT Area:     3.46 cm  RIGHT VENTRICLE RV S prime:     19.90 cm/s TAPSE (M-mode): 2.1 cm LEFT ATRIUM         Index LA diam:    3.10 cm 1.50 cm/m  AORTIC VALVE LVOT Vmax:   99.90 cm/s LVOT Vmean:  73.500 cm/s LVOT VTI:    0.229 m  AORTA Ao Root diam: 3.40 cm MITRAL VALVE MV Area (PHT): 4.15 cm    SHUNTS MV Decel Time: 183 msec    Systemic VTI:   0.23 m MV E velocity: 64.30 cm/s  Systemic Diam: 2.10 cm MV A velocity: 81.00 cm/s MV E/A ratio:  0.79 Loralie Champagne MD Electronically signed by Loralie Champagne MD Signature Date/Time: 09/03/2020/4:42:25 PM    Final         Subjective: Patient is feeling better, dyspnea continue to improve, no nausea or vomiting, no chest pain.   Discharge Exam: Vitals:   09/05/20 0815 09/05/20 0816  BP:    Pulse:    Resp:    Temp:    SpO2: (!) 84% 93%   Vitals:   09/05/20 0412 09/05/20 0519 09/05/20 0815 09/05/20 0816  BP: (!) 142/102 (!) 147/87    Pulse: (!) 58 (!) 56    Resp: 18     Temp: 98.2 F (36.8 C)     TempSrc:      SpO2: 99%  (!) 84% 93%  Weight:      Height:        General: Not in pain or dyspnea.  Neurology: Awake and alert, non focal  E ENT: mild pallor, no icterus, oral mucosa moist Cardiovascular: No JVD. S1-S2 present, rhythmic, no gallops, rubs, or murmurs. No lower extremity edema. Pulmonary: positive breath sounds bilaterally,no wheezing. Gastrointestinal. Abdomen soft and non tender Skin. No rashes Musculoskeletal: no joint deformities   The results of significant diagnostics from this hospitalization (including imaging, microbiology, ancillary and laboratory) are listed below for reference.     Microbiology: Recent Results (from the past 240 hour(s))  SARS CORONAVIRUS 2 (TAT 6-24 HRS) Nasopharyngeal Nasopharyngeal Swab     Status: Abnormal   Collection Time: 09/02/20  9:25 AM   Specimen: Nasopharyngeal Swab  Result Value Ref Range Status   SARS Coronavirus 2 POSITIVE (A) NEGATIVE Final    Comment: (NOTE) SARS-CoV-2 target nucleic acids are DETECTED.  The SARS-CoV-2 RNA is generally detectable in upper and lower respiratory specimens during the acute phase of infection. Positive results are indicative of the presence of SARS-CoV-2 RNA. Clinical correlation with patient history and other diagnostic information is  necessary to determine patient infection  status. Positive results do not rule out bacterial infection or co-infection with other viruses.  The expected result is Negative.  Fact Sheet for Patients: SugarRoll.be  Fact Sheet for Healthcare Providers: https://www.woods-mathews.com/  This test is not yet approved or cleared by the [place] FDA and  has been authorized for detection and/or diagnosis of SARS-CoV-2 by FDA under an Emergency Use Authorization (EUA). This EUA will remain  in effect (meaning this test can be used) for the duration of the COVID-19 declaration under Section 564(b)(1) of the Act, 21 U. S.C. section 360bbb-3(b)(1), unless the authorization is terminated or revoked sooner.   Performed at [facility], [place] [address] [place], [place] [address]   SARS Coronavirus 2 by RT PCR (hospital order, performed in [facility] hospital lab) Nasopharyngeal Nasopharyngeal Swab     Status: Abnormal   Collection Time: 09/02/20  2:16 PM   Specimen: Nasopharyngeal Swab  Result Value Ref Range Status   SARS Coronavirus 2 POSITIVE (A) NEGATIVE Final    Comment: RESULT CALLED TO, READ BACK BY AND VERIFIED WITH: WEST,S. RN @1831  ON 01.26.2022 BY COHEN,K (NOTE) SARS-CoV-2 target nucleic acids are DETECTED  SARS-CoV-2 RNA is generally detectable in upper respiratory specimens  during the acute phase of infection.  Positive results are indicative  of the presence of the identified virus, but do not rule out bacterial infection or co-infection with other pathogens not detected by the test.  Clinical correlation with patient history and  other diagnostic information is necessary to determine patient  infection status.  The expected result is negative.  Fact Sheet for Patients:   StrictlyIdeas.no   Fact Sheet for Healthcare Providers:   BankingDealers.co.za    This test is not yet approved or cleared by the [place] FDA  and  has been authorized for detection and/or diagnosis of SARS-CoV-2 by FDA under an Emergency Use Authorization (EUA).  This EUA will remain in effect (meaning  this test can be used) for the duration of  the COVID-19 declaration under Section 564(b)(1) of the Act, 21 U.S.C. section 360-bbb-3(b)(1), unless the authorization is terminated or revoked sooner.  Performed at [facility], [place] [address] [place], [place] [address]   Blood Culture (routine x 2)     Status: None (Preliminary result)   Collection Time: 09/02/20  2:16 PM   Specimen: BLOOD  Result Value Ref Range Status   Specimen Description   Final    BLOOD SITE NOT SPECIFIED Performed at [facility] [address] [place], [place] [address]    Special Requests   Final    BOTTLES DRAWN AEROBIC AND ANAEROBIC Blood Culture adequate volume Performed at [facility] [address] [place], [place] [address]    Culture   Final    NO GROWTH 2 DAYS Performed at [facility] [address] [place], [place] [address]    Report Status PENDING  Incomplete  Blood Culture (routine x 2)     Status: None (Preliminary result)   Collection Time: 09/02/20  2:28 PM   Specimen: BLOOD  Result Value Ref Range Status   Specimen Description   Final    BLOOD SITE NOT SPECIFIED Performed at [facility] [address], [place], [place] [address]    Special Requests   Final    BOTTLES DRAWN AEROBIC AND ANAEROBIC Blood Culture adequate volume Performed at [facility] [address], [place], [place] [address]    Culture   Final    NO GROWTH 2 DAYS Performed at [facility] [address] [place], [place] [address]    Report Status PENDING  Incomplete     Labs: BNP (last 3 results) No results for input(s): BNP in the last 8760 hours. Basic Metabolic Panel: Recent Labs  Lab 09/02/20 1309  NA 136  K 4.0  CL 101  CO2 23  GLUCOSE 132*   BUN 10  CREATININE 0.85  CALCIUM 8.9   Liver Function Tests: Recent Labs  Lab 09/02/20 1309  AST 19  ALT 24  ALKPHOS 66  BILITOT 0.4  PROT 7.7  ALBUMIN 3.3*   No results for input(s): LIPASE, AMYLASE in the last 168 hours. No results for input(s): AMMONIA in the last 168 hours. CBC: Recent Labs  Lab 09/02/20 1309 09/03/20 0642  WBC 9.0 6.4  NEUTROABS 7.2  --   HGB 13.9 13.6  HCT 42.5 41.7  MCV 92.8 92.3  PLT 620* 598*   Cardiac Enzymes: No results for input(s): CKTOTAL, CKMB, CKMBINDEX, TROPONINI in the last 168 hours. BNP: Invalid input(s): POCBNP CBG: No results for input(s): GLUCAP in the last 168 hours. D-Dimer Recent Labs    09/04/20 0321 09/05/20 0241  DDIMER 5.47* 5.06*   Hgb A1c No results for input(s): HGBA1C in the last 72 hours. Lipid Profile Recent Labs    09/02/20 1416  TRIG 97   Thyroid function studies No results for input(s): TSH, T4TOTAL, T3FREE, THYROIDAB in the last 72  hours.  Invalid input(s): FREET3 Anemia work up Recent Labs    09/04/20 0321 09/05/20 0241  FERRITIN 72 58   Urinalysis    Component Value Date/Time   COLORURINE YELLOW 09/14/2016 0940   APPEARANCEUR CLEAR 09/14/2016 0940   LABSPEC 1.025 09/14/2016 0940   PHURINE 6.5 09/14/2016 0940   GLUCOSEU NEGATIVE 09/14/2016 0940   HGBUR NEGATIVE 09/14/2016 0940   BILIRUBINUR NEGATIVE 09/14/2016 0940   KETONESUR NEGATIVE 09/14/2016 0940   PROTEINUR NEGATIVE 09/14/2016 0940   UROBILINOGEN 0.2 07/23/2012 1107   NITRITE NEGATIVE 09/14/2016 0940   LEUKOCYTESUR NEGATIVE 09/14/2016 0940   Sepsis Labs Invalid input(s): PROCALCITONIN,  WBC,  LACTICIDVEN Microbiology Recent Results (from the past 240 hour(s))  SARS CORONAVIRUS 2 (TAT 6-24 HRS) Nasopharyngeal Nasopharyngeal Swab     Status: Abnormal   Collection Time: 09/02/20  9:25 AM   Specimen: Nasopharyngeal Swab  Result Value Ref Range Status   SARS Coronavirus 2 POSITIVE (A) NEGATIVE Final    Comment:  (NOTE) SARS-CoV-2 target nucleic acids are DETECTED.  The SARS-CoV-2 RNA is generally detectable in upper and lower respiratory specimens during the acute phase of infection. Positive results are indicative of the presence of SARS-CoV-2 RNA. Clinical correlation with patient history and other diagnostic information is  necessary to determine patient infection status. Positive results do not rule out bacterial infection or co-infection with other viruses.  The expected result is Negative.  Fact Sheet for Patients: SugarRoll.be  Fact Sheet for Healthcare Providers: https://www.woods-mathews.com/  This test is not yet approved or cleared by the [place] FDA and  has been authorized for detection and/or diagnosis of SARS-CoV-2 by FDA under an Emergency Use Authorization (EUA). This EUA will remain  in effect (meaning this test can be used) for the duration of the COVID-19 declaration under Section 564(b)(1) of the Act, 21 U. S.C. section 360bbb-3(b)(1), unless the authorization is terminated or revoked sooner.   Performed at [facility], [place] [address] [place], [place] [address]   SARS Coronavirus 2 by RT PCR (hospital order, performed in [facility] hospital lab) Nasopharyngeal Nasopharyngeal Swab     Status: Abnormal   Collection Time: 09/02/20  2:16 PM   Specimen: Nasopharyngeal Swab  Result Value Ref Range Status   SARS Coronavirus 2 POSITIVE (A) NEGATIVE Final    Comment: RESULT CALLED TO, READ BACK BY AND VERIFIED WITH: WEST,S. RN @1831  ON 01.26.2022 BY COHEN,K (NOTE) SARS-CoV-2 target nucleic acids are DETECTED  SARS-CoV-2 RNA is generally detectable in upper respiratory specimens  during the acute phase of infection.  Positive results are indicative  of the presence of the identified virus, but do not rule out bacterial infection or co-infection with other pathogens not detected by the test.  Clinical correlation with  patient history and  other diagnostic information is necessary to determine patient infection status.  The expected result is negative.  Fact Sheet for Patients:   StrictlyIdeas.no   Fact Sheet for Healthcare Providers:   BankingDealers.co.za    This test is not yet approved or cleared by the [place] FDA and  has been authorized for detection and/or diagnosis of SARS-CoV-2 by FDA under an Emergency Use Authorization (EUA).  This EUA will remain in effect (meaning  this test can be used) for the duration of  the COVID-19 declaration under Section 564(b)(1) of the Act, 21 U.S.C. section 360-bbb-3(b)(1), unless the authorization is terminated or revoked sooner.  Performed at [facility], [address] [place], [place]  [address]   Blood Culture (routine x 2)     Status: None (Preliminary result)   Collection Time: 09/02/20  2:16 PM   Specimen: BLOOD  Result Value Ref Range Status   Specimen Description   Final    BLOOD SITE NOT SPECIFIED Performed at [facility], [address], [place], [place] [address]    Special Requests   Final    BOTTLES DRAWN AEROBIC AND ANAEROBIC Blood Culture adequate volume Performed at [place] [address], [place], [place] [address]    Culture   Final    NO GROWTH 2 DAYS Performed at [facility] [address] [place], [place] [address]    Report Status PENDING  Incomplete  Blood Culture (routine x 2)     Status: None (Preliminary result)   Collection Time: 09/02/20  2:28 PM   Specimen: BLOOD  Result Value Ref Range Status   Specimen Description   Final    BLOOD SITE NOT SPECIFIED Performed at [place] [address] [place], [place] [address]    Special Requests   Final    BOTTLES DRAWN AEROBIC AND ANAEROBIC Blood Culture adequate volume Performed at [facility] [address] [place],  [place] [address]    Culture   Final    NO GROWTH 2 DAYS Performed at [facility] [address], [place], [place] [address]    Report Status PENDING  Incomplete     Time coordinating discharge: 45 minutes  SIGNED:   Tawni Millers, MD  [facility] 09/05/2020, 9:55 AM

## 2020-09-05 NOTE — Progress Notes (Signed)
PSATURATION QUALIFICATIONS: (This note is used to comply with regulatory documentation for home oxygen)  Patient Saturations on Room Air at Rest = 88*%  Patient Saturations on Room Air while Ambulating = 84*%  Patient Saturations on **2* Liters of oxygen while Ambulating = 90%  Please briefly explain why patient needs home oxygen:Pt unable to tolerate without oxygen, pt gets SOB and sats drop below 80 percent

## 2020-09-05 NOTE — Progress Notes (Signed)
Pt verbalizes understanding of all discharge instructions, all questions answered. Awaiting husband for ride home

## 2020-09-07 ENCOUNTER — Other Ambulatory Visit: Payer: Self-pay

## 2020-09-07 ENCOUNTER — Ambulatory Visit (HOSPITAL_COMMUNITY)
Admission: EM | Admit: 2020-09-07 | Discharge: 2020-09-07 | Disposition: A | Discharge status: Home / Self Care | Payer: 59 | Attending: Internal Medicine | Admitting: Internal Medicine

## 2020-09-07 ENCOUNTER — Encounter (HOSPITAL_COMMUNITY): Payer: Self-pay | Admitting: Emergency Medicine

## 2020-09-07 ENCOUNTER — Telehealth: Payer: Self-pay | Admitting: Family Medicine

## 2020-09-07 DIAGNOSIS — U071 COVID-19: Secondary | ICD-10-CM | POA: Diagnosis present

## 2020-09-07 DIAGNOSIS — N921 Excessive and frequent menstruation with irregular cycle: Secondary | ICD-10-CM | POA: Insufficient documentation

## 2020-09-07 LAB — CULTURE, BLOOD (ROUTINE X 2)
Culture: NO GROWTH
Culture: NO GROWTH
Special Requests: ADEQUATE
Special Requests: ADEQUATE

## 2020-09-07 LAB — CBC
HCT: 45.1 % (ref 36.0–46.0)
Hemoglobin: 15.2 g/dL — ABNORMAL HIGH (ref 12.0–15.0)
MCH: 30.4 pg (ref 26.0–34.0)
MCHC: 33.7 g/dL (ref 30.0–36.0)
MCV: 90.2 fL (ref 80.0–100.0)
Platelets: 586 10*3/uL — ABNORMAL HIGH (ref 150–400)
RBC: 5 MIL/uL (ref 3.87–5.11)
RDW: 14.2 % (ref 11.5–15.5)
WBC: 8.7 10*3/uL (ref 4.0–10.5)
nRBC: 0 % (ref 0.0–0.2)

## 2020-09-07 NOTE — ED Provider Notes (Signed)
[place]    CSN: [id number] Arrival date & time: 09/07/20  0806      History   Chief Complaint Chief Complaint  Patient presents with   Covid Positive   Vaginal Bleeding    HPI Sherry Marquez is a 50 y.o. female comes to the urgent care with complaints of vaginal bleeding of a few days duration.  Patient was recently discharged from the hospital for COVID-19 infection.  Patient had pulmonary embolism and was started on Eliquis during the hospital stay.  She has had heavy bleeding over the past few days.  No abdominal pain or cramping.  Patient has a history of menses and she is recently being having irregular menses.  No weight loss.  Patient is changing tampons every hour to 2 hours.  Flow is moderate.  Thyroid function in the past has been normal.  No cold intolerance. Past Medical History:  Diagnosis Date   Allergy    Eczema    Environmental and seasonal allergies    History of gestational diabetes    LUMBAR SPRAIN AND STRAIN 12/05/2009   Qualifier: Diagnosis of  By: Sarajane Jews MD, Annie Main A    Right ovarian cyst    Rosacea     Patient Active Problem List   Diagnosis Date Noted   Pneumonia due to COVID-19 virus 09/02/2020   Dyslipidemia 09/02/2020   Pulmonary embolism ([place]) 09/02/2020   Acute respiratory failure with hypoxia ([place]) 09/02/2020   Class 2 obesity 09/02/2020   Rosacea    Right ovarian cyst    Hemorrhagic cyst of right ovary 01/30/2019   Abdominal pain 01/29/2019   Urinary tract infection symptoms 01/26/2019   Foot pain - pes planus 10/29/2013   Seasonal allergies 10/29/2013   Depressed mood 10/29/2013   HYPERLIPIDEMIA 08/20/2009    Past Surgical History:  Procedure Laterality Date   CESAREAN SECTION  03-15-2005   dr Phineas Real  @[facility]    LAPAROSCOPY N/A 05/20/2019   Procedure: LAPAROSCOPY DIAGNOSTIC;  Surgeon: Anastasio Auerbach, MD;  Location: [facility];  Service: Gynecology;  Laterality: N/A;   WISDOM  TOOTH EXTRACTION  2001    OB History    Gravida  3   Para  2   Term      Preterm      AB  1   Living  2     SAB  1   IAB      Ectopic  0   Multiple      Live Births               Home Medications    Prior to Admission medications   Medication Sig Start Date End Date Taking? Authorizing Provider  acetaminophen (TYLENOL) 325 MG tablet Take 2 tablets (650 mg total) by mouth every 6 (six) hours as needed for mild pain (or Fever >/= 101). 09/05/20   Arrien, Jimmy Picket, MD  albuterol (VENTOLIN HFA) 108 (90 Base) MCG/ACT inhaler Inhale 1 puff into the lungs every 4 (four) hours as needed for wheezing or shortness of breath. 09/05/20   Arrien, Jimmy Picket, MD  apixaban (ELIQUIS) 5 MG TABS tablet Take 2 tablets twice daily for 7 days, then continue 1 tablet twice daily 09/05/20   Arrien, Jimmy Picket, MD  Ascorbic Acid (VITAMIN C) 1000 MG tablet Take 1,000 mg by mouth daily.    [provider]  cetirizine (ZYRTEC) 10 MG tablet Take 10 mg by mouth daily.    [provider]  Cholecalciferol (VITAMIN D3) 50 MCG (2000 UT) TABS Take 2,000 Units by mouth daily.    [provider]  guaiFENesin-dextromethorphan (ROBITUSSIN DM) 100-10 MG/5ML syrup Take 5 mLs by mouth every 6 (six) hours as needed for cough. 09/05/20   Arrien, Jimmy Picket, MD  predniSONE (DELTASONE) 10 MG tablet Take 4 tablets daily for five days, then 3 tablets daily for five days, then 2 tablets daily for five days, then 1 tablet daily for five days, then half tablet daily for 5 days, then stop. 09/05/20   Arrien, Jimmy Picket, MD  selenium 50 MCG TABS tablet Take 50 mcg by mouth 2 (two) times daily.    [provider]  Spacer/Aero-Holding Chambers (AEROCHAMBER PLUS) inhaler Use with inhaler 09/02/20   Melynda Ripple, MD  zinc gluconate 50 MG tablet Take 30 mg by mouth daily.    [provider]    Family History Family History  Problem Relation Age of  Onset   Heart disease Father        defibrillator   Hypertension Father    Heart attack Father 76   CAD Father        4-way bypass   Hypertension Brother    Atrial fibrillation Mother    Hypothyroidism Mother    Lymphoma Maternal Grandmother 32   Congestive Heart Failure Maternal Grandmother    Stroke Maternal Grandfather 25   Cancer Paternal Grandmother        mets to lung   Heart attack Paternal Grandfather        early death    Social History Social History   Tobacco Use   Smoking status: Never Smoker   Smokeless tobacco: Never Used  Scientific laboratory technician Use: Never used  Substance Use Topics   Alcohol use: Yes    Comment: rare   Drug use: Not Currently     Allergies   Cefotan [cefotetan]   Review of Systems Review of Systems  Respiratory: Positive for cough and shortness of breath. Negative for wheezing.   Cardiovascular: Negative for chest pain and palpitations.  Gastrointestinal: Negative for abdominal pain, nausea and vomiting.  Genitourinary: Positive for vaginal bleeding. Negative for vaginal discharge and vaginal pain.  Musculoskeletal: Negative for back pain, joint swelling and myalgias.  Neurological: Negative for dizziness and light-headedness.     Physical Exam Triage Vital Signs ED Triage Vitals  Enc Vitals Group     BP 09/07/20 0831 112/75     Pulse Rate 09/07/20 0831 96     Resp 09/07/20 0831 19     Temp 09/07/20 0831 98.9 F (37.2 C)     Temp Source 09/07/20 0831 Oral     SpO2 09/07/20 0831 94 %     Weight --      Height --      Head Circumference --      Peak Flow --      Pain Score 09/07/20 0828 0     Pain Loc --      Pain Edu? --      Excl. in Force? --    No data found.  Updated Vital Signs BP 112/75 (BP Location: Left Arm)    Pulse 96    Temp 98.9 F (37.2 C) (Oral)    Resp 19    LMP 08/10/2020    SpO2 94%   Visual Acuity Right Eye Distance:   Left Eye Distance:   Bilateral Distance:    Right Eye Near:  Left Eye Near:    Bilateral Near:     Physical Exam Vitals and nursing note reviewed.  Constitutional:      General: She is not in acute distress.    Appearance: She is ill-appearing.  Abdominal:     General: Bowel sounds are normal.     Palpations: Abdomen is soft.  Neurological:     General: No focal deficit present.     Mental Status: She is alert and oriented to person, place, and time.      UC Treatments / Results  Labs (all labs ordered are listed, but only abnormal results are displayed) Labs Reviewed  CBC - Abnormal; Notable for the following components:      Result Value   Hemoglobin 15.2 (*)    Platelets 586 (*)    All other components within normal limits    EKG   Radiology No results found.  Procedures Procedures (including critical care time)  Medications Ordered in UC Medications - No data to display  Initial Impression / Assessment and Plan / UC Course  I have reviewed the triage vital signs and the nursing notes.  Pertinent labs & imaging results that were available during my care of the patient were reviewed by me and considered in my medical decision making (see chart for details).     1.  Menorrhagia with irregular cycle aggravated by anticoagulation: Patient is advised to continue using Eliquis The risk from pulmonary embolism outweighs the risk of significant bleeding If patient's continues to experience bleeding she is advised to return to the urgent care to be evaluated further. Hemoglobin was 13.6.  Patient was hemodynamically stable without tachycardia or symptoms of symptomatic anemia hence no further work-up needed Final Clinical Impressions(s) / UC Diagnoses   Final diagnoses:  Menorrhagia with irregular cycle  COVID     Discharge Instructions     Continue to monitor menstrual flow If symptoms worsen please return to urgent care to be reevaluated You may need to see a gynecologist for further work-up of irregular menses if  this becomes persistent.      ED Prescriptions    None     PDMP not reviewed this encounter.   Chase Picket, MD 09/10/20 1153

## 2020-09-07 NOTE — Discharge Instructions (Signed)
Continue to monitor menstrual flow If symptoms worsen please return to urgent care to be reevaluated You may need to see a gynecologist for further work-up of irregular menses if this becomes persistent.

## 2020-09-07 NOTE — ED Triage Notes (Signed)
Pt presents with vaginal bleeding. States was recently hospitalized for COVID and placed on blood thinners. Was advised by PCP to come to UC.

## 2020-09-07 NOTE — Telephone Encounter (Signed)
Transition Care Management Follow-up Telephone Call  Date of discharge and from where: 09/05/2020 from [facility]   How have you been since you were released from the hospital? Patient states that she is doing ok. Has some SOB still. Currently on 3L   Any questions or concerns? Yes, had some concerns of bleeding   Items Reviewed:  Did the pt receive and understand the discharge instructions provided? Yes   Medications obtained and verified? Yes   Other? No   Any new allergies since your discharge? Yes   Dietary orders reviewed? Yes  Do you have support at home? Yes   Home Care and Equipment/Supplies: Were home health services ordered? no If so, what is the name of the agency? N/A   Has the agency set up a time to come to the patient's home? no Were any new equipment or medical supplies ordered?  Yes: Oxygen  What is the name of the medical supply agency? [facility]  Were you able to get the supplies/equipment? yes Do you have any questions related to the use of the equipment or supplies? No  Functional Questionnaire: (I = Independent and D = Dependent) ADLs: I  Bathing/Dressing- I  Meal Prep- I  Eating- I  Maintaining continence- I  Transferring/Ambulation- I  Managing Meds- I  Follow up appointments reviewed:   PCP Hospital f/u appt confirmed? Yes  Scheduled to see Dr. Ethlyn Gallery  on 09/30/2020 @ 8:30 am .  [facility] f/u appt confirmed? No      Are transportation arrangements needed? No   If their condition worsens, is the pt aware to call PCP or go to the Emergency Dept.? Yes  Was the patient provided with contact information for the PCP's office or ED? Yes  Was to pt encouraged to call back with questions or concerns? Yes

## 2020-09-17 ENCOUNTER — Other Ambulatory Visit: Payer: Self-pay | Admitting: Family Medicine

## 2020-09-17 ENCOUNTER — Telehealth: Payer: Self-pay | Admitting: Family Medicine

## 2020-09-17 MED ORDER — APIXABAN 5 MG PO TABS: 5.0000 mg | ORAL_TABLET | Freq: Two times a day (BID) | ORAL | 5 refills | Status: DC

## 2020-09-17 NOTE — Telephone Encounter (Signed)
Pt is calling in needing a refill on Rx apixaban (ELIQUIS) 5 MG  Pharm:  Kristopher Oppenheim at [facility]

## 2020-09-17 NOTE — Telephone Encounter (Signed)
completed

## 2020-09-29 ENCOUNTER — Other Ambulatory Visit: Payer: Self-pay

## 2020-09-30 ENCOUNTER — Ambulatory Visit: Payer: 59 | Admitting: Family Medicine

## 2020-09-30 ENCOUNTER — Encounter: Payer: Self-pay | Admitting: Family Medicine

## 2020-09-30 VITALS — BP 112/82 | HR 84 | Temp 98.3°F | Ht 65.0 in | Wt 212.6 lb

## 2020-09-30 DIAGNOSIS — I82462 Acute embolism and thrombosis of left calf muscular vein: Secondary | ICD-10-CM | POA: Diagnosis not present

## 2020-09-30 DIAGNOSIS — I2699 Other pulmonary embolism without acute cor pulmonale: Secondary | ICD-10-CM

## 2020-09-30 NOTE — Patient Instructions (Signed)
Update me in 4 months time; about a month prior to your next visit. We can decide what follow up we need at that time.

## 2020-09-30 NOTE — Progress Notes (Signed)
Sherry Marquez DOB: 05/07/1971 Encounter date: 09/30/2020  This is a 50 y.o. female who presents with Chief Complaint  Patient presents with  . Hospitalization Follow-up    History of present illness:  Admitted 09/02/2020 and discharged on 09/05/2020 acute hypoxic respiratory failure secondary to Covid with complication of pulmonary embolism and left lower extremity DVT.  She was discharged home with supplemental oxygen.  She was started on Eliquis. Hasn't had another cycle since that time. Period took just over a week to go away. Tends to have heavier periods in last couple of years; sometimes skips periods.   Seen in urgent care 1/31 for menorrhagia secondary to Eliquis.  Feeling a lot better. She goes most of day without oxygen; just needing it if she is overdoing things around the house. When first came home she was on 3L; now usually on 1.5 when needing, but without oxygen most of time staying 95%. Walking up stairs at half speed and maintaining 95%. She is seeing pulmonary on Monday for follow up. Can tell she is not completely back to normal with large breath. Not coughing. No head congestion. No fevers in last few weeks.   Energy level is much better, but she does note she wears out easier. Does get nasal congestion/clogging left side of nose which sometimes affects her sleep; then she will just need nap next day.   Appetite is normal now. She is working on healthy eating and continued weight loss.   Mood has been good overall. Feels like this sort of shook her back into feeling better. Husband had lost both of parents in September to covid, so this was scary situation.   Allergies  Allergen Reactions  . Cefotan [Cefotetan] Rash    Developed rash during IV infusion- dose completed -benadryl given, no other issues occured   Current Meds  Medication Sig  . albuterol (VENTOLIN HFA) 108 (90 Base) MCG/ACT inhaler Inhale 1 puff into the lungs every 4 (four) hours as needed for  wheezing or shortness of breath.  Marland Kitchen apixaban (ELIQUIS) 5 MG TABS tablet Take 1 tablet (5 mg total) by mouth 2 (two) times daily.  . Ascorbic Acid (VITAMIN C) 1000 MG tablet Take 1,000 mg by mouth daily.  . cetirizine (ZYRTEC) 10 MG tablet Take 10 mg by mouth daily.  . Cholecalciferol (VITAMIN D3) 50 MCG (2000 UT) TABS Take 2,000 Units by mouth daily.  . Ipratropium-Albuterol (COMBIVENT RESPIMAT) 20-100 MCG/ACT AERS respimat Inhale 1 puff into the lungs daily.  . predniSONE (DELTASONE) 10 MG tablet Take 4 tablets daily for five days, then 3 tablets daily for five days, then 2 tablets daily for five days, then 1 tablet daily for five days, then half tablet daily for 5 days, then stop.    Review of Systems  Constitutional: Negative for chills, fatigue and fever.  Respiratory: Positive for shortness of breath (just with activity). Negative for cough, chest tightness and wheezing.   Cardiovascular: Negative for chest pain, palpitations and leg swelling.    Objective:  BP 138/90 (BP Location: Left Arm, Patient Position: Sitting, Cuff Size: Normal)   Pulse 84   Temp 98.3 F (36.8 C) (Oral)   Ht 5' 5"  (1.651 m)   Wt 212 lb 9.6 oz (96.4 kg)   SpO2 99%   BMI 35.38 kg/m   Weight: 212 lb 9.6 oz (96.4 kg)   BP Readings from Last 3 Encounters:  09/30/20 138/90  09/07/20 112/75  09/05/20 (!) 147/87   Wt Readings from  Last 3 Encounters:  09/30/20 212 lb 9.6 oz (96.4 kg)  09/02/20 220 lb (99.8 kg)  10/22/19 218 lb (98.9 kg)    Physical Exam Constitutional:      General: She is not in acute distress.    Appearance: She is well-developed.  Cardiovascular:     Rate and Rhythm: Normal rate and regular rhythm.     Heart sounds: Normal heart sounds. No murmur heard. No friction rub.  Pulmonary:     Effort: Pulmonary effort is normal. No respiratory distress.     Breath sounds: Normal breath sounds. No wheezing or rales.  Musculoskeletal:     Right lower leg: No edema.     Left lower leg:  No edema.  Neurological:     Mental Status: She is alert and oriented to person, place, and time.  Psychiatric:        Behavior: Behavior normal.     Assessment/Plan  1. Acute pulmonary embolism, unspecified pulmonary embolism type, unspecified whether acute cor pulmonale present [facility]) She is doing well overall. Improved breathing, exercise tolerance, decreased oxygen need. She will follow with pulmonology; suggested she ask about more portable oxygen if needing oxygen long term to allow her freedom for grocery store, etc. I have asked her to follow up in 5 months; contact me a month before for bloodwork orders/consideration of imaging orders.  Continue on eliquis 53m BID. If prolonged period let uKore[place]now.   2. Acute deep vein thrombosis (DVT) of calf muscle vein of left lower extremity (HCC) Leg pain has resolved. We reviewed reasons to seek care including swelling, pain, color change. We discussed consideration for compression stockings in future.     Return in about 5 months (around 02/27/2021).     JMicheline Rough MD

## 2020-10-05 ENCOUNTER — Encounter: Payer: Self-pay | Admitting: Pulmonary Disease

## 2020-10-05 ENCOUNTER — Ambulatory Visit (INDEPENDENT_AMBULATORY_CARE_PROVIDER_SITE_OTHER): Payer: 59

## 2020-10-05 ENCOUNTER — Other Ambulatory Visit: Payer: Self-pay

## 2020-10-05 ENCOUNTER — Ambulatory Visit: Payer: 59 | Admitting: Pulmonary Disease

## 2020-10-05 VITALS — BP 140/90 | HR 94 | Temp 97.9°F | Ht 65.0 in | Wt 213.0 lb

## 2020-10-05 DIAGNOSIS — U071 COVID-19: Secondary | ICD-10-CM

## 2020-10-05 DIAGNOSIS — J9601 Acute respiratory failure with hypoxia: Secondary | ICD-10-CM | POA: Diagnosis not present

## 2020-10-05 DIAGNOSIS — I824Y2 Acute embolism and thrombosis of unspecified deep veins of left proximal lower extremity: Secondary | ICD-10-CM | POA: Insufficient documentation

## 2020-10-05 DIAGNOSIS — J1282 Pneumonia due to coronavirus disease 2019: Secondary | ICD-10-CM

## 2020-10-05 DIAGNOSIS — I2699 Other pulmonary embolism without acute cor pulmonale: Secondary | ICD-10-CM

## 2020-10-05 NOTE — Patient Instructions (Signed)
Acute hypoxemic respiratory failure - improving --Recommend continuing 1L oxygen nightly and only as needed during the day to maintain goal SpO2 >88% --Encourage regular activity including 3-5 times a week of 20 minute sessions  Acute DVT/PE  --Continue Eliquis for 3 month course (end April 20)  Nasal Congestion --Continue saline sprays --Recommend Flonase 1 spray per nare nightly  Follow-up in 2 months with me

## 2020-10-05 NOTE — Progress Notes (Signed)
Subjective:   PATIENT ID: Sherry Marquez GENDER: female DOB: 10/02/1970, MRN: [id number]   HPI  Chief Complaint  Patient presents with  . Consult    Patient was admitted to the hospital on 09/02/20 for respiratory failure and pneumonia.  Had Covid early Jan. Patient has only been wearing 1 liter oxygen as needed with activity. Was wearing it at night when sleeping has not the last couple nights states that her sats have been around 95% with activity, when at rest she is higher. Did have blood clots in leg    Reason for Visit: New consult for covid pneumonia follow-up  [name] is a 50 year old female with allergic rhinitis who presents as a new consult for hospital follow-up.  EMR notes reviewed. She was recently hospitalized from 09/02/20 to 09/05/20 for acute hypoxemic respiratory failure secondary to covid complicated by PE/DVT. She required home O2 and steroid taper on discharge. Post discharge she presented to ED on 09/07/20 for menorrhagia secondary to Eliquis. Hemoglobin was stable and hemodynamically stable so she was advised to continue anticoagulation.  She has completed steroids. She wears 1L oxygen with activity. Has not been sleeping with it. She reports that she has shortness of breath with activity especially when walking upstairs. Aggravated by stress, brisk walking, walking upstairs. Improves with resting for a minute. Denies wheezing or coughing. Reports nasal congestion. Denies chest congestion. Denies fevers, chills. She is tolerating Eliquis and no further episodes of bleeding. When she had covid she was immobile and now she ambulates around the house, though not up to her normal activity level.   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  Past Medical History:  Diagnosis Date  . Allergy   . Eczema   . Environmental and seasonal allergies   . History of  gestational diabetes   . LUMBAR SPRAIN AND STRAIN 12/05/2009   Qualifier: Diagnosis of  By: Sarajane Jews MD, Ishmael Holter   . Right ovarian cyst   . Rosacea      Family History  Problem Relation Age of Onset  . Heart disease Father        defibrillator  . Hypertension Father   . Heart attack Father 66  . CAD Father        4-way bypass  . Hypertension Brother   . Atrial fibrillation Mother   . Hypothyroidism Mother   . Lymphoma Maternal Grandmother 80  . Congestive Heart Failure Maternal Grandmother   . Stroke Maternal Grandfather 80  . Cancer Paternal Grandmother        mets to lung  . Heart attack Paternal Grandfather        early death     Social History   Occupational History  . Not on file  Tobacco Use  . Smoking status: Never Smoker  . Smokeless tobacco: Never Used  Vaping Use  . Vaping Use: Never used  Substance and Sexual Activity  . Alcohol use: Yes    Comment: rare  . Drug use: Not Currently  . Sexual activity: Yes    Birth control/protection: Other-see comments    Comment: SPOUSE VASECTOMY    Allergies  Allergen Reactions  . Cefotan [Cefotetan] Rash    Developed rash during IV infusion- dose completed -benadryl given, no other issues occured     Outpatient Medications Prior to Visit  Medication Sig Dispense Refill  . albuterol (VENTOLIN HFA) 108 (90 Base) MCG/ACT  inhaler Inhale 1 puff into the lungs every 4 (four) hours as needed for wheezing or shortness of breath. 18 g 0  . apixaban (ELIQUIS) 5 MG TABS tablet Take 1 tablet (5 mg total) by mouth 2 (two) times daily. 60 tablet 5  . Ascorbic Acid (VITAMIN C) 1000 MG tablet Take 1,000 mg by mouth daily.    . cetirizine (ZYRTEC) 10 MG tablet Take 10 mg by mouth daily.    . Cholecalciferol (VITAMIN D3) 50 MCG (2000 UT) TABS Take 2,000 Units by mouth daily.    . Ipratropium-Albuterol (COMBIVENT RESPIMAT) 20-100 MCG/ACT AERS respimat Inhale 1 puff into the lungs daily.    . predniSONE (DELTASONE) 10 MG tablet Take 4  tablets daily for five days, then 3 tablets daily for five days, then 2 tablets daily for five days, then 1 tablet daily for five days, then half tablet daily for 5 days, then stop. 53 tablet 0   No facility-administered medications prior to visit.    Review of Systems  Constitutional: Negative for chills, diaphoresis, fever, malaise/fatigue and weight loss.  HENT: Positive for congestion. Negative for ear pain and sore throat.   Respiratory: Positive for shortness of breath. Negative for cough, hemoptysis, sputum production and wheezing.   Cardiovascular: Negative for chest pain, palpitations and leg swelling.  Gastrointestinal: Negative for abdominal pain, heartburn and nausea.  Genitourinary: Negative for frequency.  Musculoskeletal: Negative for joint pain and myalgias.  Skin: Negative for itching and rash.  Neurological: Negative for dizziness, weakness and headaches.  Endo/Heme/Allergies: Does not bruise/bleed easily.  Psychiatric/Behavioral: Negative for depression. The patient is not nervous/anxious.      Objective:   Vitals:   10/05/20 0943  BP: 140/90  Pulse: 94  Temp: 97.9 F (36.6 C)  TempSrc: Temporal  SpO2: 98%  Weight: 213 lb (96.6 kg)  Height: 5' 5"  (1.651 m)   SpO2: 98 % O2 Device: Nasal cannula O2 Flow Rate (L/min): 1 L/min O2 Type: Continuous O2  Physical Exam: General: Well-appearing, no acute distress HENT: [place], AT Eyes: EOMI, no scleral icterus Respiratory: Clear to auscultation bilaterally.  No crackles, wheezing or rales Cardiovascular: RRR, -M/R/G, no JVD Extremities:-Edema,-tenderness Neuro: AAO x4, CNII-XII grossly intact Skin: Intact, no rashes or bruising Psych: Normal mood, normal affect  Data Reviewed:  Imaging: CTA 09/02/20 - Bilateral GGO with peripheral and lower lobe predominance, segmental PE in RUL, RML RLL CXR 10/05/20 - Interval improvement of infiltrates with persistent lower lobe peripheral airspace opacities L>R  PFT: None  on file  Labs: CBC    Component Value Date/Time   WBC 8.7 09/07/2020 0850   RBC 5.00 09/07/2020 0850   HGB 15.2 (H) 09/07/2020 0850   HGB 12.7 01/31/2019 1015   HCT 45.1 09/07/2020 0850   HCT 38.0 01/31/2019 1015   PLT 586 (H) 09/07/2020 0850   PLT 603 (H) 01/31/2019 1015   MCV 90.2 09/07/2020 0850   MCV 91 01/31/2019 1015   MCH 30.4 09/07/2020 0850   MCHC 33.7 09/07/2020 0850   RDW 14.2 09/07/2020 0850   RDW 12.5 01/31/2019 1015   LYMPHSABS 0.9 09/02/2020 1309   LYMPHSABS 1.2 01/31/2019 1015   MONOABS 0.8 09/02/2020 1309   EOSABS 0.0 09/02/2020 1309   EOSABS 0.0 01/31/2019 1015   BASOSABS 0.0 09/02/2020 1309   BASOSABS 0.0 01/31/2019 1015   BMET    Component Value Date/Time   NA 136 09/02/2020 1309   K 4.0 09/02/2020 1309   CL 101 09/02/2020 1309  CO2 23 09/02/2020 1309   GLUCOSE 132 (H) 09/02/2020 1309   BUN 10 09/02/2020 1309   CREATININE 0.85 09/02/2020 1309   CREATININE 1.03 12/06/2019 0930   CALCIUM 8.9 09/02/2020 1309   GFRNONAA >60 09/02/2020 1309   Imaging, labs and test noted above have been reviewed independently by me.     Assessment & Plan:   Discussion: 50 year old female with allergic rhinitis who presents as a new consult for hospital follow-up. Improving oxygenation however will continue to need supplemental support. Chest XR with partial improvement of her pneumonia. This may represent residual covid pneumonia vs scarring. With her current trajectory, I believe we can wean off her oxygen however if she has not improved by the next visit, this may be her new baseline.  Acute hypoxemic respiratory failure - improving --Recommend continuing 1L oxygen nightly and only as needed during the day to maintain goal SpO2 >88% --Encourage regular activity including 3-5 times a week of 20 minute sessions --Will send message regarding deep breathing exercises  Acute DVT/PE --Continue Eliquis for 3 month course (end April 20)  Nasal Congestion --Continue  saline sprays --Recommend Flonase 1 spray per nare nightly  Health Maintenance Immunization History  Administered Date(s) Administered  . Influenza,inj,Quad PF,6+ Mos 05/21/2018  . Influenza-Unspecified 06/22/2014, 05/09/2015, 05/21/2018   CT Lung Screen - not indicated  Orders Placed This Encounter  Procedures  . DG Chest 2 View    Standing Status:   Future    Number of Occurrences:   1    Standing Expiration Date:   10/05/2021    Order Specific Question:   Reason for Exam (SYMPTOM  OR DIAGNOSIS REQUIRED)    Answer:   covid pneumonia    Order Specific Question:   Preferred imaging location?    Answer:   Internal    Order Specific Question:   Radiology Contrast Protocol - do NOT remove file path    Answer:   [email or web address]  No orders of the defined types were placed in this encounter.   Return in about 3 months (around 01/02/2021).   [place], MD [facility] 10/05/2020 9:56 AM  Office Number [phone]

## 2020-10-22 ENCOUNTER — Other Ambulatory Visit: Payer: Self-pay

## 2020-10-22 ENCOUNTER — Encounter: Payer: Self-pay | Admitting: Nurse Practitioner

## 2020-10-22 ENCOUNTER — Ambulatory Visit: Payer: 59 | Admitting: Nurse Practitioner

## 2020-10-22 VITALS — BP 122/80 | HR 80 | Ht 65.5 in | Wt 212.0 lb

## 2020-10-22 DIAGNOSIS — N951 Menopausal and female climacteric states: Secondary | ICD-10-CM | POA: Diagnosis not present

## 2020-10-22 DIAGNOSIS — Z01419 Encounter for gynecological examination (general) (routine) without abnormal findings: Secondary | ICD-10-CM

## 2020-10-22 NOTE — Progress Notes (Signed)
   Sherry Marquez April 19, 1971 [id number]   History:  50 y.o. [id number] presents for annual exam. Perimenopausal. Husband has vasectomy. 7 cm right ovarian cystectomy 05/2020, repeat ultrasound showed 2 cm cyst present. Normal pap and mammogram history. 08/2020 PE and DVT secondary to Covid, on Eliquis. She was seen at [facility] for heavy menses right after starting Eliquis but has not had any bleeding since. She plans to work with functional nutritionist for weight loss.   Gynecologic History Patient's last menstrual period was 09/05/2020.   Contraception/Family planning: vasectomy  Health Maintenance Last Pap: 09/14/2016. Results were: normal Last mammogram: 06/2020. Results were: normal Last colonoscopy: Never Last Dexa: Never  Past medical history, past surgical history, family history and social history were all reviewed and documented in the EPIC chart.  ROS:  A ROS was performed and pertinent positives and negatives are included.  Exam:  Vitals:   10/22/20 1001  BP: 122/80  Pulse: 80  Weight: 212 lb (96.2 kg)  Height: 5' 5.5" (1.664 m)   Body mass index is 34.74 kg/m.  General appearance:  Normal Thyroid:  Symmetrical, normal in size, without palpable masses or nodularity. Respiratory  Auscultation:  Clear without wheezing or rhonchi Cardiovascular  Auscultation:  Regular rate, without rubs, murmurs or gallops  Edema/varicosities:  Not grossly evident Abdominal  Soft,nontender, without masses, guarding or rebound.  Liver/spleen:  No organomegaly noted  Hernia:  None appreciated  Skin  Inspection:  Grossly normal   Breasts: Examined lying and sitting.   Right: Without masses, retractions, discharge or axillary adenopathy.   Left: Without masses, retractions, discharge or axillary adenopathy. Gentitourinary   Inguinal/mons:  Normal without inguinal adenopathy  External genitalia:  Normal  BUS/Urethra/Skene's glands:  Normal  Vagina:  Normal  Cervix:  Normal  Uterus:   Normal in size, shape and contour.  Midline and mobile  Adnexa/parametria:     Rt: Without masses or tenderness.   Lt: Without masses or tenderness.  Anus and perineum: Normal  Digital rectal exam: Normal sphincter tone without palpated masses or tenderness  Assessment/Plan:  50 y.o. [id number] for annual exam.   Well female exam with routine gynecological exam - Education provided on SBEs, importance of preventative screenings, current guidelines, high calcium diet, regular exercise, and multivitamin daily. Labs with PCP.   Perimenopause - cycles have become irregular ranging from 1-4 months. Complains of night sweats but otherwise has minimal menopausal symptoms.   Screening for cervical cancer - Normal Pap history.  Will repeat at 5-year interval per guidelines.  Screening for breast cancer - Normal mammogram history.  Continue annual screenings.  Normal breast exam today.  Return in 1 year for annual.     Tamela Gammon DNP, 10:08 AM 10/22/2020

## 2020-10-22 NOTE — Patient Instructions (Signed)
Health Maintenance, Female Adopting a healthy lifestyle and getting preventive care are important in promoting health and wellness. Ask your health care provider about:  The right schedule for you to have regular tests and exams.  Things you can do on your own to prevent diseases and keep yourself healthy. What should I know about diet, weight, and exercise? Eat a healthy diet  Eat a diet that includes plenty of vegetables, fruits, low-fat dairy products, and lean protein.  Do not eat a lot of foods that are high in solid fats, added sugars, or sodium.   Maintain a healthy weight Body mass index (BMI) is used to identify weight problems. It estimates body fat based on height and weight. Your health care provider can help determine your BMI and help you achieve or maintain a healthy weight. Get regular exercise Get regular exercise. This is one of the most important things you can do for your health. Most adults should:  Exercise for at least 150 minutes each week. The exercise should increase your heart rate and make you sweat (moderate-intensity exercise).  Do strengthening exercises at least twice a week. This is in addition to the moderate-intensity exercise.  Spend less time sitting. Even light physical activity can be beneficial. Watch cholesterol and blood lipids Have your blood tested for lipids and cholesterol at 49 years of age, then have this test every 5 years. Have your cholesterol levels checked more often if:  Your lipid or cholesterol levels are high.  You are older than 49 years of age.  You are at high risk for heart disease. What should I know about cancer screening? Depending on your health history and family history, you may need to have cancer screening at various ages. This may include screening for:  Breast cancer.  Cervical cancer.  Colorectal cancer.  Skin cancer.  Lung cancer. What should I know about heart disease, diabetes, and high blood  pressure? Blood pressure and heart disease  High blood pressure causes heart disease and increases the risk of stroke. This is more likely to develop in people who have high blood pressure readings, are of African descent, or are overweight.  Have your blood pressure checked: ? Every 3-5 years if you are 18-39 years of age. ? Every year if you are 40 years old or older. Diabetes Have regular diabetes screenings. This checks your fasting blood sugar level. Have the screening done:  Once every three years after age 40 if you are at a normal weight and have a low risk for diabetes.  More often and at a younger age if you are overweight or have a high risk for diabetes. What should I know about preventing infection? Hepatitis B If you have a higher risk for hepatitis B, you should be screened for this virus. Talk with your health care provider to find out if you are at risk for hepatitis B infection. Hepatitis C Testing is recommended for:  Everyone born from 1945 through 1965.  Anyone with known risk factors for hepatitis C. Sexually transmitted infections (STIs)  Get screened for STIs, including gonorrhea and chlamydia, if: ? You are sexually active and are younger than 49 years of age. ? You are older than 49 years of age and your health care provider tells you that you are at risk for this type of infection. ? Your sexual activity has changed since you were last screened, and you are at increased risk for chlamydia or gonorrhea. Ask your health care provider   if you are at risk.  Ask your health care provider about whether you are at high risk for HIV. Your health care provider may recommend a prescription medicine to help prevent HIV infection. If you choose to take medicine to prevent HIV, you should first get tested for HIV. You should then be tested every 3 months for as long as you are taking the medicine. Pregnancy  If you are about to stop having your period (premenopausal) and  you may become pregnant, seek counseling before you get pregnant.  Take 400 to 800 micrograms (mcg) of folic acid every day if you become pregnant.  Ask for birth control (contraception) if you want to prevent pregnancy. Osteoporosis and menopause Osteoporosis is a disease in which the bones lose minerals and strength with aging. This can result in bone fractures. If you are 65 years old or older, or if you are at risk for osteoporosis and fractures, ask your health care provider if you should:  Be screened for bone loss.  Take a calcium or vitamin D supplement to lower your risk of fractures.  Be given hormone replacement therapy (HRT) to treat symptoms of menopause. Follow these instructions at home: Lifestyle  Do not use any products that contain nicotine or tobacco, such as cigarettes, e-cigarettes, and chewing tobacco. If you need help quitting, ask your health care provider.  Do not use street drugs.  Do not share needles.  Ask your health care provider for help if you need support or information about quitting drugs. Alcohol use  Do not drink alcohol if: ? Your health care provider tells you not to drink. ? You are pregnant, may be pregnant, or are planning to become pregnant.  If you drink alcohol: ? Limit how much you use to 0-1 drink a day. ? Limit intake if you are breastfeeding.  Be aware of how much alcohol is in your drink. In the [place] one drink equals one 12 oz bottle of beer (355 mL), one 5 oz glass of wine (148 mL), or one 1 oz glass of hard liquor (44 mL). General instructions  Schedule regular health, dental, and eye exams.  Stay current with your vaccines.  Tell your health care provider if: ? You often feel depressed. ? You have ever been abused or do not feel safe at home. Summary  Adopting a healthy lifestyle and getting preventive care are important in promoting health and wellness.  Follow your health care provider's instructions about healthy  diet, exercising, and getting tested or screened for diseases.  Follow your health care provider's instructions on monitoring your cholesterol and blood pressure. This information is not intended to replace advice given to you by your health care provider. Make sure you discuss any questions you have with your health care provider. Document Revised: 07/18/2018 Document Reviewed: 07/18/2018 Elsevier Patient Education  2021 [employer]vier Inc.  

## 2020-12-03 ENCOUNTER — Other Ambulatory Visit: Payer: Self-pay

## 2020-12-03 ENCOUNTER — Encounter: Payer: Self-pay | Admitting: Pulmonary Disease

## 2020-12-03 ENCOUNTER — Ambulatory Visit: Payer: 59 | Admitting: Pulmonary Disease

## 2020-12-03 VITALS — BP 118/74 | HR 72 | Temp 97.0°F | Ht 65.5 in | Wt 220.8 lb

## 2020-12-03 DIAGNOSIS — I824Y2 Acute embolism and thrombosis of unspecified deep veins of left proximal lower extremity: Secondary | ICD-10-CM | POA: Diagnosis not present

## 2020-12-03 DIAGNOSIS — I2699 Other pulmonary embolism without acute cor pulmonale: Secondary | ICD-10-CM

## 2020-12-03 DIAGNOSIS — J9601 Acute respiratory failure with hypoxia: Secondary | ICD-10-CM | POA: Diagnosis not present

## 2020-12-03 NOTE — Patient Instructions (Addendum)
Acute hypoxemic respiratory failure secondary to COVID-19 pneumonia - resolved --No further oxygen needs. Ok to return oxygen tank --CONTINUE regular activity including 3-5 times a week of 20 minute sessions --CT Chest without contrast in 3 months  Acute DVT/PE s/p 3 months of Eliquis --Completed Eliquis for 3 month course (end April 20)  Nasal Congestion --Continue saline sprays as needed  Follow-up with me in 3 months after CT Chest is completed

## 2020-12-03 NOTE — Progress Notes (Signed)
Subjective:   PATIENT ID: Sherry Marquez: female DOB: 1970-12-22, MRN: [id number]   HPI  Chief Complaint  Patient presents with  . Follow-up    Reports improvement in symptoms, feels "back to normal"    Reason for Visit: Follow-up covid pneumonia   [name] is a 50 year old female with allergic rhinitis who presents for follow-up.  Synopsis: She was hospitalized from 09/02/20 to 09/05/20 for acute hypoxemic respiratory failure secondary to covid complicated by PE/DVT. She required home O2 and steroid taper on discharge. Post discharge she presented to ED on 09/07/20 for menorrhagia secondary to Eliquis. Hemoglobin was stable and hemodynamically stable so she was advised to continue anticoagulation. She has completed steroids. She wears 1L oxygen with activity. Has not been sleeping with it. She reports that she has shortness of breath with activity especially when walking upstairs. Aggravated by stress, brisk walking, walking upstairs. Improves with resting for a minute. Denies wheezing or coughing. Reports nasal congestion. Denies chest congestion. Denies fevers, chills. She is tolerating Eliquis and no further episodes of bleeding. When she had covid she was immobile and now she ambulates around the house, though not up to her normal activity level.   Since our last visit, she reports that she has returned to baseline and able to perform activities without limitation. Able to walk in neighborhoods. Inclines as not as difficult. Shortness of breath has overall resolved. Can sing and run up the stairs.Denies cough or wheezing. She finished her three month course of anticoagulation one week ago.  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   Past Medical History:  Diagnosis Date  . Allergy   . COVID   . Eczema   . Environmental and seasonal allergies   . History of  gestational diabetes   . Hx of blood clots   . LUMBAR SPRAIN AND STRAIN 12/05/2009   Qualifier: Diagnosis of  By: Sarajane Jews MD, Ishmael Holter   . Pneumonia   . Right ovarian cyst   . Rosacea      Allergies  Allergen Reactions  . Cefotan [Cefotetan] Rash    Developed rash during IV infusion- dose completed -benadryl given, no other issues occured     Outpatient Medications Prior to Visit  Medication Sig Dispense Refill  . albuterol (VENTOLIN HFA) 108 (90 Base) MCG/ACT inhaler Inhale 1 puff into the lungs every 4 (four) hours as needed for wheezing or shortness of breath. 18 g 0  . apixaban (ELIQUIS) 5 MG TABS tablet Take 1 tablet (5 mg total) by mouth 2 (two) times daily. 60 tablet 5  . Ascorbic Acid (VITAMIN C) 1000 MG tablet Take 1,000 mg by mouth daily. (Patient not taking: Reported on 10/22/2020)    . cetirizine (ZYRTEC) 10 MG tablet Take 10 mg by mouth daily. (Patient not taking: Reported on 10/22/2020)    . Cholecalciferol (VITAMIN D3) 50 MCG (2000 UT) TABS Take 2,000 Units by mouth daily. (Patient not taking: Reported on 10/22/2020)    . Ipratropium-Albuterol (COMBIVENT RESPIMAT) 20-100 MCG/ACT AERS respimat Inhale 1 puff into the lungs daily. (Patient not taking: Reported on 10/22/2020)     No facility-administered medications prior to visit.    Review of Systems  Constitutional: Negative for chills, diaphoresis, fever, malaise/fatigue and weight loss.  HENT: Negative for congestion.   Respiratory: Negative for cough, hemoptysis, sputum production, shortness of breath and wheezing.   Cardiovascular: Negative for chest pain, palpitations  and leg swelling.     Objective:   Vitals:   12/03/20 1112  BP: 118/74  Pulse: 72  Temp: (!) 97 F (36.1 C)  TempSrc: Temporal  SpO2: 97%  Weight: 220 lb 12.8 oz (100.2 kg)  Height: 5' 5.5" (1.664 m)      Physical Exam: General: Well-appearing, no acute distress HENT: [place], AT Eyes: EOMI, no scleral icterus Respiratory: Clear to  auscultation bilaterally.  No crackles, wheezing or rales Cardiovascular: RRR, -M/R/G, no JVD Extremities:-Edema,-tenderness Neuro: AAO x4, CNII-XII grossly intact Skin: Intact, no rashes or bruising Psych: Normal mood, normal affect   Data Reviewed:  Imaging: CTA 09/02/20 - Bilateral GGO with peripheral and lower lobe predominance, segmental PE in RUL, RML RLL CXR 10/05/20 - Interval improvement of infiltrates with persistent lower lobe peripheral airspace opacities L>R  PFT: None on file  Ambulator O2:  12/03/20 No desaturations. No indication for oxygen  Imaging, labs and test noted above have been reviewed independently by me.     Assessment & Plan:   Discussion: 50 year old female with allergic rhinitis who presents for follow-up for acute hypoxemic respiratory failure secondary to COVID-19 pneumonia and DVT/PE in setting of COVID-19. Ambulatory O2 with no desaturations. Will obtain CT for clearance of infiltrates  Acute hypoxemic respiratory failure secondary to COVID-19 pneumonia - resolved --No further oxygen needs. Ok to return oxygen tank --CONTINUE regular activity including 3-5 times a week of 20 minute sessions --CT Chest without contrast in 3 months  Acute DVT/PE s/p 3 months of Eliquis --Completed Eliquis for 3 month course (end April 20)  Nasal Congestion --Continue saline sprays as needed  Health Maintenance Immunization History  Administered Date(s) Administered  . Influenza,inj,Quad PF,6+ Mos 05/21/2018  . Influenza-Unspecified 06/22/2014, 05/09/2015, 05/21/2018   CT Lung Screen - not indicated  Orders Placed This Encounter  Procedures  . CT Chest Wo Contrast    Standing Status:   Future    Standing Expiration Date:   12/03/2021    Order Specific Question:   Is patient pregnant?    Answer:   Unknown (Please Explain)    Order Specific Question:   Preferred imaging location?    Answer:   [facility]  No orders of the defined types were  placed in this encounter.   Return in about 3 months (around 03/04/2021).   Hana, MD [facility] 12/03/2020 8:40 AM  Office Number [phone]

## 2020-12-19 ENCOUNTER — Encounter: Payer: Self-pay | Admitting: Pulmonary Disease

## 2021-02-02 ENCOUNTER — Other Ambulatory Visit: Payer: 59

## 2021-02-05 ENCOUNTER — Encounter: Payer: Self-pay | Admitting: Family Medicine

## 2021-02-05 DIAGNOSIS — R030 Elevated blood-pressure reading, without diagnosis of hypertension: Secondary | ICD-10-CM

## 2021-02-05 DIAGNOSIS — E785 Hyperlipidemia, unspecified: Secondary | ICD-10-CM

## 2021-02-05 DIAGNOSIS — I2782 Chronic pulmonary embolism: Secondary | ICD-10-CM

## 2021-02-05 DIAGNOSIS — R7309 Other abnormal glucose: Secondary | ICD-10-CM

## 2021-02-24 ENCOUNTER — Other Ambulatory Visit (INDEPENDENT_AMBULATORY_CARE_PROVIDER_SITE_OTHER): Payer: 59

## 2021-02-24 ENCOUNTER — Other Ambulatory Visit: Payer: Self-pay

## 2021-02-24 DIAGNOSIS — R7309 Other abnormal glucose: Secondary | ICD-10-CM | POA: Diagnosis not present

## 2021-02-24 DIAGNOSIS — E785 Hyperlipidemia, unspecified: Secondary | ICD-10-CM

## 2021-02-24 DIAGNOSIS — R030 Elevated blood-pressure reading, without diagnosis of hypertension: Secondary | ICD-10-CM

## 2021-02-24 LAB — CBC WITH DIFFERENTIAL/PLATELET
Basophils Absolute: 0 10*3/uL (ref 0.0–0.1)
Basophils Relative: 0.8 % (ref 0.0–3.0)
Eosinophils Absolute: 0.1 10*3/uL (ref 0.0–0.7)
Eosinophils Relative: 1.6 % (ref 0.0–5.0)
HCT: 44 % (ref 36.0–46.0)
Hemoglobin: 15 g/dL (ref 12.0–15.0)
Lymphocytes Relative: 28 % (ref 12.0–46.0)
Lymphs Abs: 1.3 10*3/uL (ref 0.7–4.0)
MCHC: 34.1 g/dL (ref 30.0–36.0)
MCV: 89.8 fl (ref 78.0–100.0)
Monocytes Absolute: 0.6 10*3/uL (ref 0.1–1.0)
Monocytes Relative: 13.3 % — ABNORMAL HIGH (ref 3.0–12.0)
Neutro Abs: 2.6 10*3/uL (ref 1.4–7.7)
Neutrophils Relative %: 56.3 % (ref 43.0–77.0)
Platelets: 241 10*3/uL (ref 150.0–400.0)
RBC: 4.89 Mil/uL (ref 3.87–5.11)
RDW: 14.8 % (ref 11.5–15.5)
WBC: 4.6 10*3/uL (ref 4.0–10.5)

## 2021-02-24 LAB — LIPID PANEL
Cholesterol: 216 mg/dL — ABNORMAL HIGH (ref 0–200)
HDL: 48.8 mg/dL (ref >39.00)
LDL Cholesterol: 140 mg/dL — ABNORMAL HIGH (ref 0–99)
NonHDL: 167.57
Total CHOL/HDL Ratio: 4
Triglycerides: 138 mg/dL (ref 0.0–149.0)
VLDL: 27.6 mg/dL (ref 0.0–40.0)

## 2021-02-24 LAB — COMPREHENSIVE METABOLIC PANEL
ALT: 23 U/L (ref 0–35)
AST: 20 U/L (ref 0–37)
Albumin: 4 g/dL (ref 3.5–5.2)
Alkaline Phosphatase: 74 U/L (ref 39–117)
BUN: 15 mg/dL (ref 6–23)
CO2: 29 mEq/L (ref 19–32)
Calcium: 8.9 mg/dL (ref 8.4–10.5)
Chloride: 102 mEq/L (ref 96–112)
Creatinine, Ser: 0.93 mg/dL (ref 0.40–1.20)
GFR: 72.61 mL/min (ref >60.00)
Glucose, Bld: 105 mg/dL — ABNORMAL HIGH (ref 70–99)
Potassium: 4.4 mEq/L (ref 3.5–5.1)
Sodium: 137 mEq/L (ref 135–145)
Total Bilirubin: 0.3 mg/dL (ref 0.2–1.2)
Total Protein: 6.3 g/dL (ref 6.0–8.3)

## 2021-02-24 LAB — HEMOGLOBIN A1C: Hgb A1c MFr Bld: 5.7 % (ref 4.6–6.5)

## 2021-03-01 ENCOUNTER — Other Ambulatory Visit: Payer: Self-pay

## 2021-03-01 ENCOUNTER — Encounter: Payer: Self-pay | Admitting: Family Medicine

## 2021-03-01 ENCOUNTER — Ambulatory Visit: Payer: 59 | Admitting: Family Medicine

## 2021-03-01 VITALS — BP 130/88 | HR 67 | Temp 98.5°F | Ht 65.5 in | Wt 228.5 lb

## 2021-03-01 DIAGNOSIS — E669 Obesity, unspecified: Secondary | ICD-10-CM

## 2021-03-01 NOTE — Patient Instructions (Signed)
Consider [place] - 21 day fix real time and real time extreme

## 2021-03-01 NOTE — Progress Notes (Signed)
  Sherry Marquez DOB: 11/01/1970 Encounter date: 03/01/2021  This is a 50 y.o. female who presents with Chief Complaint  Patient presents with   Follow-up    History of present illness: Last visit with me was 09/30/2020.  She was still recovering from serious COVID infection the month prior. She has a CT scan thurs for follow up on lungs. She feels back to normal. Regular activity, breathing feels good. Not exercising as much as she should. Inlaws died 29 mo ago from [place]. Then brother in law was handling estate and called stating he mishandled it; then committed suicide. So now they are managing all three estates.   No significant swelling in legs.   Seasonal allergies: zyrtec just if needed.   Allergies  Allergen Reactions   Cefotan [Cefotetan] Rash    Developed rash during IV infusion- dose completed -benadryl given, no other issues occured   Current Meds  Medication Sig   albuterol (VENTOLIN HFA) 108 (90 Base) MCG/ACT inhaler Inhale 1 puff into the lungs every 4 (four) hours as needed for wheezing or shortness of breath.   cetirizine (ZYRTEC) 10 MG tablet Take 10 mg by mouth daily.   Ipratropium-Albuterol (COMBIVENT RESPIMAT) 20-100 MCG/ACT AERS respimat Inhale 1 puff into the lungs daily.    Review of Systems  Constitutional:  Negative for chills, fatigue and fever.  Respiratory:  Negative for cough, chest tightness, shortness of breath and wheezing.   Cardiovascular:  Negative for chest pain, palpitations and leg swelling.   Objective:  BP 130/88 (BP Location: Left Arm, Patient Position: Sitting, Cuff Size: Large)   Pulse 67   Temp 98.5 F (36.9 C) (Oral)   Ht 5' 5.5" (1.664 m)   Wt 228 lb 8 oz (103.6 kg)   LMP 02/27/2021   SpO2 96%   BMI 37.45 kg/m   Weight: 228 lb 8 oz (103.6 kg)   BP Readings from Last 3 Encounters:  03/01/21 130/88  12/03/20 118/74  10/22/20 122/80   Wt Readings from Last 3 Encounters:  03/01/21 228 lb 8 oz (103.6 kg)  12/03/20 220 lb  12.8 oz (100.2 kg)  10/22/20 212 lb (96.2 kg)    Physical Exam Constitutional:      General: She is not in acute distress.    Appearance: She is well-developed.  Cardiovascular:     Rate and Rhythm: Normal rate and regular rhythm.     Heart sounds: Normal heart sounds. No murmur heard.   No friction rub.  Pulmonary:     Effort: Pulmonary effort is normal. No respiratory distress.     Breath sounds: Normal breath sounds. No wheezing or rales.  Musculoskeletal:     Right lower leg: No edema.     Left lower leg: No edema.  Neurological:     Mental Status: She is alert and oriented to person, place, and time.  Psychiatric:        Behavior: Behavior normal.    Assessment/Plan  1. Obesity (BMI 30-39.9) She is interested in eating healthy and losing weight; getting back on track with diet.  - Amb Ref to Medical Weight Management  feeling back to baseline with breathing! Great news. She has follow up CT in 3 days, but likely will not need further intervention.   Return in about 6 months (around 09/01/2021) for physical exam.    Micheline Rough, MD

## 2021-03-04 ENCOUNTER — Other Ambulatory Visit: Payer: Self-pay

## 2021-03-04 ENCOUNTER — Ambulatory Visit (INDEPENDENT_AMBULATORY_CARE_PROVIDER_SITE_OTHER)
Admission: RE | Admit: 2021-03-04 | Discharge: 2021-03-04 | Disposition: A | Discharge status: Home / Self Care | Payer: 59 | Source: Ambulatory Visit | Attending: Pulmonary Disease | Admitting: Pulmonary Disease

## 2021-03-04 DIAGNOSIS — J9601 Acute respiratory failure with hypoxia: Secondary | ICD-10-CM | POA: Diagnosis not present

## 2021-04-19 DIAGNOSIS — Z0289 Encounter for other administrative examinations: Secondary | ICD-10-CM

## 2021-04-28 ENCOUNTER — Ambulatory Visit (INDEPENDENT_AMBULATORY_CARE_PROVIDER_SITE_OTHER): Payer: 59 | Admitting: Bariatrics

## 2021-04-28 ENCOUNTER — Encounter (INDEPENDENT_AMBULATORY_CARE_PROVIDER_SITE_OTHER): Payer: Self-pay | Admitting: Bariatrics

## 2021-04-28 ENCOUNTER — Other Ambulatory Visit: Payer: Self-pay

## 2021-04-28 VITALS — BP 148/86 | HR 66 | Temp 97.8°F | Ht 66.0 in | Wt 232.0 lb

## 2021-04-28 DIAGNOSIS — Z9189 Other specified personal risk factors, not elsewhere classified: Secondary | ICD-10-CM

## 2021-04-28 DIAGNOSIS — Z1331 Encounter for screening for depression: Secondary | ICD-10-CM | POA: Diagnosis not present

## 2021-04-28 DIAGNOSIS — R030 Elevated blood-pressure reading, without diagnosis of hypertension: Secondary | ICD-10-CM

## 2021-04-28 DIAGNOSIS — F5089 Other specified eating disorder: Secondary | ICD-10-CM

## 2021-04-28 DIAGNOSIS — Z6837 Body mass index (BMI) 37.0-37.9, adult: Secondary | ICD-10-CM

## 2021-04-28 DIAGNOSIS — R7303 Prediabetes: Secondary | ICD-10-CM

## 2021-04-28 DIAGNOSIS — K5909 Other constipation: Secondary | ICD-10-CM

## 2021-04-28 DIAGNOSIS — R0602 Shortness of breath: Secondary | ICD-10-CM | POA: Diagnosis not present

## 2021-04-28 DIAGNOSIS — R5383 Other fatigue: Secondary | ICD-10-CM

## 2021-04-28 DIAGNOSIS — E559 Vitamin D deficiency, unspecified: Secondary | ICD-10-CM | POA: Diagnosis not present

## 2021-04-28 NOTE — Progress Notes (Signed)
Chief Complaint:   OBESITY Sherry Marquez (MR# [id number]) is a 50 y.o. female who presents for evaluation and treatment of obesity and related comorbidities. Current BMI is Body mass index is 37.45 kg/m. Annessa has been struggling with her weight for many years and has been unsuccessful in either losing weight, maintaining weight loss, or reaching her healthy weight goal.  Charnette states that her son is allergic to peanuts (tree nuts) and her plan will need to be free of nuts.  Norabelle is currently in the action stage of change and ready to dedicate time achieving and maintaining a healthier weight. Kree is interested in becoming our patient and working on intensive lifestyle modifications including (but not limited to) diet and exercise for weight loss.  Latonia's habits were reviewed today and are as follows: Her family eats meals together, she thinks her family will eat healthier with her, her desired weight loss is 65 pounds, she started gaining weight post kids (2009), her heaviest weight ever was 225 pounds, she has significant food cravings issues, she snacks frequently in the evenings, she is frequently drinking liquids with calories, she frequently makes poor food choices, she frequently eats larger portions than normal, she has binge eating behaviors, and she struggles with emotional eating.  Depression Screen Marcheta's Food and Mood (modified PHQ-9) score was 15.  Depression screen [facility] 2/9 04/28/2021  Decreased Interest 2  Down, Depressed, Hopeless 1  PHQ - 2 Score 3  Altered sleeping 3  Tired, decreased energy 3  Change in appetite 2  Feeling bad or failure about yourself  2  Trouble concentrating 1  Moving slowly or fidgety/restless 1  Suicidal thoughts 0  PHQ-9 Score 15  Difficult doing work/chores Somewhat difficult   Subjective:   1. Other fatigue Yajahira admits to daytime somnolence and admits to waking up still tired. She will continue activities. Sanvi naps in the  afternoon. Patent has a history of symptoms of morning fatigue and half times in the middle of the night. Anacristina generally gets 8 hours of sleep per night, and states that she has nightime awakenings. Snoring is present. Apneic episodes is not present. Epworth Sleepiness Score is 12.   2. SOB (shortness of breath) on exertion Chameka notes increasing shortness of breath with exercising and seems to be worsening over time with weight gain. She notes getting out of breath sooner with activity than she used to. This has not gotten worse recently. Larry denies shortness of breath at rest or orthopnea.   3. Other constipation Jodie is at increased risk for constipation due to increased protein.   4. Pre-diabetes Colbie is currently not on medications. She has a history of gestational diabetes mellitus in second trimester.  5. Vitamin D deficiency Sahvanna is not taking Vitamin D or any other vitamins.  6. Elevated blood pressure reading Storm's blood pressure is elevated today. She is not currently on medications.  7. Other disorder of eating Katoya is struggling with emotional eating and using food for comfort to the extent that it is negatively impacting her health. She has been working on behavior modification techniques to help reduce her emotional eating and has been unsuccessful. She shows no sign of suicidal or homicidal ideations.   8. At risk for diabetes mellitus Kanyon is at higher than average risk for developing diabetes due to Pre-diabetes.    Assessment/Plan:   1. Other fatigue Brendalyn will gradually increase activities. We will check thyroid panel today. She does feel  that her weight is causing her energy to be lower than it should be. Fatigue may be related to obesity, depression or many other causes. Labs will be ordered, and in the meanwhile, Dede will focus on self care including making healthy food choices, increasing physical activity and focusing on stress reduction.  - EKG  12-Lead - T3 - T4, free - TSH  2. SOB (shortness of breath) on exertion Gillie does feel that she gets out of breath more easily that she used to when she exercises. Oluwatomisin's shortness of breath appears to be obesity related and exercise induced. She has agreed to work on weight loss and gradually increase exercise to treat her exercise induced shortness of breath. Will continue to monitor closely.  - T3 - T4, free - TSH  3. Other constipation Deanna was advised that she may notice an increase in constipation with increased protein. She is to increase her water and fiber intake (flax seed and brain). She was informed that a decrease in bowel movement frequency is normal while losing weight, but stools should not be hard or painful. Orders and follow up as documented in patient record.   Counseling Getting to Good Bowel Health: Your goal is to have one soft bowel movement each day. Drink at least 8 glasses of water each day. Eat plenty of fiber (goal is over 25 grams each day). It is best to get most of your fiber from dietary sources which includes leafy green vegetables, fresh fruit, and whole grains. You may need to add fiber with the help of OTC fiber supplements. These include Metamucil, Citrucel, and Flaxseed. If you are still having trouble, try adding Miralax or Magnesium Citrate. If all of these changes do not work, Cabin crew.   4. Pre-diabetes Tiaira will continue to work on weight loss, increasing activities, and decreasing simple carbohydrates to help decrease the risk of diabetes. We will check insulin today.  - Insulin, random  5. Vitamin D deficiency Low Vitamin D level contributes to fatigue and are associated with obesity, breast, and colon cancer. We will check labs today. Zafirah will follow-up for routine testing of Vitamin D, at least 2-3 times per year to avoid over-replacement.  - VITAMIN D 25 Hydroxy (Vit-D Deficiency, Fractures)  6. Elevated blood pressure  reading Karesha will work on healthy weight loss and exercise to improve blood pressure control. We will continue to follow over time as she continues her lifestyle modifications.  7. Other disorder of eating Behavior modification techniques were discussed today to help Berenize deal with her emotional/non-hunger eating behaviors. We will refer Syvilla to Dr. Mallie Mussel, our Bariatric Psychologist for evaluation. Orders and follow up as documented in patient record.   8. Depression screen Arnold was screened for depression as part of new patient work up. Behavior modification techniques were discussed today to help Shanekia deal with her emotional/non-hunger eating behaviors.  Orders and follow up as documented in patient record.    9. At risk for diabetes mellitus Daly was given approximately 15 minutes of diabetes education and counseling today. We discussed intensive lifestyle modifications today with an emphasis on weight loss as well as increasing exercise and decreasing simple carbohydrates in her diet. We also reviewed medication options with an emphasis on risk versus benefit of those discussed.   Repetitive spaced learning was employed today to elicit superior memory formation and behavioral change.   10. Class 2 severe obesity with serious comorbidity and body mass index (BMI) of 37.0 to  37.9 in adult, unspecified obesity type ([place]) Alizay is currently in the action stage of change and her goal is to continue with weight loss efforts. I recommend Landree begin the structured treatment plan as follows:  She has agreed to the Category 3 Plan or keeping a food journal and adhering to recommended goals of 1500 calories and 90 grams of protein daily.  I reviewed labs from 02/24/2021 with the patient today.  Exercise goals: No exercise has been prescribed at this time.   Behavioral modification strategies: increasing lean protein intake, decreasing simple carbohydrates, increasing vegetables, increasing  water intake, decreasing liquid calories, decreasing eating out, no skipping meals, meal planning and cooking strategies, keeping healthy foods in the home, and planning for success.  She was informed of the importance of frequent follow-up visits to maximize her success with intensive lifestyle modifications for her multiple health conditions. She was informed we would discuss her lab results at her next visit unless there is a critical issue that needs to be addressed sooner. Halea agreed to keep her next visit at the agreed upon time to discuss these results.  Objective:   Blood pressure (!) 148/86, pulse 66, temperature 97.8 F (36.6 C), height 5' 6"  (1.676 m), weight 232 lb (105.2 kg), last menstrual period 04/08/2021, SpO2 99 %. Body mass index is 37.45 kg/m.  EKG: Normal sinus rhythm, rate 67 BPM.  Indirect Calorimeter completed today shows a VO2 of 278 and a REE of 1915.  Her calculated basal metabolic rate is 7353 thus her basal metabolic rate is worse than expected.  General: Cooperative, alert, well developed, in no acute distress. HEENT: Conjunctivae and lids unremarkable. Cardiovascular: Regular rhythm.  Lungs: Normal work of breathing. Neurologic: No focal deficits.   Lab Results  Component Value Date   CREATININE 0.93 02/24/2021   BUN 15 02/24/2021   NA 137 02/24/2021   K 4.4 02/24/2021   CL 102 02/24/2021   CO2 29 02/24/2021   Lab Results  Component Value Date   ALT 23 02/24/2021   AST 20 02/24/2021   ALKPHOS 74 02/24/2021   BILITOT 0.3 02/24/2021   Lab Results  Component Value Date   HGBA1C 5.7 02/24/2021   HGBA1C 5.7 10/29/2013   No results found for: INSULIN Lab Results  Component Value Date   TSH 1.91 03/29/2019   Lab Results  Component Value Date   CHOL 216 (H) 02/24/2021   HDL 48.80 02/24/2021   LDLCALC 140 (H) 02/24/2021   TRIG 138.0 02/24/2021   CHOLHDL 4 02/24/2021   Lab Results  Component Value Date   WBC 4.6 02/24/2021   HGB 15.0  02/24/2021   HCT 44.0 02/24/2021   MCV 89.8 02/24/2021   PLT 241.0 02/24/2021   Lab Results  Component Value Date   FERRITIN 58 09/05/2020   Attestation Statements:   Reviewed by clinician on day of visit: allergies, medications, problem list, medical history, surgical history, family history, social history, and previous encounter notes.  I, Lizbeth Bark, RMA, am acting as Location manager for [employer], DO.   I have reviewed the above documentation for accuracy and completeness, and I agree with the above. Jearld Lesch, DO

## 2021-04-29 ENCOUNTER — Encounter (INDEPENDENT_AMBULATORY_CARE_PROVIDER_SITE_OTHER): Payer: Self-pay | Admitting: Bariatrics

## 2021-04-29 DIAGNOSIS — E559 Vitamin D deficiency, unspecified: Secondary | ICD-10-CM | POA: Insufficient documentation

## 2021-04-29 LAB — T4, FREE: Free T4: 0.99 ng/dL (ref 0.82–1.77)

## 2021-04-29 LAB — T3: T3, Total: 134 ng/dL (ref 71–180)

## 2021-04-29 LAB — TSH: TSH: 2.34 u[IU]/mL (ref 0.450–4.500)

## 2021-04-29 LAB — VITAMIN D 25 HYDROXY (VIT D DEFICIENCY, FRACTURES): Vit D, 25-Hydroxy: 22.1 ng/mL — ABNORMAL LOW (ref 30.0–100.0)

## 2021-04-29 LAB — INSULIN, RANDOM: INSULIN: 12.2 u[IU]/mL (ref 2.6–24.9)

## 2021-05-10 NOTE — Progress Notes (Signed)
Office: [phone]  /  Fax: [phone]    Date: May 24, 2021   Appointment Start Time: 12:02pm Duration: 38 minutes Provider: Glennie Isle, Psy.D. Type of Session: Intake for Individual Therapy  Location of Patient: Home (private room) Location of Provider: Provider's home (private office) Type of Contact: Telepsychological Visit via MyChart Video Visit  Informed Consent: Prior to proceeding with today's appointment, two pieces of identifying information were obtained. In addition, Sherry Marquez's physical location at the time of this appointment was obtained as well Sherry Marquez phone number she could be reached at in the event of technical difficulties. Sherry Marquez and this provider participated in today's telepsychological service.   The provider's role was explained to Sherry Marquez. The provider reviewed and discussed issues of confidentiality, privacy, and limits therein (e.g., reporting obligations). In addition to verbal informed consent, written informed consent for psychological services was obtained prior to the initial appointment. Since the clinic is not Sherry Marquez 24/7 crisis center, mental health emergency resources were shared and this  provider explained MyChart, e-mail, voicemail, and/or other messaging systems should be utilized only for non-emergency reasons. This provider also explained that information obtained during appointments will be placed in Sherry Marquez's medical record and relevant information will be shared with other providers at [facility] for coordination of care. Sherry Marquez agreed information may be shared with other [facility] providers as needed for coordination of care and by signing the service agreement document, she provided written consent for coordination of care. Prior to initiating telepsychological services, Sherry Marquez completed an informed consent document, which included the development of Sherry Marquez safety plan (i.e., an emergency contact and emergency resources) in the  event of an emergency/crisis. Sherry Marquez verbally acknowledged understanding she is ultimately responsible for understanding her insurance benefits for telepsychological and in-person services. This provider also reviewed confidentiality, as it relates to telepsychological services, as well as the rationale for telepsychological services (i.e., to reduce exposure risk to COVID-19). Sherry Marquez  acknowledged understanding that appointments cannot be recorded without both party consent and she is aware she is responsible for securing confidentiality on her end of the session. Sherry Marquez verbally consented to proceed.  Of note, today's appointment was switched to Sherry Marquez regular telephone call at 12:12pm with Sherry Marquez's verbal consent due to technical issues.   Chief Complaint/HPI: Sherry Marquez was referred by Dr. Jearld Lesch due to  other disorder of eating . Per the note for the initial visit with Dr. Jearld Lesch on April 28, 2021, "Sherry Marquez is struggling with emotional eating and using food for comfort to the extent that it is negatively impacting her health. She has been working on behavior modification techniques to help reduce her emotional eating and has been unsuccessful. She shows no sign of suicidal or homicidal ideations." The note for the initial appointment with Dr. Jearld Lesch further indicated the following: "Her family eats meals together, she thinks her family will eat healthier with her, her desired weight loss is 65 pounds, she started gaining weight post kids (2009), her heaviest weight ever was 225 pounds, she has significant food cravings issues, she snacks frequently in the evenings, she is frequently drinking liquids with calories, she frequently makes poor food choices, she frequently eats larger portions than normal, she has binge eating behaviors, and she struggles with emotional eating." Sherry Marquez's Food and Mood (modified PHQ-9) score on April 28, 2021 was 15.  During today's appointment, Sherry Marquez was verbally  administered Sherry Marquez questionnaire assessing various behaviors related to emotional eating behaviors. Sherry Marquez endorsed the  following: overeat when you are celebrating, experience food cravings on Sherry Marquez regular basis, eat certain foods when you are anxious, stressed, depressed, or your feelings are hurt, use food to help you cope with emotional situations, find food is comforting to you, overeat when you are angry or upset, overeat when you are worried about something, overeat frequently when you are bored or lonely, overeat when you are angry at someone just to show them they cannot control you, overeat when you are alone, but eat much less when you are with other people, and eat as Sherry Marquez reward. She shared she craves "sugary things" and sometimes "fatty things." Sherry Marquez believes the onset of emotional eating behaviors was likely in early adulthood. She indicated Sherry Marquez reduction in emotional eating behaviors since starting with the clinic. In addition, Sherry Marquez denied Sherry Marquez history of binge eating behaviors. Sherry Marquez denied Sherry Marquez history of restricting food intake, purging and engagement in other compensatory strategies, and has never been diagnosed with an eating disorder. She also denied Sherry Marquez history of treatment for emotional eating. Currently, Sherry Marquez indicated stress and grief triggers emotional eating behaviors, whereas having Sherry Marquez structured meal plan and improved energy levels makes emotional eating behaviors better. Furthermore, Sherry Marquez reported her mother and father in law passed away due to COVID-19 in the past year. In January 2022, she shared she was diagnosed with COVID-19 and subsequently suffered Sherry Marquez blood clot. She also discussed other losses and family conflict since then.   Mental Status Examination:  Appearance: well groomed and appropriate hygiene  Behavior: appropriate to circumstances Mood: anxious Affect: mood congruent; tearful  Speech: normal in rate, volume, and tone Eye Contact: appropriate Psychomotor Activity: appropriate   Gait: unable to assess  Thought Process: linear, logical, and goal directed  Thought Content/Perception: denies suicidal and homicidal ideation, plan, and intent, no hallucinations, delusions, bizarre thinking or behavior reported or observed, and denies ideation and engagement in self-injurious behaviors Orientation: time, person, place, and purpose of appointment Memory/Concentration: memory, attention, language, and fund of knowledge intact  Insight/Judgment: fair  Family & Psychosocial History: Sherry Marquez reported she is married and she has two children (ages 44 and 2). 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 Currently, Sherry Marquez's social support system consists of her husband, parents, and few friends. Moreover, Sherry Marquez stated she resides with her husband and children.   Medical History:  Past Medical History:  Diagnosis Date   Allergy    Anxiety    Back pain    COVID    Diabetes ([place])    Eczema    Environmental and seasonal allergies    High cholesterol    History of gestational diabetes    Hx of blood clots    Joint pain    Lack of energy    LUMBAR SPRAIN AND STRAIN 12/05/2009   Qualifier: Diagnosis of  By: Sarajane Jews MD, Ishmael Holter    Obesity    Osteoarthritis    Pneumonia    Right ovarian cyst    Rosacea    SOB (shortness of breath)    Past Surgical History:  Procedure Laterality Date   CESAREAN SECTION  03-15-2005   dr Phineas Real  @[facility]    LAPAROSCOPY N/Sherry Marquez 05/20/2019   Procedure: LAPAROSCOPY DIAGNOSTIC;  Surgeon: Anastasio Auerbach, MD;  Location: [facility];  Service: Gynecology;  Laterality: N/Sherry Marquez;   WISDOM TOOTH EXTRACTION  2001   Current Outpatient Medications  on File Prior to Visit  Medication Sig Dispense Refill   cetirizine (ZYRTEC) 10 MG tablet Take 10 mg by mouth daily.     ibuprofen (ADVIL) 400 MG tablet Take 400 mg by mouth every  6 (six) hours as needed.     POTASSIUM CHLORIDE PO Take 640 mg by mouth.     Vitamin D, Ergocalciferol, (DRISDOL) 1.25 MG (50000 UNIT) CAPS capsule Take 1 capsule (50,000 Units total) by mouth every 7 (seven) days. 4 capsule 0   No current facility-administered medications on file prior to visit.  Medication compliant.   Mental Health History: Sherry Marquez reported she has never attended therapeutic services. She denied Sherry Marquez history of psychotropic medications. Sherry Marquez reported there is no history of hospitalizations for psychiatric concerns. She shared her brother is Sherry Marquez "recovering alcoholic" and her father has Sherry Marquez history of anxiety. Sherry Marquez reported there is no history of trauma including psychological, physical , and sexual abuse, as well as neglect.  Sherry Marquez described her typical mood lately as "improved" since starting with the clinic. Aside from concerns noted above and endorsed on the PHQ-9 and GAD-7, Sherry Marquez reported experiencing social withdrawal due to weight. She also described feeling "foggy," but noted an improvement since starting with the clinic. Sherry Marquez reported consuming one glass of wine every 3-4 months. She denied tobacco use. She denied illicit/recreational substance use. Regarding caffeine intake, Sherry Marquez reported consuming one cup of tea or coffee daily. Furthermore, Sherry Marquez indicated she is not experiencing the following: hallucinations and delusions, paranoia, symptoms of mania , crying spells, panic attacks, memory concerns, attention and concentration issues, and obsessions and compulsions. She also denied history of and current suicidal ideation, plan, and intent; history of and current homicidal ideation, plan, and intent; and history of and current engagement in self-harm.  The following strengths were reported by Elody: organized, kind, helping others, emotionally intelligent, and creative. The following strengths were observed by this provider: ability to express thoughts and feelings during the  therapeutic session, ability to establish and benefit from Sherry Marquez therapeutic relationship, willingness to work toward established goal(s) with the clinic and ability to engage in reciprocal conversation.   Legal History: Sherry Marquez reported there is no history of legal involvement.   Structured Assessments Results: The Patient Health Questionnaire-9 (PHQ-9) is Sherry Marquez self-report measure that assesses symptoms and severity of depression over the course of the last two weeks. Akeila obtained Sherry Marquez score of 3 suggesting minimal depression. Alette finds the endorsed symptoms to be not difficult at all. [0= Not at all; 1= Several days; 2= More than half the days; 3= Nearly every day] Little interest or pleasure in doing things 1  Feeling down, depressed, or hopeless 0  Trouble falling or staying asleep, or sleeping too much 1  Feeling tired or having little energy 0  Poor appetite or overeating 0  Feeling bad about yourself --- or that you are Sherry Marquez failure or have let yourself or your family down 0  Trouble concentrating on things, such as reading the newspaper or watching television 1  Moving or speaking so slowly that other people could have noticed? Or the opposite --- being so fidgety or restless that you have been moving around Sherry Marquez lot more than usual 0  Thoughts that you would be better off dead or hurting yourself in some way 0  PHQ-9 Score 3    The Generalized Anxiety Disorder-7 (GAD-7) is Sherry Marquez brief self-report measure that assesses symptoms of anxiety over the course of the last two weeks. Ulyana obtained  Sherry Marquez score of 1 suggesting minimal anxiety. Jerriah finds the endorsed symptoms to be not difficult at all. [0= Not at all; 1= Several days; 2= Over half the days; 3= Nearly every day] Feeling nervous, anxious, on edge 0  Not being able to stop or control worrying 0  Worrying too much about different things 0  Trouble relaxing 1  Being so restless that it's hard to sit still 0  Becoming easily annoyed or irritable 0   Feeling afraid as if something awful might happen 0  GAD-7 Score 1   Interventions:  Conducted Sherry Marquez chart review Focused on rapport building Verbally administered PHQ-9 and GAD-7 for symptom monitoring Verbally administered Food & Mood questionnaire to assess various behaviors related to emotional eating Provided emphatic reflections and validation Collaborated with patient on Sherry Marquez treatment goal  Psychoeducation provided regarding physical versus emotional hunger Recommended/discussed option for longer-term therapeutic services  Provisional DSM-5 Diagnosis(es): F50.89 Other Specified Feeding or Eating Disorder, Emotional Eating Behaviors and P94.3 Uncomplicated Bereavement  Plan: Kaelyn appears able and willing to participate as evidenced by collaboration on Sherry Marquez treatment goal, engagement in reciprocal conversation, and asking questions as needed for clarification. The next appointment will be scheduled in approximately two weeks, which will be via MyChart Video Visit. The following treatment goal was established: increase coping skills. This provider will regularly review the treatment plan and medical chart to keep informed of status changes. Mone expressed understanding and agreement with the initial treatment plan of care. Shalaya will be sent Sherry Marquez handout via e-mail to utilize between now and the next appointment to increase awareness of hunger patterns and subsequent eating. Marlynn provided verbal consent during today's appointment for this provider to send the handout via e-mail. Additionally, Danasia provided verbal consent for this provider to place Sherry Marquez referral with [facility] to address ongoing stressors and grief.

## 2021-05-12 ENCOUNTER — Other Ambulatory Visit: Payer: Self-pay

## 2021-05-12 ENCOUNTER — Ambulatory Visit (INDEPENDENT_AMBULATORY_CARE_PROVIDER_SITE_OTHER): Payer: 59 | Admitting: Bariatrics

## 2021-05-12 ENCOUNTER — Encounter (INDEPENDENT_AMBULATORY_CARE_PROVIDER_SITE_OTHER): Payer: Self-pay | Admitting: Bariatrics

## 2021-05-12 VITALS — BP 128/86 | HR 58 | Temp 97.7°F | Ht 66.0 in | Wt 226.0 lb

## 2021-05-12 DIAGNOSIS — Z9189 Other specified personal risk factors, not elsewhere classified: Secondary | ICD-10-CM | POA: Diagnosis not present

## 2021-05-12 DIAGNOSIS — R7303 Prediabetes: Secondary | ICD-10-CM | POA: Diagnosis not present

## 2021-05-12 DIAGNOSIS — Z6837 Body mass index (BMI) 37.0-37.9, adult: Secondary | ICD-10-CM

## 2021-05-12 DIAGNOSIS — E559 Vitamin D deficiency, unspecified: Secondary | ICD-10-CM | POA: Diagnosis not present

## 2021-05-12 MED ORDER — VITAMIN D (ERGOCALCIFEROL) 1.25 MG (50000 UNIT) PO CAPS: 50000.0000 [IU] | ORAL_CAPSULE | ORAL | 0 refills | Status: DC

## 2021-05-12 NOTE — Progress Notes (Signed)
Chief Complaint:   OBESITY Sherry Marquez is here to discuss her progress with her obesity treatment plan along with follow-up of her obesity related diagnoses. Sherry Marquez is on the Category 3 Plan and states she is following her eating plan approximately 99% of the time. Sherry Marquez states she is doing 0 minutes 0 times per week.  Today's visit was #: 3 Starting weight: 232 lbs Starting date: 04/28/2021 Today's weight: 226 lbs Today's date: 05/12/2021 Total lbs lost to date: 6 lbs Total lbs lost since last in-office visit: 6 lbs  Interim History: Sherry Marquez is down 6 lbs from her first visit. She was not hungry doing that time but had some cravings.   Subjective:   1. Vitamin D insufficiency Azhar is currently not on Vitamin D.  2. Pre-diabetes Sherry Marquez has a diagnosis of prediabetes based on her elevated HgA1c and was informed this puts her at greater risk of developing diabetes. She continues to work on diet and exercise to decrease her risk of diabetes. She denies nausea or hypoglycemia. Sherry Marquez's last A1C level was 5.7. Her Insulin was 12.2.  3. At risk for diabetes mellitus Sherry Marquez is at risk for diabetes mellitus due to Pre-diabetes.   Assessment/Plan:   1. Vitamin D insufficiency Low Vitamin D level contributes to fatigue and are associated with obesity, breast, and colon cancer. Sherry Marquez agrees to start to take prescription Vitamin D 50,000 IU every week for 1 month with no refills and she will follow-up for routine testing of Vitamin D, at least 2-3 times per year to avoid over-replacement.  - Vitamin D, Ergocalciferol, (DRISDOL) 1.25 MG (50000 UNIT) CAPS capsule; Take 1 capsule (50,000 Units total) by mouth every 7 (seven) days.  Dispense: 4 capsule; Refill: 0  2. Pre-diabetes Sherry Marquez was given a handout on Insulin resistance and Pre-diabetes today. She will continue to work on weight loss, exercise, and decreasing simple carbohydrates to help decrease the risk of diabetes.   3. At risk for diabetes  mellitus Sherry Marquez was given approximately 15 minutes of diabetes education and counseling today. We discussed intensive lifestyle modifications today with an emphasis on weight loss as well as increasing exercise and decreasing simple carbohydrates in her diet. We also reviewed medication options with an emphasis on risk versus benefit of those discussed.   Repetitive spaced learning was employed today to elicit superior memory formation and behavioral change.   4. Obesity, current BMI 36.5 Sherry Marquez is currently in the action stage of change. As such, her goal is to continue with weight loss efforts. She has agreed to the Category 3 Plan.   Sherry Marquez will continue meal planning and intentional eating. We will discuss labs from 04/28/2021. Her protein is equivalent.   Exercise goals:  Sherry Marquez will walk with light weights.   Behavioral modification strategies: increasing lean protein intake, decreasing simple carbohydrates, increasing vegetables, increasing water intake, decreasing eating out, no skipping meals, meal planning and cooking strategies, keeping healthy foods in the home, and planning for success.  Sherry Marquez has agreed to follow-up with our clinic in 2-4 weeks. She was informed of the importance of frequent follow-up visits to maximize her success with intensive lifestyle modifications for her multiple health conditions.   Objective:   Pulse (!) 58, temperature 97.7 F (36.5 C), height 5' 6"  (1.676 m), weight 226 lb (102.5 kg), SpO2 98 %. Body mass index is 36.48 kg/m.  General: Cooperative, alert, well developed, in no acute distress. HEENT: Conjunctivae and lids unremarkable. Cardiovascular: Regular rhythm.  Lungs:  Normal work of breathing. Neurologic: No focal deficits.   Lab Results  Component Value Date   CREATININE 0.93 02/24/2021   BUN 15 02/24/2021   NA 137 02/24/2021   K 4.4 02/24/2021   CL 102 02/24/2021   CO2 29 02/24/2021   Lab Results  Component Value Date   ALT 23  02/24/2021   AST 20 02/24/2021   ALKPHOS 74 02/24/2021   BILITOT 0.3 02/24/2021   Lab Results  Component Value Date   HGBA1C 5.7 02/24/2021   HGBA1C 5.7 10/29/2013   Lab Results  Component Value Date   INSULIN 12.2 04/28/2021   Lab Results  Component Value Date   TSH 2.340 04/28/2021   Lab Results  Component Value Date   CHOL 216 (H) 02/24/2021   HDL 48.80 02/24/2021   LDLCALC 140 (H) 02/24/2021   TRIG 138.0 02/24/2021   CHOLHDL 4 02/24/2021   Lab Results  Component Value Date   VD25OH 22.1 (L) 04/28/2021   Lab Results  Component Value Date   WBC 4.6 02/24/2021   HGB 15.0 02/24/2021   HCT 44.0 02/24/2021   MCV 89.8 02/24/2021   PLT 241.0 02/24/2021   Lab Results  Component Value Date   FERRITIN 58 09/05/2020   Attestation Statements:   Reviewed by clinician on day of visit: allergies, medications, problem list, medical history, surgical history, family history, social history, and previous encounter notes.  I, Lizbeth Bark, RMA, am acting as Location manager for [employer], DO.   I have reviewed the above documentation for accuracy and completeness, and I agree with the above. Jearld Lesch, DO

## 2021-05-13 ENCOUNTER — Encounter (INDEPENDENT_AMBULATORY_CARE_PROVIDER_SITE_OTHER): Payer: Self-pay | Admitting: Bariatrics

## 2021-05-24 ENCOUNTER — Telehealth (INDEPENDENT_AMBULATORY_CARE_PROVIDER_SITE_OTHER): Payer: 59 | Admitting: Psychology

## 2021-05-24 DIAGNOSIS — Z634 Disappearance and death of family member: Secondary | ICD-10-CM

## 2021-05-24 DIAGNOSIS — F5089 Other specified eating disorder: Secondary | ICD-10-CM

## 2021-05-25 NOTE — Progress Notes (Signed)
  Office: [phone]  /  Fax: [phone]    Date: June 08, 2021   Appointment Start Time: 11:02am Duration: 25 minutes Provider: Glennie Isle, Psy.D. Type of Session: Individual Therapy  Location of Patient: Home (private room) Location of Provider: Provider's Home (private office) Type of Contact: Telepsychological Visit via MyChart Video Visit  Session Content: Sherry Marquez is a 50 y.o. female presenting for a follow-up appointment to address the previously established treatment goal of increasing coping skills.Today's appointment was a telepsychological visit due to COVID-19. Sherry Marquez provided verbal consent for today's telepsychological appointment and she is aware she is responsible for securing confidentiality on her end of the session. Prior to proceeding with today's appointment, Anusha's physical location at the time of this appointment was obtained as well a phone number she could be reached at in the event of technical difficulties. [name] and this provider participated in today's telepsychological service.   This provider conducted a brief check-in. Londan reported she is trying to "pay attention" to her mood when she wants to eat. She acknowledged boredom is a "big" trigger for emotional eating behaviors; however, she indicated a reduction in engagement in emotional eating behaviors. Briefly discussed the dieting mentality. Psychoeducation regarding the importance of self-care utilizing the oxygen mask metaphor was provided. Additionally, psychoeducation regarding pleasurable activities, including its impact on emotional eating and overall well-being was provided. Chante was provided with a handout with various options of pleasurable activities, and was encouraged to engage in one activity a day and additional activities as needed when triggered to emotionally eat. Terianna agreed. Monifa provided verbal consent during today's appointment for this provider to send a handout with pleasurable  activities via e-mail. Overall, Ketty was receptive to today's appointment as evidenced by openness to sharing, responsiveness to feedback, and willingness to engage in pleasurable activities.   Mental Status Examination:  Appearance: well groomed and appropriate hygiene  Behavior: appropriate to circumstances Mood: euthymic Affect: mood congruent Speech: normal in rate, volume, and tone Eye Contact: appropriate  Psychomotor Activity: appropriate Gait: unable to assess Thought Process: linear, logical, and goal directed  Thought Content/Perception: no hallucinations, delusions, bizarre thinking or behavior reported or observed and no evidence or endorsement of suicidal and homicidal ideation, plan, and intent Orientation: time, person, place, and purpose of appointment Memory/Concentration: memory, attention, language, and fund of knowledge intact  Insight/Judgment: good  Interventions:  Conducted a brief chart review Provided empathic reflections and validation Reviewed content from the previous session Employed supportive psychotherapy interventions to facilitate reduced distress and to improve coping skills with identified stressors Psychoeducation provided regarding pleasurable activities Psychoeducation provided regarding self-care  DSM-5 Diagnosis(es): F50.89 Other Specified Feeding or Eating Disorder, Emotional Eating Behaviors and J44.9 Uncomplicated Bereavement  Treatment Goal & Progress: During the initial appointment with this provider, the following treatment goal was established: increase coping skills. Via has some demonstrated progress in her goal as evidenced by increased awareness of hunger patterns.   Plan: Based on appointment availability and Daley's schedule, the next appointment will be scheduled in 2-3 weeks, which will be via MyChart Video Visit. The next session will focus on working towards the established treatment goal.

## 2021-05-27 ENCOUNTER — Ambulatory Visit (INDEPENDENT_AMBULATORY_CARE_PROVIDER_SITE_OTHER): Payer: 59 | Admitting: Bariatrics

## 2021-05-27 ENCOUNTER — Other Ambulatory Visit: Payer: Self-pay

## 2021-05-27 VITALS — BP 158/88 | HR 72 | Temp 97.8°F | Ht 66.0 in | Wt 221.0 lb

## 2021-05-27 DIAGNOSIS — Z6837 Body mass index (BMI) 37.0-37.9, adult: Secondary | ICD-10-CM | POA: Diagnosis not present

## 2021-05-27 DIAGNOSIS — R7303 Prediabetes: Secondary | ICD-10-CM

## 2021-05-27 DIAGNOSIS — E559 Vitamin D deficiency, unspecified: Secondary | ICD-10-CM | POA: Diagnosis not present

## 2021-05-27 MED ORDER — VITAMIN D (ERGOCALCIFEROL) 1.25 MG (50000 UNIT) PO CAPS: 50000.0000 [IU] | ORAL_CAPSULE | ORAL | 0 refills | Status: DC

## 2021-05-27 NOTE — Progress Notes (Signed)
Chief Complaint:   OBESITY Sherry Marquez is here to discuss her progress with her obesity treatment plan along with follow-up of her obesity related diagnoses. Sherry Marquez is on the Category 3 Plan and states she is following her eating plan approximately 98% of the time. Sherry Marquez states she is doing 0 minutes 0 times per week.  Today's visit was #: 4 Starting weight: 232 lbs Starting date: 04/28/2021 Today's weight: 221 lbs Today's date: 05/27/2021 Total lbs lost to date: 11 lbs Total lbs lost since last in-office visit: 5 lbs  Interim History: Sherry Marquez is down 5 lbs since her last visit. She has more energy now. She is doing well with her water and protein intake.   Subjective:   1. Vitamin D insufficiency Sherry Marquez is currently taking Vitamin D.  2. Pre-diabetes Sherry Marquez is currently not on medications.   Assessment/Plan:   1. Vitamin D insufficiency Low Vitamin D level contributes to fatigue and are associated with obesity, breast, and colon cancer. We will refill prescription Vitamin D 50,000 IU every week for 1 month with no refills and Sherry Marquez will follow-up for routine testing of Vitamin D, at least 2-3 times per year to avoid over-replacement.  - Vitamin D, Ergocalciferol, (DRISDOL) 1.25 MG (50000 UNIT) CAPS capsule; Take 1 capsule (50,000 Units total) by mouth every 7 (seven) days.  Dispense: 4 capsule; Refill: 0  2. Pre-diabetes Sherry Marquez will continue to work on weight loss, and decreasing simple carbohydrates to help decrease the risk of diabetes. She will consider increasing activities and exercise.    3. Obesity, current BMI 35.8 Sherry Marquez is currently in the action stage of change. As such, her goal is to continue with weight loss efforts. She has agreed to keeping a food journal and adhering to recommended goals of 1500 calories and 90 grams of protein.   Sherry Marquez will continue meal planning and intentional eating.   Exercise goals:  Sherry Marquez will begin light weights and do Youtube videos.    Behavioral modification strategies: increasing lean protein intake, decreasing simple carbohydrates, increasing vegetables, increasing water intake, decreasing eating out, no skipping meals, meal planning and cooking strategies, keeping healthy foods in the home, and planning for success.  Sherry Marquez has agreed to follow-up with our clinic in 2 weeks. She was informed of the importance of frequent follow-up visits to maximize her success with intensive lifestyle modifications for her multiple health conditions.   Objective:   Blood pressure (!) 158/88, pulse 72, temperature 97.8 F (36.6 C), height 5' 6"  (1.676 m), weight 221 lb (100.2 kg), SpO2 98 %. Body mass index is 35.67 kg/m.  General: Cooperative, alert, well developed, in no acute distress. HEENT: Conjunctivae and lids unremarkable. Cardiovascular: Regular rhythm.  Lungs: Normal work of breathing. Neurologic: No focal deficits.   Lab Results  Component Value Date   CREATININE 0.93 02/24/2021   BUN 15 02/24/2021   NA 137 02/24/2021   K 4.4 02/24/2021   CL 102 02/24/2021   CO2 29 02/24/2021   Lab Results  Component Value Date   ALT 23 02/24/2021   AST 20 02/24/2021   ALKPHOS 74 02/24/2021   BILITOT 0.3 02/24/2021   Lab Results  Component Value Date   HGBA1C 5.7 02/24/2021   HGBA1C 5.7 10/29/2013   Lab Results  Component Value Date   INSULIN 12.2 04/28/2021   Lab Results  Component Value Date   TSH 2.340 04/28/2021   Lab Results  Component Value Date   CHOL 216 (H) 02/24/2021  HDL 48.80 02/24/2021   LDLCALC 140 (H) 02/24/2021   TRIG 138.0 02/24/2021   CHOLHDL 4 02/24/2021   Lab Results  Component Value Date   VD25OH 22.1 (L) 04/28/2021   Lab Results  Component Value Date   WBC 4.6 02/24/2021   HGB 15.0 02/24/2021   HCT 44.0 02/24/2021   MCV 89.8 02/24/2021   PLT 241.0 02/24/2021   Lab Results  Component Value Date   FERRITIN 58 09/05/2020   Attestation Statements:   Reviewed by clinician on  day of visit: allergies, medications, problem list, medical history, surgical history, family history, social history, and previous encounter notes.  I, Lizbeth Bark, RMA, am acting as Location manager for [employer], DO.   I have reviewed the above documentation for accuracy and completeness, and I agree with the above. Jearld Lesch, DO

## 2021-06-08 ENCOUNTER — Telehealth (INDEPENDENT_AMBULATORY_CARE_PROVIDER_SITE_OTHER): Payer: 59 | Admitting: Psychology

## 2021-06-08 DIAGNOSIS — Z634 Disappearance and death of family member: Secondary | ICD-10-CM

## 2021-06-08 DIAGNOSIS — F5089 Other specified eating disorder: Secondary | ICD-10-CM | POA: Diagnosis not present

## 2021-06-10 ENCOUNTER — Other Ambulatory Visit: Payer: Self-pay

## 2021-06-10 ENCOUNTER — Encounter (INDEPENDENT_AMBULATORY_CARE_PROVIDER_SITE_OTHER): Payer: Self-pay | Admitting: Bariatrics

## 2021-06-10 ENCOUNTER — Ambulatory Visit (INDEPENDENT_AMBULATORY_CARE_PROVIDER_SITE_OTHER): Payer: 59 | Admitting: Bariatrics

## 2021-06-10 VITALS — BP 135/83 | HR 66 | Temp 98.3°F | Ht 66.0 in | Wt 217.0 lb

## 2021-06-10 DIAGNOSIS — E559 Vitamin D deficiency, unspecified: Secondary | ICD-10-CM | POA: Diagnosis not present

## 2021-06-10 DIAGNOSIS — R7303 Prediabetes: Secondary | ICD-10-CM | POA: Diagnosis not present

## 2021-06-10 DIAGNOSIS — Z6837 Body mass index (BMI) 37.0-37.9, adult: Secondary | ICD-10-CM

## 2021-06-10 DIAGNOSIS — F5089 Other specified eating disorder: Secondary | ICD-10-CM | POA: Diagnosis not present

## 2021-06-10 MED ORDER — VITAMIN D (ERGOCALCIFEROL) 1.25 MG (50000 UNIT) PO CAPS: 50000.0000 [IU] | ORAL_CAPSULE | ORAL | 0 refills | Status: DC

## 2021-06-10 NOTE — Progress Notes (Signed)
Chief Complaint:   OBESITY Sherry Marquez is here to discuss her progress with her obesity treatment plan along with follow-up of her obesity related diagnoses. Sherry Marquez is on the Category 3 Plan and states she is following her eating plan approximately 90% of the time. Sherry Marquez states she is doing 0 minutes 0 times per week.  Today's visit was #: 5 Starting weight: 232 lbs Starting date: 04/28/2021 Today's weight: 217 lbs Today's date: 06/10/2021 Total lbs lost to date: 15 lbs Total lbs lost since last in-office visit: 4 lbs  Interim History: Sherry Marquez is down an additional 4 lbs. Her body is a point where she can't tell.  Subjective:   1. Vitamin D insufficiency [name] is taking her medication as directed.   2. Pre-diabetes [name] is not on medication currently.   3. Other disorder of eating Sherry Marquez sees Dr. Mallie Mussel our Bariatric Psychologist.  Assessment/Plan:   1. Vitamin D insufficiency Low Vitamin D level contributes to fatigue and are associated with obesity, breast, and colon cancer. We will refill prescription Vitamin D 50,000 IU every week for 1 month with no refills and Mychaela will follow-up for routine testing of Vitamin D, at least 2-3 times per year to avoid over-replacement.  - Vitamin D, Ergocalciferol, (DRISDOL) 1.25 MG (50000 UNIT) CAPS capsule; Take 1 capsule (50,000 Units total) by mouth every 7 (seven) days.  Dispense: 4 capsule; Refill: 0  2. Pre-diabetes Sherry Marquez will continue to work on weight loss, increase exercise, and decreasing simple carbohydrates to help decrease the risk of diabetes.   3. Other disorder of eating Sherry Marquez will continue to see Dr. Mallie Mussel. Behavior modification techniques were discussed today to help Sherry Marquez deal with her emotional/non-hunger eating behaviors.  Orders and follow up as documented in patient record.    4. Obesity, current BMI 35.1 [name] is currently in the action stage of change. As such, her goal is to continue with weight loss efforts. She  has agreed to the Category 3 Plan.   Sherry Marquez will continue meal planning and intentional eating. She will continue with the plan at 80-90%.  Exercise goals: No exercise has been prescribed at this time.  Behavioral modification strategies: increasing vegetables, increase H2O, ways to avoid nighttime snacking, better snacking choices, emotional eating strategies, planning for success.  Sherry Marquez has agreed to follow-up with our clinic in 2-3 weeks. She was informed of the importance of frequent follow-up visits to maximize her success with intensive lifestyle modifications for her multiple health conditions.   Objective:   Blood pressure 135/83, pulse 66, temperature 98.3 F (36.8 C), height 5' 6"  (1.676 m), weight 217 lb (98.4 kg), SpO2 98 %. Body mass index is 35.02 kg/m.  General: Cooperative, alert, well developed, in no acute distress. HEENT: Conjunctivae and lids unremarkable. Cardiovascular: Regular rhythm.  Lungs: Normal work of breathing. Neurologic: No focal deficits.   Lab Results  Component Value Date   CREATININE 0.93 02/24/2021   BUN 15 02/24/2021   NA 137 02/24/2021   K 4.4 02/24/2021   CL 102 02/24/2021   CO2 29 02/24/2021   Lab Results  Component Value Date   ALT 23 02/24/2021   AST 20 02/24/2021   ALKPHOS 74 02/24/2021   BILITOT 0.3 02/24/2021   Lab Results  Component Value Date   HGBA1C 5.7 02/24/2021   HGBA1C 5.7 10/29/2013   Lab Results  Component Value Date   INSULIN 12.2 04/28/2021   Lab Results  Component Value Date   TSH 2.340 04/28/2021  Lab Results  Component Value Date   CHOL 216 (H) 02/24/2021   HDL 48.80 02/24/2021   LDLCALC 140 (H) 02/24/2021   TRIG 138.0 02/24/2021   CHOLHDL 4 02/24/2021   Lab Results  Component Value Date   VD25OH 22.1 (L) 04/28/2021   Lab Results  Component Value Date   WBC 4.6 02/24/2021   HGB 15.0 02/24/2021   HCT 44.0 02/24/2021   MCV 89.8 02/24/2021   PLT 241.0 02/24/2021   Lab Results  Component  Value Date   FERRITIN 58 09/05/2020   Attestation Statements:   Reviewed by clinician on day of visit: allergies, medications, problem list, medical history, surgical history, family history, social history, and previous encounter notes.  I, Lizbeth Bark, RMA, am acting as Location manager for [employer], DO.   I have reviewed the above documentation for accuracy and completeness, and I agree with the above. Jearld Lesch, DO

## 2021-06-14 ENCOUNTER — Encounter (INDEPENDENT_AMBULATORY_CARE_PROVIDER_SITE_OTHER): Payer: Self-pay | Admitting: Bariatrics

## 2021-06-14 NOTE — Progress Notes (Signed)
  Office: [phone]  /  Fax: [phone]    Date: June 28, 2021   Appointment Start Time: 2:30pm Duration: 26 minutes Provider: Glennie Isle, Psy.D. Type of Session: Individual Therapy  Location of Patient: Home (private room) Location of Provider: Provider's Home (private office) Type of Contact: Telepsychological Visit via MyChart Video Visit  Session Content: Avonell is a 50 y.o. female presenting for a follow-up appointment to address the previously established treatment goal of increasing coping skills.Today's appointment was a telepsychological visit due to COVID-19. Fartun provided verbal consent for today's telepsychological appointment and she is aware she is responsible for securing confidentiality on her end of the session. Prior to proceeding with today's appointment, Deniesha's physical location at the time of this appointment was obtained as well a phone number she could be reached at in the event of technical difficulties. [name] and this provider participated in today's telepsychological service.   This provider conducted a brief check-in. Haneefah shared about recent events, including her son being sick. She indicated her eating went "well," but she did not have time to focus on self-care. It was recommended she continue to prioritize self-care. She agreed. Due to the upcoming holiday, vacation, and birthday, psychoeducation regarding making better choices and engaging in portion control was provided. More specifically, this provider discussed the following strategies: coming to meals hungry, but not starving; avoid filling up on appetizers; managing portion sizes; not completely depriving yourself; making the plate colorful (e.g., vegetables); pacing yourself (e.g., waiting 10 minutes before going back for seconds); taking advantage of the nutritious foods; practicing mindfulness; staying hydrated; and avoid bringing home leftovers. Overall, Bena was receptive to today's appointment  as evidenced by openness to sharing, responsiveness to feedback, and willingness to implement discussed strategies .  Mental Status Examination:  Appearance: well groomed and appropriate hygiene  Behavior: appropriate to circumstances Mood: euthymic Affect: mood congruent Speech: normal in rate, volume, and tone Eye Contact: appropriate Psychomotor Activity: appropriate Gait: unable to assess Thought Process: linear, logical, and goal directed  Thought Content/Perception: no hallucinations, delusions, bizarre thinking or behavior reported or observed and no evidence or endorsement of suicidal and homicidal ideation, plan, and intent Orientation: time, person, place, and purpose of appointment Memory/Concentration: memory, attention, language, and fund of knowledge intact  Insight/Judgment: good  Interventions:  Conducted a brief chart review Provided empathic reflections and validation Reviewed content from the previous session Employed supportive psychotherapy interventions to facilitate reduced distress and to improve coping skills with identified stressors Discussed strategies for holidays/vacations/celebrations   DSM-5 Diagnosis(es): F50.89 Other Specified Feeding or Eating Disorder, Emotional Eating Behaviors and S56.8 Uncomplicated Bereavement  Treatment Goal & Progress: During the initial appointment with this provider, the following treatment goal was established: increase coping skills. Chaniah has demonstrated some progress in her goal as evidenced by increased awareness of hunger patterns.   Plan: The next appointment will be scheduled in approximately two weeks, which will be via MyChart Video Visit. The next session will focus on working towards the established treatment goal.

## 2021-06-23 ENCOUNTER — Ambulatory Visit (INDEPENDENT_AMBULATORY_CARE_PROVIDER_SITE_OTHER): Payer: 59 | Admitting: Psychology

## 2021-06-23 DIAGNOSIS — F4321 Adjustment disorder with depressed mood: Secondary | ICD-10-CM | POA: Diagnosis not present

## 2021-06-24 ENCOUNTER — Encounter (INDEPENDENT_AMBULATORY_CARE_PROVIDER_SITE_OTHER): Payer: Self-pay | Admitting: Bariatrics

## 2021-06-24 ENCOUNTER — Ambulatory Visit (INDEPENDENT_AMBULATORY_CARE_PROVIDER_SITE_OTHER): Payer: 59 | Admitting: Bariatrics

## 2021-06-24 ENCOUNTER — Other Ambulatory Visit: Payer: Self-pay

## 2021-06-24 VITALS — BP 137/89 | HR 57 | Temp 98.0°F | Ht 66.0 in | Wt 216.0 lb

## 2021-06-24 DIAGNOSIS — R7303 Prediabetes: Secondary | ICD-10-CM | POA: Diagnosis not present

## 2021-06-24 DIAGNOSIS — E559 Vitamin D deficiency, unspecified: Secondary | ICD-10-CM

## 2021-06-24 DIAGNOSIS — Z6837 Body mass index (BMI) 37.0-37.9, adult: Secondary | ICD-10-CM

## 2021-06-24 NOTE — Progress Notes (Signed)
Chief Complaint:   OBESITY Sherry Marquez is here to discuss her progress with her obesity treatment plan along with follow-up of her obesity related diagnoses. Sherry Marquez is on the Category 3 Plan and states she is following her eating plan approximately 95% of the time. Sherry Marquez states she is doing 0 minutes 0 times per week.  Today's visit was #: 6 Starting weight: 232 lbs Starting date: 04/28/2021 Today's weight: 213 lbs Today's date: 06/24/2021 Total lbs lost to date: 19 lbs Total lbs lost since last in-office visit: 4 lbs  Interim History: Sherry Marquez is down 4 lbs since her last visit. She is doing well with her water and protein.  Subjective:   1. Pre-diabetes Sherry Marquez is currently not on medications.  2. Vitamin D insufficiency Sherry Marquez is taking Vitamin D currently.  Assessment/Plan:   1. Pre-diabetes Sherry Marquez will continue to cut carbohydrates (starchy and sweets). She will increase activities.  2. Vitamin D insufficiency Low Vitamin D level contributes to fatigue and are associated with obesity, breast, and colon cancer. Sherry Marquez agrees to continue to take prescription Vitamin D 50,000 IU every week and she will follow-up for routine testing of Vitamin D, at least 2-3 times per year to avoid over-replacement.  3. Obesity, current BMI 34.4 Sherry Marquez is currently in the action stage of change. As such, her goal is to continue with weight loss efforts. She has agreed to the Category 3 Plan.   Sherry Marquez will continue meal planning and she will continue intentional eating. Salad handout.   Exercise goals: No exercise has been prescribed at this time.  Behavioral modification strategies: increasing lean protein intake, decreasing simple carbohydrates, increasing vegetables, increasing water intake, decreasing eating out, no skipping meals, meal planning and cooking strategies, keeping healthy foods in the home, and planning for success.  Sherry Marquez has agreed to follow-up with our clinic in 5 weeks. She was  informed of the importance of frequent follow-up visits to maximize her success with intensive lifestyle modifications for her multiple health conditions.   Objective:   Blood pressure 137/89, pulse (!) 57, temperature 98 F (36.7 C), height 5' 6"  (1.676 m), weight 216 lb (98 kg), SpO2 98 %. Body mass index is 34.86 kg/m.  General: Cooperative, alert, well developed, in no acute distress. HEENT: Conjunctivae and lids unremarkable. Cardiovascular: Regular rhythm.  Lungs: Normal work of breathing. Neurologic: No focal deficits.   Lab Results  Component Value Date   CREATININE 0.93 02/24/2021   BUN 15 02/24/2021   NA 137 02/24/2021   K 4.4 02/24/2021   CL 102 02/24/2021   CO2 29 02/24/2021   Lab Results  Component Value Date   ALT 23 02/24/2021   AST 20 02/24/2021   ALKPHOS 74 02/24/2021   BILITOT 0.3 02/24/2021   Lab Results  Component Value Date   HGBA1C 5.7 02/24/2021   HGBA1C 5.7 10/29/2013   Lab Results  Component Value Date   INSULIN 12.2 04/28/2021   Lab Results  Component Value Date   TSH 2.340 04/28/2021   Lab Results  Component Value Date   CHOL 216 (H) 02/24/2021   HDL 48.80 02/24/2021   LDLCALC 140 (H) 02/24/2021   TRIG 138.0 02/24/2021   CHOLHDL 4 02/24/2021   Lab Results  Component Value Date   VD25OH 22.1 (L) 04/28/2021   Lab Results  Component Value Date   WBC 4.6 02/24/2021   HGB 15.0 02/24/2021   HCT 44.0 02/24/2021   MCV 89.8 02/24/2021   PLT 241.0 02/24/2021  Lab Results  Component Value Date   FERRITIN 58 09/05/2020   Attestation Statements:   Reviewed by clinician on day of visit: allergies, medications, problem list, medical history, surgical history, family history, social history, and previous encounter notes.  I, Lizbeth Bark, RMA, am acting as Location manager for [employer], DO.   I have reviewed the above documentation for accuracy and completeness, and I agree with the above. -

## 2021-06-28 ENCOUNTER — Telehealth (INDEPENDENT_AMBULATORY_CARE_PROVIDER_SITE_OTHER): Payer: 59 | Admitting: Psychology

## 2021-06-28 DIAGNOSIS — Z634 Disappearance and death of family member: Secondary | ICD-10-CM | POA: Diagnosis not present

## 2021-06-28 DIAGNOSIS — F5089 Other specified eating disorder: Secondary | ICD-10-CM

## 2021-06-29 NOTE — Progress Notes (Signed)
  Office: [phone]  /  Fax: [phone]    Date: July 13, 2021   Appointment Start Time: 1:59pm Duration: 31 minutes Provider: Glennie Isle, Psy.D. Type of Session: Individual Therapy  Location of Patient: Home (private room) Location of Provider: Provider's Home (private office) Type of Contact: Telepsychological Visit via MyChart Video Visit  Session Content: Sherry Marquez is a 50 y.o. female presenting for a follow-up appointment to address the previously established treatment goal of increasing coping skills.Today's appointment was a telepsychological visit due to COVID-19. Sherry Marquez provided verbal consent for today's telepsychological appointment and she is aware she is responsible for securing confidentiality on her end of the session. Prior to proceeding with today's appointment, Sherry Marquez's physical location at the time of this appointment was obtained as well a phone number she could be reached at in the event of technical difficulties. [name] and this provider participated in today's telepsychological service. Of note, today's appointment was switched to a regular telephone call at 2:12pm with [name]'s verbal consent due to technical issues.   This provider conducted a brief check-in. Sherry Marquez shared Thanksgiving went "well." This provider and Sherry Marquez discussed what went well (e.g., taking high protein snacks) and what she would have liked to have done differently. She acknowledged having more sweet cravings the week after the holiday and her birthday. Sherry Marquez expressed concern about cookies during Christmas. This was further explored and processed. She was also engaged in problem solving to avoid/limit deviations from her structured meal plan during the upcoming holiday. Furthermore, termination planning was discussed. Sherry Marquez was receptive to a follow-up appointment in 3-4 weeks and an additional follow-up/termination appointment in 3-4 weeks after that. Overall, Sherry Marquez was receptive to today's appointment  as evidenced by openness to sharing, responsiveness to feedback, and willingness to implement discussed strategies .  Mental Status Examination:  Appearance: well groomed and appropriate hygiene  Behavior: appropriate to circumstances Mood: euthymic Affect: mood congruent Speech: WNL Eye Contact: appropriate Psychomotor Activity: WNL Gait: unable to assess Thought Process: linear, logical, and goal directed and no evidence or endorsement of suicidal, homicidal, and self-harm ideation, plan and intent  Thought Content/Perception: no hallucinations, delusions, bizarre thinking or behavior endorsed or observed Orientation: AAOx4 Memory/Concentration: memory, attention, language, and fund of knowledge intact  Insight/Judgment: good  Interventions:  Conducted a brief chart review Provided empathic reflections and validation Employed supportive psychotherapy interventions to facilitate reduced distress and to improve coping skills with identified stressors Engaged patient in problem solving Discussed termination planning  DSM-5 Diagnosis(es):  F50.89 Other Specified Feeding or Eating Disorder, Emotional Eating Behaviors and Q76.1 Uncomplicated Bereavement  Treatment Goal & Progress: During the initial appointment with this provider, the following treatment goal was established: increase coping skills. Sherry Marquez has demonstrated progress in her goal as evidenced by increased awareness of hunger patterns, increased awareness of triggers for emotional eating behaviors, and reduction in emotional eating behaviors . Sherry Marquez also continues to demonstrate willingness to engage in learned skill(s).  Plan: The next appointment will be scheduled in 3-4 weeks, which will be via MyChart Video Visit. The next session will focus on working towards the established treatment goal. Sherry Marquez will continue meeting with her primary therapist.

## 2021-07-05 ENCOUNTER — Other Ambulatory Visit: Payer: Self-pay

## 2021-07-05 ENCOUNTER — Encounter (INDEPENDENT_AMBULATORY_CARE_PROVIDER_SITE_OTHER): Payer: Self-pay | Admitting: Bariatrics

## 2021-07-05 ENCOUNTER — Ambulatory Visit (INDEPENDENT_AMBULATORY_CARE_PROVIDER_SITE_OTHER): Payer: 59 | Admitting: Bariatrics

## 2021-07-05 VITALS — BP 140/84 | HR 60 | Temp 98.5°F | Ht 66.0 in | Wt 213.0 lb

## 2021-07-05 DIAGNOSIS — R7303 Prediabetes: Secondary | ICD-10-CM | POA: Diagnosis not present

## 2021-07-05 DIAGNOSIS — E559 Vitamin D deficiency, unspecified: Secondary | ICD-10-CM | POA: Diagnosis not present

## 2021-07-05 DIAGNOSIS — Z6837 Body mass index (BMI) 37.0-37.9, adult: Secondary | ICD-10-CM

## 2021-07-05 MED ORDER — VITAMIN D (ERGOCALCIFEROL) 1.25 MG (50000 UNIT) PO CAPS: 50000.0000 [IU] | ORAL_CAPSULE | ORAL | 0 refills | Status: DC

## 2021-07-05 NOTE — Progress Notes (Signed)
Chief Complaint:   OBESITY Sherry Marquez is here to discuss her progress with her obesity treatment plan along with follow-up of her obesity related diagnoses. Sherry Marquez is on the Category 3 Plan and states she is following her eating plan approximately 75% of the time. Sherry Marquez states she is doing 0 minutes 0 times per week.  Today's visit was #: 7 Starting weight: 232 lbs Starting date: 04/28/2021 Today's weight: 213 lbs Today's date: 07/05/2021 Total lbs lost to date: 19 lbs Total lbs lost since last in-office visit: 3 lbs  Interim History: Sherry Marquez is down 3 lbs since her last visit and doing well overall. She is drinking adequate amount of water.  Subjective:   1. Vitamin D insufficiency Sherry Marquez is taking high dose Vitamin D. Her energy has improved.  2. Pre-diabetes Sherry Marquez is currently not on medications.  Assessment/Plan:   1. Vitamin D insufficiency Low Vitamin D level contributes to fatigue and are associated with obesity, breast, and colon cancer. We will refill prescription Vitamin D 50,000 IU every week for 1 month with no refills and Sherry Marquez will follow-up for routine testing of Vitamin D, at least 2-3 times per year to avoid over-replacement.  - Vitamin D, Ergocalciferol, (DRISDOL) 1.25 MG (50000 UNIT) CAPS capsule; Take 1 capsule (50,000 Units total) by mouth every 7 (seven) days.  Dispense: 4 capsule; Refill: 0  2. Pre-diabetes Sherry Marquez will continue to work on weight loss, exercise, and decreasing simple carbohydrates to help decrease the risk of diabetes. She will increase healthy fats and protein.  3. Obesity, current BMI 34.4 Sherry Marquez is currently in the action stage of change. As such, her goal is to continue with weight loss efforts. She has agreed to the Category 3 Plan.   Sherry Marquez will continue meal planning. She will continue intentional eating. She will track her protein and water.  Exercise goals:  Sherry Marquez will increase lifting weights and resistance. She will do occasionally  walking.  Behavioral modification strategies: increasing lean protein intake, decreasing simple carbohydrates, increasing vegetables, increasing water intake, decreasing eating out, no skipping meals, meal planning and cooking strategies, keeping healthy foods in the home, and planning for success.  Sherry Marquez has agreed to follow-up with our clinic in 3 weeks with Sherry Bathe, FNP or Sherry Marble, NP and 6 weeks with myself (fasting). She was informed of the importance of frequent follow-up visits to maximize her success with intensive lifestyle modifications for her multiple health conditions.   Objective:   Blood pressure 140/84, pulse 60, temperature 98.5 F (36.9 C), height 5' 6"  (1.676 m), weight 213 lb (96.6 kg), SpO2 98 %. Body mass index is 34.38 kg/m.  General: Cooperative, alert, well developed, in no acute distress. HEENT: Conjunctivae and lids unremarkable. Cardiovascular: Regular rhythm.  Lungs: Normal work of breathing. Neurologic: No focal deficits.   Lab Results  Component Value Date   CREATININE 0.93 02/24/2021   BUN 15 02/24/2021   NA 137 02/24/2021   K 4.4 02/24/2021   CL 102 02/24/2021   CO2 29 02/24/2021   Lab Results  Component Value Date   ALT 23 02/24/2021   AST 20 02/24/2021   ALKPHOS 74 02/24/2021   BILITOT 0.3 02/24/2021   Lab Results  Component Value Date   HGBA1C 5.7 02/24/2021   HGBA1C 5.7 10/29/2013   Lab Results  Component Value Date   INSULIN 12.2 04/28/2021   Lab Results  Component Value Date   TSH 2.340 04/28/2021   Lab Results  Component Value  Date   CHOL 216 (H) 02/24/2021   HDL 48.80 02/24/2021   LDLCALC 140 (H) 02/24/2021   TRIG 138.0 02/24/2021   CHOLHDL 4 02/24/2021   Lab Results  Component Value Date   VD25OH 22.1 (L) 04/28/2021   Lab Results  Component Value Date   WBC 4.6 02/24/2021   HGB 15.0 02/24/2021   HCT 44.0 02/24/2021   MCV 89.8 02/24/2021   PLT 241.0 02/24/2021   Lab Results  Component Value Date    FERRITIN 58 09/05/2020   Attestation Statements:   Reviewed by clinician on day of visit: allergies, medications, problem list, medical history, surgical history, family history, social history, and previous encounter notes.  I, Lizbeth Bark, RMA, am acting as Location manager for [employer], DO.  I have reviewed the above documentation for accuracy and completeness, and I agree with the above. Jearld Lesch, DO

## 2021-07-07 ENCOUNTER — Ambulatory Visit: Payer: 59 | Admitting: Psychology

## 2021-07-13 ENCOUNTER — Telehealth (INDEPENDENT_AMBULATORY_CARE_PROVIDER_SITE_OTHER): Payer: 59 | Admitting: Psychology

## 2021-07-13 DIAGNOSIS — F5089 Other specified eating disorder: Secondary | ICD-10-CM | POA: Diagnosis not present

## 2021-07-13 DIAGNOSIS — Z634 Disappearance and death of family member: Secondary | ICD-10-CM | POA: Diagnosis not present

## 2021-07-16 ENCOUNTER — Ambulatory Visit: Payer: 59 | Admitting: Psychology

## 2021-07-16 DIAGNOSIS — F4321 Adjustment disorder with depressed mood: Secondary | ICD-10-CM

## 2021-07-16 NOTE — Progress Notes (Signed)
[facility] Counselor/Therapist Progress Note  Patient ID: Sherry Marquez, MRN: [id number],    Date: 07/16/2021  Time Spent: 60 minutes   Treatment Type: Individual Therapy  Reported Symptoms: stress  Mental Status Exam: Appearance:  Neat     Behavior: Appropriate  Motor: Normal  Speech/Language:  Normal Rate  Affect: Appropriate  Mood: normal  Thought process: normal  Thought content:   WNL  Sensory/Perceptual disturbances:   WNL  Orientation: oriented to person, place, and time/date  Attention: Good  Concentration: Good  Memory: WNL  Fund of knowledge:  Good  Insight:   Good  Judgment:  Good  Impulse Control: Good   Risk Assessment: Danger to Self:  No Self-injurious Behavior: No Danger to Others: No Duty to Warn:no Physical Aggression / Violence:No  Access to Firearms a concern: No  Gang Involvement:No   Subjective:   Pt present for face-to-face individual therapy via video Webex.  Pt consents to telehealth video session due to COVID 19 pandemic. Location of pt: home  Location of therapist: home office.   Pt talked about Thanksgiving.  She states she had a good time with family.  She allowed herself to have sweets a couple of days but then got back on track with her diet so she feels proud of herself.    Pt states she has been focusing a lot on doing Christmas crafts so she has distracted herself from grief.   Pt talked about her husband's issues with handling his family's estate and the death of his parents and brother.  Pt feels affected by her husband's feelings and reactions.  Pt states her husband is a big personality and is assertive.  Pt has trouble with assertiveness and gets anxious about conflict.   Pt feels anxious when she hears her husband handling conflict and being assertive.   Worked with pt on calming strategies.  Helped pt with boundary setting and reframing internal dialogue. Provided supportive therapy.    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  (Treatment Plan target date: 06/27/2022)  Pt participated in setting treatment goals.  Pt wants a safe place to talk and to improve coping skills.  Pt would like to work on figuring out her own opinions and to communicate them.  Pt would like to increase confidence and self esteem.  Pt wants to learn to be assertive.   Plan to meet every two weeks.    Achille Xiang, LCSW

## 2021-07-22 ENCOUNTER — Ambulatory Visit: Payer: Commercial Managed Care - HMO | Admitting: Family Medicine

## 2021-07-22 ENCOUNTER — Telehealth: Payer: Self-pay | Admitting: Family Medicine

## 2021-07-22 ENCOUNTER — Encounter: Payer: Self-pay | Admitting: Family Medicine

## 2021-07-22 ENCOUNTER — Telehealth (INDEPENDENT_AMBULATORY_CARE_PROVIDER_SITE_OTHER): Payer: Commercial Managed Care - HMO | Admitting: Family Medicine

## 2021-07-22 VITALS — Temp 101.1°F

## 2021-07-22 DIAGNOSIS — Z86718 Personal history of other venous thrombosis and embolism: Secondary | ICD-10-CM

## 2021-07-22 DIAGNOSIS — U071 COVID-19: Secondary | ICD-10-CM

## 2021-07-22 DIAGNOSIS — J020 Streptococcal pharyngitis: Secondary | ICD-10-CM

## 2021-07-22 DIAGNOSIS — R6889 Other general symptoms and signs: Secondary | ICD-10-CM

## 2021-07-22 LAB — POCT INFLUENZA A/B
Influenza A, POC: NEGATIVE
Influenza B, POC: NEGATIVE

## 2021-07-22 LAB — POCT RAPID STREP A (OFFICE): Rapid Strep A Screen: POSITIVE — AB

## 2021-07-22 LAB — POC COVID19 BINAXNOW: SARS Coronavirus 2 Ag: POSITIVE — AB

## 2021-07-22 MED ORDER — AZITHROMYCIN 250 MG PO TABS
ORAL_TABLET | ORAL | 0 refills | Status: AC
Start: 2021-07-22 — End: 2021-07-27

## 2021-07-22 MED ORDER — MOLNUPIRAVIR EUA 200MG CAPSULE: 4.0000 | ORAL_CAPSULE | Freq: Two times a day (BID) | ORAL | 0 refills | Status: AC

## 2021-07-22 NOTE — Progress Notes (Signed)
Virtual Visit via Video Note  I connected with Sherry Marquez on 07/22/21 at  4:00 PM EST by a video enabled telemedicine application 2/2 [id number]-[id number] pandemic and verified that I am speaking with the correct person using two identifiers.  Location patient: home Location provider:work or home office Persons participating in the virtual visit: patient, provider  I discussed the limitations of evaluation and management by telemedicine and the availability of in person appointments. The patient expressed understanding and agreed to proceed.   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 Ethlyn Gallery and seen for acute concern.  Pt felt increased fatigue on Tuesday 12/13.  Developed sore throat with white patches, cough, congestion, rhinorrhea, mild ear discomfort, and Tmax 101 F on Wednesday 12/14.  Denies n/v, diarrhea.  Pt's kids have been sick with cold-like symptoms that lasted 3 days.  Tried budesonide nasal spray.  Pt had COVID in January 8119 which was complicated by DVT and PE.    ROS: See pertinent positives and negatives per HPI.  Past Medical History:  Diagnosis Date   Allergy    Anxiety    Back pain    COVID    Diabetes ([place])    Eczema    Environmental and seasonal allergies    High cholesterol    History of gestational diabetes    Hx of blood clots    Joint pain    Lack of energy    LUMBAR SPRAIN AND STRAIN 12/05/2009   Qualifier: Diagnosis of  By: Sarajane Jews MD, Ishmael Holter    Obesity    Osteoarthritis    Pneumonia    Right ovarian cyst    Rosacea    SOB (shortness of breath)     Past Surgical History:  Procedure Laterality Date   CESAREAN SECTION  03-15-2005   dr Phineas Real  @[facility]    LAPAROSCOPY N/A 05/20/2019   Procedure: LAPAROSCOPY DIAGNOSTIC;  Surgeon: Anastasio Auerbach, MD;  Location: [facility];  Service: Gynecology;   Laterality: N/A;   WISDOM TOOTH EXTRACTION  2001    Family History  Problem Relation Age of Onset   Atrial fibrillation Mother    Hypothyroidism Mother    Hypertension Mother    Heart disease Mother    Sleep apnea Mother    Obesity Mother    Heart disease Father        defibrillator   Hypertension Father    Heart attack Father 56   CAD Father        4-way bypass   High Cholesterol Father    Sleep apnea Father    Obesity Father    Hypertension Brother    Lymphoma Maternal Grandmother 25   Congestive Heart Failure Maternal Grandmother    Stroke Maternal Grandfather 67   Cancer Paternal Grandmother        mets to lung   Heart attack Paternal Grandfather        early death     Current Outpatient Medications:    cetirizine (ZYRTEC) 10 MG tablet, Take 10 mg by mouth daily., Disp: , Rfl:    ibuprofen (ADVIL) 400 MG tablet, Take 400 mg by mouth every 6 (six) hours as needed., Disp: , Rfl:    POTASSIUM CHLORIDE PO, Take 640 mg by mouth., Disp: , Rfl:    Vitamin D, Ergocalciferol, (DRISDOL) 1.25 MG (50000 UNIT) CAPS capsule, Take 1  capsule (50,000 Units total) by mouth every 7 (seven) days., Disp: 4 capsule, Rfl: 0  EXAM:  VITALS per patient if applicable:  pO2 77%,  Temp 99 F, RR between 12-20 bpm  GENERAL: alert, oriented, appears well, mildly fatigued, and in no acute distress  HEENT: atraumatic, conjunctiva clear, no obvious abnormalities on inspection of external nose and ears  NECK: normal movements of the head and neck  LUNGS: on inspection no signs of respiratory distress, breathing rate appears normal, no obvious gross SOB, gasping or wheezing  CV: no obvious cyanosis  MS: moves all visible extremities without noticeable abnormality  PSYCH/NEURO: pleasant and cooperative, no obvious depression or anxiety, speech and thought processing grossly intact  ASSESSMENT AND PLAN:  Discussed the following assessment and plan:  COVID-19 virus infection  -Positive POC  test today in clinic -History of prior COVID-19 infection January 2022 requiring hospitalization for DVT and PE -Discussed r/b/a of antiviral medications for current symptoms mild.  Patient wishes to start medication -Continue supportive care including rest, hydration, Tylenol, OTC cough/cold medications as needed -Given strict precautions - Plan: molnupiravir EUA (LAGEVRIO) 200 mg CAPS capsule  Flu-like symptoms  -POC COVID-19 and rapid strep test positive. - Plan: POC COVID-19, POC Influenza A/B, POC Rapid Strep A  Strep throat -Positive POC test today in clinic -Allergy to Cefotetan -Start azithromycin -Continue supportive care including gargling with warm salt water or Chloraseptic spray, drinking warm fluids, Tylenol as needed -Patient advised to change toothbrush - Plan: azithromycin (ZITHROMAX) 250 MG tablet  History of DVT -a complication of prior OEUMP-53 virus infection in January 2022. -Not currently on anticoagulation therapy -Continue to monitor -Given strict precautions  Follow-up as needed   I discussed the assessment and treatment plan with the patient. The patient was provided an opportunity to ask questions and all were answered. The patient agreed with the plan and demonstrated an understanding of the instructions.   The patient was advised to call back or seek an in-person evaluation if the symptoms worsen or if the condition fails to improve as anticipated.  Billie Ruddy, MD

## 2021-07-26 ENCOUNTER — Other Ambulatory Visit: Payer: Self-pay

## 2021-07-26 ENCOUNTER — Encounter (INDEPENDENT_AMBULATORY_CARE_PROVIDER_SITE_OTHER): Payer: Self-pay | Admitting: Bariatrics

## 2021-07-26 ENCOUNTER — Telehealth (INDEPENDENT_AMBULATORY_CARE_PROVIDER_SITE_OTHER): Payer: 59 | Admitting: Bariatrics

## 2021-07-26 DIAGNOSIS — E559 Vitamin D deficiency, unspecified: Secondary | ICD-10-CM

## 2021-07-26 DIAGNOSIS — E66811 Obesity, class 1: Secondary | ICD-10-CM

## 2021-07-26 DIAGNOSIS — R7303 Prediabetes: Secondary | ICD-10-CM | POA: Diagnosis not present

## 2021-07-26 DIAGNOSIS — Z6834 Body mass index (BMI) 34.0-34.9, adult: Secondary | ICD-10-CM

## 2021-07-26 MED ORDER — VITAMIN D (ERGOCALCIFEROL) 1.25 MG (50000 UNIT) PO CAPS: 50000.0000 [IU] | ORAL_CAPSULE | ORAL | 0 refills | Status: DC

## 2021-07-26 NOTE — Progress Notes (Signed)
TeleHealth Visit:  Due to the COVID-19 pandemic, this visit was completed with telemedicine (audio/video) technology to reduce patient and provider exposure as well as to preserve personal protective equipment.   Sherry Marquez has verbally consented to this TeleHealth visit. The patient is located at home, the provider is located at the [facility] office. The participants in this visit include the listed provider and patient. The visit was conducted today via video.  Chief Complaint: OBESITY Sherry Marquez is here to discuss her progress with her obesity treatment plan along with follow-up of her obesity related diagnoses. Sherry Marquez is on the Category 3 Plan and states she is following her eating plan approximately 85% of the time. Sherry Marquez states she is doing 0 minutes 0 times per week.  Today's visit was #: 8 Starting weight: 232 lbs Starting date: 04/28/2021  Interim History: Sherry Marquez think that she has hit a plateau but has done well overall. She is drinking adequate water. She is talking to Dr. Mallie Mussel out Bariatric Psychologist.   Subjective:   1. Vitamin D deficiency Sherry Marquez is currently taking Vitamin D.  2. Pre-diabetes Sherry Marquez is not on medications currently.   Assessment/Plan:   1. Vitamin D deficiency Low Vitamin D level contributes to fatigue and are associated with obesity, breast, and colon cancer. We will refill prescription Vitamin D 50,000 IU every week for 1 month with no refills and Emmanuela will follow-up for routine testing of Vitamin D, at least 2-3 times per year to avoid over-replacement.  - Vitamin D, Ergocalciferol, (DRISDOL) 1.25 MG (50000 UNIT) CAPS capsule; Take 1 capsule (50,000 Units total) by mouth every 7 (seven) days.  Dispense: 4 capsule; Refill: 0  2. Pre-diabetes Sherry Marquez will continue to work on weight loss, exercise, and decreasing simple carbohydrates to help decrease the risk of diabetes. She will increase healthy fats and protein.  3. Obesity, current BMI  34.38 Sherry Marquez is currently in the action stage of change. As such, her goal is to continue with weight loss efforts. She has agreed to the Category 3 Plan.   Sherry Marquez will continue meal planning and she will continue intentional eating.  Exercise goals:  Sherry Marquez will start weight lifting videos and cardio 3 times per week.   Behavioral modification strategies: increasing lean protein intake, decreasing simple carbohydrates, increasing vegetables, increasing water intake, decreasing eating out, no skipping meals, meal planning and cooking strategies, keeping healthy foods in the home, and planning for success.  Sherry Marquez has agreed to follow-up with our clinic in 3-4 weeks. She was informed of the importance of frequent follow-up visits to maximize her success with intensive lifestyle modifications for her multiple health conditions.  Objective:   VITALS: Per patient if applicable, see vitals. GENERAL: Alert and in no acute distress. CARDIOPULMONARY: No increased WOB. Speaking in clear sentences.  PSYCH: Pleasant and cooperative. Speech normal rate and rhythm. Affect is appropriate. Insight and judgement are appropriate. Attention is focused, linear, and appropriate.  NEURO: Oriented as arrived to appointment on time with no prompting.   Lab Results  Component Value Date   CREATININE 0.93 02/24/2021   BUN 15 02/24/2021   NA 137 02/24/2021   K 4.4 02/24/2021   CL 102 02/24/2021   CO2 29 02/24/2021   Lab Results  Component Value Date   ALT 23 02/24/2021   AST 20 02/24/2021   ALKPHOS 74 02/24/2021   BILITOT 0.3 02/24/2021   Lab Results  Component Value Date   HGBA1C 5.7 02/24/2021   HGBA1C  5.7 10/29/2013   Lab Results  Component Value Date   INSULIN 12.2 04/28/2021   Lab Results  Component Value Date   TSH 2.340 04/28/2021   Lab Results  Component Value Date   CHOL 216 (H) 02/24/2021   HDL 48.80 02/24/2021   LDLCALC 140 (H) 02/24/2021   TRIG 138.0 02/24/2021   CHOLHDL 4  02/24/2021   Lab Results  Component Value Date   VD25OH 22.1 (L) 04/28/2021   Lab Results  Component Value Date   WBC 4.6 02/24/2021   HGB 15.0 02/24/2021   HCT 44.0 02/24/2021   MCV 89.8 02/24/2021   PLT 241.0 02/24/2021   Lab Results  Component Value Date   FERRITIN 58 09/05/2020    Attestation Statements:   Reviewed by clinician on day of visit: allergies, medications, problem list, medical history, surgical history, family history, social history, and previous encounter notes.  I, Lizbeth Bark, RMA, am acting as Location manager for [employer], DO.  I have reviewed the above documentation for accuracy and completeness, and I agree with the above. -

## 2021-07-27 NOTE — Progress Notes (Signed)
°  Office: [phone]  /  Fax: [phone]    Date: August 10, 2021 Appointment Start Time: 2:00pm Duration: 30 minutes Provider: Glennie Isle, Psy.D. Type of Session: Individual Therapy  Location of Patient: Home (private location) Location of Provider: Provider's Home (private office) Type of Contact: Telepsychological Visit via MyChart Video Visit  Session Content: Sherry Marquez is a 50 y.o. female presenting for a follow-up appointment to address the previously established treatment goal of increasing coping skills.Today's appointment was a telepsychological visit due to COVID-19. Sofi provided verbal consent for today's telepsychological appointment and she is aware she is responsible for securing confidentiality on her end of the session. Prior to proceeding with today's appointment, [name]'s physical location at the time of this appointment was obtained as well a phone number she could be reached at in the event of technical difficulties. [name] and this provider participated in today's telepsychological service.   This provider conducted a brief check-in. [name] reported she was sick prior to the holidays, which she feels impacted her eating habits. Explored further and processed. She acknowledged moments of "beating" herself up when deviating. Psychoeducation provided regarding self-compassion. [name] was engaged in a self-compassion exercise to help with eating-related challenges and other ongoing stressors. She was encouraged to regularly ask herself, What do I need right now? Overall, [name] was receptive to today's appointment as evidenced by openness to sharing, responsiveness to feedback, and  willingness to work toward increasing self-compassion .  Mental Status Examination:  Appearance: well groomed and appropriate hygiene  Behavior: appropriate to circumstances Mood: neutral Affect: mood congruent Speech: WNL Eye Contact: appropriate Psychomotor Activity: WNL Gait: unable to  assess Thought Process: linear, logical, and goal directed and no evidence or endorsement of suicidal, homicidal, and self-harm ideation, plan and intent  Thought Content/Perception: no hallucinations, delusions, bizarre thinking or behavior endorsed or observed Orientation: AAOx4 Memory/Concentration: memory, attention, language, and fund of knowledge intact  Insight/Judgment: good  Interventions:  Conducted a brief chart review Provided empathic reflections and validation Employed supportive psychotherapy interventions to facilitate reduced distress and to improve coping skills with identified stressors Psychoeducation provided regarding self-compassion Engaged pt in a self-compassion exercise  DSM-5 Diagnosis(es):  F50.89 Other Specified Feeding or Eating Disorder, Emotional Eating Behaviors and G81.1 Uncomplicated Bereavement  Treatment Goal & Progress: During the initial appointment with this provider, the following treatment goal was established: increase coping skills. [name] has demonstrated progress in her goal as evidenced by increased awareness of hunger patterns, increased awareness of triggers for emotional eating behaviors, and reduction in emotional eating behaviors . [name] also continues to demonstrate willingness to engage in learned skill(s).  Plan: The next appointment will be scheduled in 3-4 weeks, which will be via MyChart Video Visit. The next session will focus on working towards the established treatment goal and possible termination.

## 2021-07-29 NOTE — Telephone Encounter (Signed)
Error/njr

## 2021-08-10 ENCOUNTER — Telehealth (INDEPENDENT_AMBULATORY_CARE_PROVIDER_SITE_OTHER): Payer: 59 | Admitting: Psychology

## 2021-08-10 DIAGNOSIS — F5089 Other specified eating disorder: Secondary | ICD-10-CM | POA: Diagnosis not present

## 2021-08-10 DIAGNOSIS — Z634 Disappearance and death of family member: Secondary | ICD-10-CM

## 2021-08-17 ENCOUNTER — Ambulatory Visit: Payer: 59 | Admitting: Psychology

## 2021-08-17 DIAGNOSIS — F4321 Adjustment disorder with depressed mood: Secondary | ICD-10-CM | POA: Diagnosis not present

## 2021-08-17 NOTE — Progress Notes (Signed)
[facility] Counselor/Therapist Progress Note  Patient ID: Sherry Marquez, MRN: [id number],    Date: 08/17/2021  Time Spent: 11:00am-12:00pm    60 minutes   Treatment Type: Individual Therapy  Reported Symptoms: stress  Mental Status Exam: Appearance:  Neat     Behavior: Appropriate  Motor: Normal  Speech/Language:  Normal Rate  Affect: Appropriate  Mood: normal  Thought process: normal  Thought content:   WNL  Sensory/Perceptual disturbances:   WNL  Orientation: oriented to person, place, and time/date  Attention: Good  Concentration: Good  Memory: WNL  Fund of knowledge:  Good  Insight:   Good  Judgment:  Good  Impulse Control: Good   Risk Assessment: Danger to Self:  No Self-injurious Behavior: No Danger to Others: No Duty to Warn:no Physical Aggression / Violence:No  Access to Firearms a concern: No  Gang Involvement:No   Subjective:   Pt present for face-to-face individual therapy via video Webex.  Pt consents to telehealth video session due to COVID 19 pandemic. Location of pt: home  Location of therapist: home office.   Pt talked about her Christmas.  She states it was somewhat quiet and was good.  Pt had COVID the week before Christmas but was recovered for Christmas day.  Pt did feel anxious about having COVID bc she was hospitalized with COVID last year and both of her in laws died of COVID last year.  Pt states that she has been working on getting her house back in order.  She is preparing for getting a boston terrier puppy in February.  She is excited about getting the puppy and hasn't felt this level of excitement in a long time.   Pt picked a word for this year and chose the word "reclaim".   She feels like last year took so much from her.  She wants to reclaim joy, reclaim her health, reclaim her home.   Pt talked about setting a couple of goals.   She wants to work on communication when there is conflict.   Pt also wants to figure out her  own opinions.  Pt does not give herself permission to form opinions bc she is so concerned about pleasing everyone else.  Worked with pt on how she can give herself permission to form her own opinions.  Encouraged pt to journal about her thoughts and opinions.    Pt's husband gets anxious about the safety and security of her and the kids.  This impacts their life bc he feels he has to keep the family close and safe.  Pt has some differing opinions about this issue but hasn't felt comfortable communicating them with her husband.  Addressed the barriers to pt's sharing of her opinions.   Helped pt with boundary setting and reframing internal dialogue. Provided supportive therapy.    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  (Treatment Plan target date: 06/27/2022)  Pt is progressing toward treatment goals.   Pt wants a safe place to talk and to improve coping skills.  Pt would like to work on figuring out her own opinions and to communicate them.  Pt would like to increase confidence and self esteem.  Pt wants to learn to be assertive.    Plan to continue to meet every two weeks.    Shelisha Gautier, LCSW

## 2021-08-23 ENCOUNTER — Encounter (INDEPENDENT_AMBULATORY_CARE_PROVIDER_SITE_OTHER): Payer: Self-pay | Admitting: Bariatrics

## 2021-08-23 ENCOUNTER — Ambulatory Visit (INDEPENDENT_AMBULATORY_CARE_PROVIDER_SITE_OTHER): Payer: 59 | Admitting: Bariatrics

## 2021-08-23 ENCOUNTER — Other Ambulatory Visit: Payer: Self-pay

## 2021-08-23 VITALS — BP 143/86 | HR 61 | Temp 98.4°F | Ht 66.0 in | Wt 208.0 lb

## 2021-08-23 DIAGNOSIS — R7303 Prediabetes: Secondary | ICD-10-CM

## 2021-08-23 DIAGNOSIS — Z6833 Body mass index (BMI) 33.0-33.9, adult: Secondary | ICD-10-CM

## 2021-08-23 DIAGNOSIS — Z6834 Body mass index (BMI) 34.0-34.9, adult: Secondary | ICD-10-CM

## 2021-08-23 DIAGNOSIS — E559 Vitamin D deficiency, unspecified: Secondary | ICD-10-CM | POA: Diagnosis not present

## 2021-08-23 DIAGNOSIS — E669 Obesity, unspecified: Secondary | ICD-10-CM

## 2021-08-23 MED ORDER — VITAMIN D (ERGOCALCIFEROL) 1.25 MG (50000 UNIT) PO CAPS: 50000.0000 [IU] | ORAL_CAPSULE | ORAL | 0 refills | Status: DC

## 2021-08-23 NOTE — Progress Notes (Signed)
Chief Complaint:   OBESITY Sherry Marquez is here to discuss her progress with her obesity treatment plan along with follow-up of her obesity related diagnoses. Sherry Marquez is on the Category 3 Plan and states she is following her eating plan approximately 75% of the time. Sherry Marquez states she is does light weight for 25 minutes 4 times per week and walking for 45 minutes 4 times per week.  Today's visit was #: 9 Starting weight: 232 lbs Starting date: 04/28/2021 Today's weight: 208 lbs Today's date: 08/23/2021 Total lbs lost to date: 24 lbs Total lbs lost since last in-office visit: 5 lbs  Interim History: Sherry Marquez is down an additional 5 lbs over the holidays. She has been struggling since the holidays.   Subjective:   1. Vitamin D deficiency Sherry Marquez is currently taking Vitamin D.  2. Pre-diabetes Sherry Marquez is currently not on medications.  Assessment/Plan:   1. Vitamin D deficiency Low Vitamin D level contributes to fatigue and are associated with obesity, breast, and colon cancer. We will refill prescription Vitamin D 50,000 IU every week and Sherry Marquez will follow-up for routine testing of Vitamin D, at least 2-3 times per year to avoid over-replacement.  - Vitamin D, Ergocalciferol, (DRISDOL) 1.25 MG (50000 UNIT) CAPS capsule; Take 1 capsule (50,000 Units total) by mouth every 7 (seven) days.  Dispense: 4 capsule; Refill: 0  2. Pre-diabetes Sherry Marquez will continue to work on weight loss, exercise, and decreasing simple carbohydrates to help decrease the risk of diabetes. She will increase her water intake add lemon/lime or cranberry. She will have no sweets and minimum bread.   3. Obesity, current BMI 33.7 Sherry Marquez is currently in the action stage of change. As such, her goal is to continue with weight loss efforts. She has agreed to the Category 3 Plan.   Sherry Marquez will continue meal planning and she will continue intentional eating.  Exercise goals:  As is.  Behavioral modification strategies: increasing  lean protein intake, decreasing simple carbohydrates, increasing vegetables, increasing water intake, decreasing eating out, no skipping meals, meal planning and cooking strategies, keeping healthy foods in the home, and planning for success.  Sherry Marquez has agreed to follow-up with our clinic in 3-4 weeks (fasting). She was informed of the importance of frequent follow-up visits to maximize her success with intensive lifestyle modifications for her multiple health conditions.   Objective:   Blood pressure (!) 143/86, pulse 61, temperature 98.4 F (36.9 C), height 5' 6"  (1.676 m), weight 208 lb (94.3 kg), SpO2 98 %. Body mass index is 33.57 kg/m.  General: Cooperative, alert, well developed, in no acute distress. HEENT: Conjunctivae and lids unremarkable. Cardiovascular: Regular rhythm.  Lungs: Normal work of breathing. Neurologic: No focal deficits.   Lab Results  Component Value Date   CREATININE 0.93 02/24/2021   BUN 15 02/24/2021   NA 137 02/24/2021   K 4.4 02/24/2021   CL 102 02/24/2021   CO2 29 02/24/2021   Lab Results  Component Value Date   ALT 23 02/24/2021   AST 20 02/24/2021   ALKPHOS 74 02/24/2021   BILITOT 0.3 02/24/2021   Lab Results  Component Value Date   HGBA1C 5.7 02/24/2021   HGBA1C 5.7 10/29/2013   Lab Results  Component Value Date   INSULIN 12.2 04/28/2021   Lab Results  Component Value Date   TSH 2.340 04/28/2021   Lab Results  Component Value Date   CHOL 216 (H) 02/24/2021   HDL 48.80 02/24/2021   LDLCALC 140 (H)  02/24/2021   TRIG 138.0 02/24/2021   CHOLHDL 4 02/24/2021   Lab Results  Component Value Date   VD25OH 22.1 (L) 04/28/2021   Lab Results  Component Value Date   WBC 4.6 02/24/2021   HGB 15.0 02/24/2021   HCT 44.0 02/24/2021   MCV 89.8 02/24/2021   PLT 241.0 02/24/2021   Lab Results  Component Value Date   FERRITIN 58 09/05/2020   Attestation Statements:   Reviewed by clinician on day of visit: allergies, medications,  problem list, medical history, surgical history, family history, social history, and previous encounter notes.  I, Lizbeth Bark, RMA, am acting as Location manager for [employer], DO.  I have reviewed the above documentation for accuracy and completeness, and I agree with the above. Jearld Lesch, DO

## 2021-08-24 ENCOUNTER — Encounter (INDEPENDENT_AMBULATORY_CARE_PROVIDER_SITE_OTHER): Payer: Self-pay | Admitting: Bariatrics

## 2021-08-27 NOTE — Progress Notes (Signed)
°  Office: [phone]  /  Fax: [phone]    Date: September 07, 2021   Appointment Start Time: 4:31pm Duration: 26 minutes Provider: Glennie Isle, Psy.D. Type of Session: Individual Therapy  Location of Patient: Home (private location) Location of Provider: Provider's Home (private office) Type of Contact: Telepsychological Visit via MyChart Video Visit  Session Content: Sherry Marquez is a 51 y.o. female presenting for a follow-up appointment to address the previously established treatment goal of increasing coping skills.Today's appointment was a telepsychological visit due to COVID-19. [name] provided verbal consent for today's telepsychological appointment and she is aware she is responsible for securing confidentiality on her end of the session. Prior to proceeding with today's appointment, [name]'s physical location at the time of this appointment was obtained as well a phone number she could be reached at in the event of technical difficulties. Sherry Marquez and this provider participated in today's telepsychological service.   This provider conducted a brief check-in. Sherry Marquez shared about her recent appointment with [name], noting they discussed coping with cravings. Psychoeducation regarding triggers for emotional eating was provided. Sherry Marquez was provided a handout, and encouraged to utilize the handout between now and the next appointment to increase awareness of triggers and frequency. [name] agreed. [name] provided verbal consent during today's appointment for this provider to send a handout about triggers via e-mail. Remainder of session focused further on self-compassion. Overall, Sherry Marquez was receptive to today's appointment as evidenced by openness to sharing, responsiveness to feedback, and willingness to continue engaging in learned skills.  Mental Status Examination:  Appearance: well groomed and appropriate hygiene  Behavior: appropriate to circumstances Mood: sad Affect: mood congruent; tearful  at times Speech: WNL Eye Contact: appropriate Psychomotor Activity: WNL Gait: unable to assess Thought Process: linear, logical, and goal directed and no evidence or endorsement of suicidal, homicidal, and self-harm ideation, plan and intent  Thought Content/Perception: no hallucinations, delusions, bizarre thinking or behavior endorsed or observed Orientation: AAOx4 Memory/Concentration: memory, attention, language, and fund of knowledge intact  Insight/Judgment: fair  Interventions:  Conducted a brief chart review Provided empathic reflections and validation Reviewed content from the previous session Employed supportive psychotherapy interventions to facilitate reduced distress and to improve coping skills with identified stressors Psychoeducation provided regarding triggers for emotional eating behaviors Psychoeducation provided regarding self-compassion  DSM-5 Diagnosis(es):  F50.89 Other Specified Feeding or Eating Disorder, Emotional Eating Behaviors and S88.6 Uncomplicated Bereavement  Treatment Goal & Progress: During the initial appointment with this provider, the following treatment goal was established: increase coping skills. Sherry Marquez has demonstrated progress in her goal as evidenced by increased awareness of hunger patterns, increased awareness of triggers for emotional eating behaviors, and reduction in emotional eating behaviors . Sherry Marquez also continues to demonstrate willingness to engage in learned skill(s).  Plan: Due to recent events, an additional appointment was recommended. As such, the next appointment will be scheduled in two weeks, which will be via MyChart Video Visit. The next session will focus on working towards the established treatment goal and termination. Sherry Marquez will continue with her primary therapist.

## 2021-08-31 ENCOUNTER — Ambulatory Visit: Payer: 59 | Admitting: Psychology

## 2021-08-31 DIAGNOSIS — F4321 Adjustment disorder with depressed mood: Secondary | ICD-10-CM

## 2021-08-31 NOTE — Progress Notes (Signed)
[facility] Counselor/Therapist Progress Note  Patient ID: Sherry Marquez, MRN: [id number],    Date: 08/31/2021  Time Spent: 11:00am-11:50am    50 minutes   Treatment Type: Individual Therapy  Reported Symptoms: stress  Mental Status Exam: Appearance:  Neat     Behavior: Appropriate  Motor: Normal  Speech/Language:  Normal Rate  Affect: Appropriate  Mood: normal  Thought process: normal  Thought content:   WNL  Sensory/Perceptual disturbances:   WNL  Orientation: oriented to person, place, and time/date  Attention: Good  Concentration: Good  Memory: WNL  Fund of knowledge:  Good  Insight:   Good  Judgment:  Good  Impulse Control: Good   Risk Assessment: Danger to Self:  No Self-injurious Behavior: No Danger to Others: No Duty to Warn:no Physical Aggression / Violence:No  Access to Firearms a concern: No  Gang Involvement:No   Subjective:   Pt present for face-to-face individual therapy via video Webex.  Pt consents to telehealth video session due to COVID 19 pandemic. Location of pt: home  Location of therapist: home office.   Pt talked about this being a time in her life where she is starting to take action on things that she has avoided for a long time.  Pt is making improvements in her health.  Pt is involved in [facility] program and working on losing weight.  She has added exercise. Pt states she did not journal the past couple of weeks.  She is nervous about what will come out of it regarding her thoughts and opinions.    Pt has been thinking about feeling disconnected from her extended family.  Last year pt lost two of her uncles and did not get to go to the funeral bc she was still recovering from [place] and on oxygen.   Pt feels guilt about not going bc they all really reconnected.  Pt was so overwhelmed with everything in her life that she was experiencing but she still feels guilty.   Pt did not reach out through messaging in  ways she wishes she would have. Addressed how pt can forgive herself and let go of the guilt.   Pt picked a word for this year and chose the word "reclaim".  Addressed how pt can "reclaim grace for herself."    Pt talked about her relationship with her husband.  Pt had a conversation about going to visit her parents.   Pt wants to visit so she won't regret not spending time with them.   Pt was able to express her thoughts and feelings with her husband.   Worked with pt on how she can give herself permission to form her own opinions.  Encouraged pt to journal about her thoughts and opinions.    Provided supportive therapy.    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  (Treatment Plan target date: 06/27/2022)  Pt is progressing toward treatment goals.   Pt wants a safe place to talk and to improve coping skills.  Pt would like to work on figuring out her own opinions and to communicate them.  Pt would like to increase confidence and self esteem.  Pt wants to learn to be assertive.    Plan to continue to meet every two weeks.    Kinser Fellman, LCSW

## 2021-09-01 ENCOUNTER — Encounter: Payer: Self-pay | Admitting: Family Medicine

## 2021-09-01 ENCOUNTER — Ambulatory Visit (INDEPENDENT_AMBULATORY_CARE_PROVIDER_SITE_OTHER): Payer: 59 | Admitting: Family Medicine

## 2021-09-01 VITALS — BP 100/70 | HR 59 | Temp 98.7°F | Ht 65.0 in | Wt 212.2 lb

## 2021-09-01 DIAGNOSIS — Z1211 Encounter for screening for malignant neoplasm of colon: Secondary | ICD-10-CM | POA: Diagnosis not present

## 2021-09-01 DIAGNOSIS — Z0000 Encounter for general adult medical examination without abnormal findings: Secondary | ICD-10-CM | POA: Diagnosis not present

## 2021-09-01 NOTE — Patient Instructions (Signed)
Keep up the great work with healthy eating, exercise and weight loss!  You will be due for cholesterol recheck in July unless it is done sooner.

## 2021-09-01 NOTE — Progress Notes (Signed)
Sherry Marquez DOB: 08-Dec-1970 Encounter date: 09/01/2021  This is a 51 y.o. female who presents for complete physical   History of present illness/Additional concerns: Last visit with me was 03/01/2021. No worries today. Started back with healthy weight and wellness. She has lost 25 pounds since starting(but started from higher weight than last visit). She is controlling carb intake. Working on 1500 cal diet. She has started exercise 3-4 days/week in last few weeks.  Follows regularly with gynecology - Dr. Corwin Levins. Has appointment in march. Periods are irregular - none for 59mo then 2 weeks of bleeding.   Follows regularly with dermatology specialist. Due for visit. Last visit a couple years ago.  Vitamin d supplement really helped with energy level.   Has regular therapist and food therapist. Just a lot of grief in last year.   She is behind on mammogram, but will schedule.  Allergies off and on.   Past Medical History:  Diagnosis Date   Allergy    Anxiety    Back pain    COVID    [place]    Eczema    Environmental and seasonal allergies    High cholesterol    History of gestational diabetes    Hx of blood clots    Joint pain    Lack of energy    LUMBAR SPRAIN AND STRAIN 12/05/2009   Qualifier: Diagnosis of  By: FSarajane JewsMD, SIshmael Holter   Obesity    Osteoarthritis    Pneumonia    Right ovarian cyst    Rosacea    SOB (shortness of breath)    Past Surgical History:  Procedure Laterality Date   CESAREAN SECTION  03-15-2005   dr fPhineas Real @[facility]    LAPAROSCOPY N/A 05/20/2019   Procedure: LAPAROSCOPY DIAGNOSTIC;  Surgeon: FAnastasio Auerbach MD;  [facility]  Service: Gynecology;  Laterality: N/A;   WISDOM TOOTH EXTRACTION  2001   Allergies  Allergen Reactions   Cefotan [Cefotetan] Rash    Developed rash during IV infusion- dose completed -benadryl given, no other issues occured   Current Meds  Medication Sig   cetirizine  (ZYRTEC) 10 MG tablet Take 10 mg by mouth daily.   ibuprofen (ADVIL) 400 MG tablet Take 400 mg by mouth every 6 (six) hours as needed.   POTASSIUM CHLORIDE PO Take 640 mg by mouth.   Vitamin D, Ergocalciferol, (DRISDOL) 1.25 MG (50000 UNIT) CAPS capsule Take 1 capsule (50,000 Units total) by mouth every 7 (seven) days.   Social History   Tobacco Use   Smoking status: Never   Smokeless tobacco: Never  Substance Use Topics   Alcohol use: Yes    Comment: rare   Family History  Problem Relation Age of Onset   Atrial fibrillation Mother    Hypothyroidism Mother    Hypertension Mother    Heart disease Mother    Sleep apnea Mother    Obesity Mother    Heart disease Father        defibrillator   Hypertension Father    Heart attack Father 454  CAD Father        4-way bypass   High Cholesterol Father    Sleep apnea Father    Obesity Father    Hypertension Brother    Lymphoma Maternal Grandmother 890  Congestive Heart Failure Maternal Grandmother    Stroke Maternal Grandfather 87  Cancer Paternal Grandmother        mets  to lung   Heart attack Paternal Grandfather        early death     Review of Systems  Constitutional:  Negative for activity change, appetite change, chills, fatigue, fever and unexpected weight change.  HENT:  Negative for congestion, ear pain, hearing loss, sinus pressure, sinus pain, sore throat and trouble swallowing.   Eyes:  Negative for pain and visual disturbance.  Respiratory:  Negative for cough, chest tightness, shortness of breath and wheezing.   Cardiovascular:  Negative for chest pain, palpitations and leg swelling.  Gastrointestinal:  Negative for abdominal pain, blood in stool, constipation, diarrhea, nausea and vomiting.  Genitourinary:  Negative for difficulty urinating and menstrual problem.       Incontinence with laughing/sneezing  Musculoskeletal:  Negative for arthralgias and back pain.  Skin:  Negative for rash.  Neurological:   Negative for dizziness, weakness, numbness and headaches.  Hematological:  Negative for adenopathy. Does not bruise/bleed easily.  Psychiatric/Behavioral:  Negative for sleep disturbance and suicidal ideas. The patient is not nervous/anxious.    CBC:  Lab Results  Component Value Date   WBC 4.6 02/24/2021   HGB 15.0 02/24/2021   HGB 12.7 01/31/2019   HCT 44.0 02/24/2021   HCT 38.0 01/31/2019   MCH 30.4 09/07/2020   MCHC 34.1 02/24/2021   RDW 14.8 02/24/2021   RDW 12.5 01/31/2019   PLT 241.0 02/24/2021   PLT 603 (H) 01/31/2019   MPV 10.5 12/06/2019   CMP: Lab Results  Component Value Date   NA 137 02/24/2021   K 4.4 02/24/2021   CL 102 02/24/2021   CO2 29 02/24/2021   ANIONGAP 12 09/02/2020   GLUCOSE 105 (H) 02/24/2021   BUN 15 02/24/2021   CREATININE 0.93 02/24/2021   CREATININE 1.03 12/06/2019   CALCIUM 8.9 02/24/2021   PROT 6.3 02/24/2021   BILITOT 0.3 02/24/2021   ALKPHOS 74 02/24/2021   ALT 23 02/24/2021   AST 20 02/24/2021   LIPID: Lab Results  Component Value Date   CHOL 216 (H) 02/24/2021   TRIG 138.0 02/24/2021   HDL 48.80 02/24/2021   LDLCALC 140 (H) 02/24/2021   LDLCALC 152 (H) 12/06/2019    Objective:  BP 100/70 (BP Location: Left Arm, Patient Position: Sitting, Cuff Size: Large)    Pulse (!) 59    Temp 98.7 F (37.1 C) (Oral)    Ht 5' 5"  (1.651 m)    Wt 212 lb 3.2 oz (96.3 kg)    SpO2 96%    BMI 35.31 kg/m   Weight: 212 lb 3.2 oz (96.3 kg)   BP Readings from Last 3 Encounters:  09/01/21 100/70  08/23/21 (!) 143/86  07/05/21 140/84   Wt Readings from Last 3 Encounters:  09/01/21 212 lb 3.2 oz (96.3 kg)  08/23/21 208 lb (94.3 kg)  07/05/21 213 lb (96.6 kg)    Physical Exam Constitutional:      General: She is not in acute distress.    Appearance: She is well-developed.  HENT:     Head: Normocephalic and atraumatic.     Right Ear: External ear normal.     Left Ear: External ear normal.     Mouth/Throat:     Pharynx: No oropharyngeal  exudate.  Eyes:     Conjunctiva/sclera: Conjunctivae normal.     Pupils: Pupils are equal, round, and reactive to light.  Neck:     Thyroid: No thyromegaly.  Cardiovascular:     Rate and Rhythm: Normal rate and regular  rhythm.     Heart sounds: Normal heart sounds. No murmur heard.   No friction rub. No gallop.  Pulmonary:     Effort: Pulmonary effort is normal.     Breath sounds: Normal breath sounds.  Abdominal:     General: Bowel sounds are normal. There is no distension.     Palpations: Abdomen is soft. There is no mass.     Tenderness: There is no abdominal tenderness. There is no guarding.     Hernia: No hernia is present.  Musculoskeletal:        General: No tenderness or deformity. Normal range of motion.     Cervical back: Normal range of motion and neck supple.  Lymphadenopathy:     Cervical: No cervical adenopathy.  Skin:    General: Skin is warm and dry.     Findings: No rash.  Neurological:     Mental Status: She is alert and oriented to person, place, and time.     Deep Tendon Reflexes: Reflexes normal.     Reflex Scores:      Tricep reflexes are 2+ on the right side and 2+ on the left side.      Bicep reflexes are 2+ on the right side and 2+ on the left side.      Brachioradialis reflexes are 2+ on the right side and 2+ on the left side.      Patellar reflexes are 2+ on the right side and 2+ on the left side. Psychiatric:        Speech: Speech normal.        Behavior: Behavior normal.        Thought Content: Thought content normal.    Assessment/Plan: Health Maintenance Due  Topic Date Due   COLONOSCOPY (Pts 45-30yr Insurance coverage will need to be confirmed)  Never done   MAMMOGRAM  09/01/2021   Health Maintenance reviewed - cologuard ordered; she will set up mammogram.  1. Preventative health care She is working with healthy weight/wellness and is continuing to lose weight. Keep up good work. Discussed always having mindset of change when she hits  plateaus with weight loss.  2. Screening for colon cancer - Cologuard   Return in about 1 year (around 09/01/2022) for physical exam.  JMicheline Rough MD

## 2021-09-07 ENCOUNTER — Telehealth (INDEPENDENT_AMBULATORY_CARE_PROVIDER_SITE_OTHER): Payer: 59 | Admitting: Psychology

## 2021-09-07 DIAGNOSIS — F5089 Other specified eating disorder: Secondary | ICD-10-CM

## 2021-09-07 DIAGNOSIS — Z634 Disappearance and death of family member: Secondary | ICD-10-CM

## 2021-09-07 NOTE — Progress Notes (Signed)
°  Office: [phone]  /  Fax: [phone]    Date: September 21, 2021   Appointment Start Time: 11:30am Duration: 24 minutes Provider: Glennie Isle, Psy.D. Type of Session: Individual Therapy  Location of Patient: Home (private location) Location of Provider: Provider's Home (private office) Type of Contact: Telepsychological Visit via MyChart Video Visit  Session Content: Sherry Marquez is a 51 y.o. female presenting for a follow-up appointment to address the previously established treatment goal of increasing coping skills.Today's appointment was a telepsychological visit due to COVID-19. Sherry Marquez provided verbal consent for today's telepsychological appointment and she is aware she is responsible for securing confidentiality on her end of the session. Prior to proceeding with today's appointment, Sherry Marquez's physical location at the time of this appointment was obtained as well a phone number she could be reached at in the event of technical difficulties. Sherry Marquez and this provider participated in today's telepsychological service.   This provider conducted a brief check-in. Sherry Marquez reported she did well with her eating habits after the last appointment with this provider, but felt she "slipped a little." Further explored and processed. Reflected on progress to date (e.g., increased self-awareness, change in mindset, decrease in all or nothing thinking) and reviewed learned skills. Overall, Sherry Marquez was receptive to today's appointment as evidenced by openness to sharing, responsiveness to feedback, and willingness to continue engaging in learned skills.  Mental Status Examination:  Appearance: neat Behavior: appropriate to circumstances Mood: neutral Affect: mood congruent; tearful at times  Speech: WNL Eye Contact: appropriate Psychomotor Activity: WNL Gait: unable to assess Thought Process: linear, logical, and goal directed and no evidence or endorsement of suicidal, homicidal, and self-harm ideation,  plan and intent  Thought Content/Perception: no hallucinations, delusions, bizarre thinking or behavior endorsed or observed Orientation: AAOx4 Memory/Concentration: memory, attention, language, and fund of knowledge intact  Insight: good Judgment: good  Interventions:  Conducted a brief chart review Provided empathic reflections and validation Employed supportive psychotherapy interventions to facilitate reduced distress and to improve coping skills with identified stressors Reviewed learned skills  DSM-5 Diagnosis(es):  F50.89 Other Specified Feeding or Eating Disorder, Emotional Eating Behaviors and P50.9 Uncomplicated Bereavement  Treatment Goal & Progress: During the initial appointment with this provider, the following treatment goal was established: increase coping skills. Sherry Marquez demonstrated progress in her goal as evidenced by increased awareness of hunger patterns, increased awareness of triggers for emotional eating behaviors, and reduction in emotional eating behaviors . Sherry Marquez also continues to demonstrate willingness to engage in learned skill(s).  Plan: As previously planned, today was Sherry Marquez's last appointment with this provider. She acknowledged understanding that she may request a follow-up appointment with this provider in the future as long as she is still established with the clinic. Sherry Marquez will continue with her primary therapist. No further follow-up planned by this provider.

## 2021-09-17 ENCOUNTER — Ambulatory Visit (INDEPENDENT_AMBULATORY_CARE_PROVIDER_SITE_OTHER): Payer: 59 | Admitting: Psychology

## 2021-09-17 DIAGNOSIS — F4321 Adjustment disorder with depressed mood: Secondary | ICD-10-CM

## 2021-09-17 NOTE — Progress Notes (Signed)
[facility] Counselor/Therapist Progress Note  Patient ID: Sherry Marquez, MRN: [id number],    Date: 09/17/2021  Time Spent: 10:00am-11:00am    60 minutes   Treatment Type: Individual Therapy  Reported Symptoms: stress  Mental Status Exam: Appearance:  Neat     Behavior: Appropriate  Motor: Normal  Speech/Language:  Normal Rate  Affect: Appropriate  Mood: normal  Thought process: normal  Thought content:   WNL  Sensory/Perceptual disturbances:   WNL  Orientation: oriented to person, place, and time/date  Attention: Good  Concentration: Good  Memory: WNL  Fund of knowledge:  Good  Insight:   Good  Judgment:  Good  Impulse Control: Good   Risk Assessment: Danger to Self:  No Self-injurious Behavior: No Danger to Others: No Duty to Warn:no Physical Aggression / Violence:No  Access to Firearms a concern: No  Gang Involvement:No   Subjective:   Pt present for face-to-face individual therapy via video Webex.  Pt consents to telehealth video session due to COVID 19 pandemic. Location of pt: home  Location of therapist: home office.   Pt talked about having a rough day yesterday bc she has not been sleeping well.  Pt had little energy and her kids were home with her.  Pt felt "grumpy" and was stressed.   Pt feels determined to have a better day today.   Pt gets her puppy next weekend.  She is very excited to get the puppy.   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  Pt is planning to train her puppy.  She feels some stress about the realities of having a puppy regarding all the work and disruptions.  Worked on Child psychotherapist. Pt did some journaling.  Pt wrote about things she would ike to do with the summer as a family.  She wants to be more intentional about planning activities and a family vacation.   Pt talked about her kids.  Her sons are 36 and 55 years old.  Addressed the challenges of parenting teens.   Pt has a lot of appointments to make for  herself.  She needs to get a mammogram and set other health appointments but is having trouble getting motivated.  Worked with pt on breaking things down into small attainable goals.   Pt states she is struggling with her diet.   She is having trouble adhering to her program.  She had cookies and then had trouble getting back on track.  Pt feels frustrated with herself.  Pt may consider taking medication to help with cravings and weight loss.  She feels disappointed in herself that she may need to take medication.  She is being hard on herself.  Helped pt process her feelings and worked on Solicitor and compassion.   Pt is worrying about her parents' health.  Addressed her concerns about her parents.  Provided supportive therapy.    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  (Treatment Plan target date: 06/27/2022)  Pt is progressing toward treatment goals.   Pt wants a safe place to talk and to improve coping skills.  Pt would like to work on figuring out her own opinions and to communicate them.  Pt would like to increase confidence and self esteem.  Pt wants to learn to be assertive.    Plan to continue to meet every two weeks.    Zeenat Jeanbaptiste, LCSW

## 2021-09-20 ENCOUNTER — Encounter (INDEPENDENT_AMBULATORY_CARE_PROVIDER_SITE_OTHER): Payer: Self-pay | Admitting: Bariatrics

## 2021-09-20 ENCOUNTER — Other Ambulatory Visit: Payer: Self-pay

## 2021-09-20 ENCOUNTER — Ambulatory Visit (INDEPENDENT_AMBULATORY_CARE_PROVIDER_SITE_OTHER): Payer: 59 | Admitting: Bariatrics

## 2021-09-20 VITALS — BP 125/83 | HR 63 | Temp 97.8°F | Ht 66.0 in | Wt 208.0 lb

## 2021-09-20 DIAGNOSIS — E669 Obesity, unspecified: Secondary | ICD-10-CM | POA: Diagnosis not present

## 2021-09-20 DIAGNOSIS — Z6833 Body mass index (BMI) 33.0-33.9, adult: Secondary | ICD-10-CM

## 2021-09-20 DIAGNOSIS — R7303 Prediabetes: Secondary | ICD-10-CM

## 2021-09-20 DIAGNOSIS — E785 Hyperlipidemia, unspecified: Secondary | ICD-10-CM | POA: Diagnosis not present

## 2021-09-20 DIAGNOSIS — E559 Vitamin D deficiency, unspecified: Secondary | ICD-10-CM

## 2021-09-20 MED ORDER — VITAMIN D (ERGOCALCIFEROL) 1.25 MG (50000 UNIT) PO CAPS: 50000.0000 [IU] | ORAL_CAPSULE | ORAL | 0 refills | Status: DC

## 2021-09-20 NOTE — Progress Notes (Signed)
Chief Complaint:   OBESITY Sherry Marquez is here to discuss her progress with her obesity treatment plan along with follow-up of her obesity related diagnoses. Sherry Marquez is on the Category 3 Plan and states she is following her eating plan approximately 75-80% of the time. Sherry Marquez states she is doing 0 minutes 0 times per week.  Today's visit was #: 10 Starting weight: 232 lbs Starting date: 04/28/2021 Today's weight: 208 lbs Today's date: 09/20/2021 Total lbs lost to date: 24 lbs Total lbs lost since last in-office visit: 0  Interim History: Sherry Marquez weight remains the same.  Subjective:   1. Vitamin D deficiency Sherry Marquez is taking Vitamin D as directed.   2. Dyslipidemia Sherry Marquez is taking her medication as directed.   3. Pre-diabetes Sherry Marquez is not on medications currently.   Assessment/Plan:   1. Vitamin D deficiency Low Vitamin D level contributes to fatigue and are associated with obesity, breast, and colon cancer. We will refill prescription Vitamin D 50,000 IU every week for 1 month with no refills and [facility] will follow-up for routine testing of Vitamin D, at least 2-3 times per year to avoid over-replacement.  - Vitamin D, Ergocalciferol, (DRISDOL) 1.25 MG (50000 UNIT) CAPS capsule; Take 1 capsule (50,000 Units total) by mouth every 7 (seven) days.  Dispense: 4 capsule; Refill: 0 - VITAMIN D 25 Hydroxy (Vit-D Deficiency, Fractures)  2. Dyslipidemia Cardiovascular risk and specific lipid/LDL goals reviewed.  We will check Lipids and CMP today. We discussed several lifestyle modifications today and Sherry Marquez will continue to work on diet, exercise and weight loss efforts. Orders and follow up as documented in patient record.   Counseling Intensive lifestyle modifications are the first line treatment for this issue. Dietary changes: Increase soluble fiber. Decrease simple carbohydrates. Exercise changes: Moderate to vigorous-intensity aerobic activity 150 minutes per week if  tolerated. Lipid-lowering medications: see documented in medical record.  - Comprehensive metabolic panel - Lipid Panel With LDL/HDL Ratio  3. Pre-diabetes We will check A1C and insulin today. Sherry Marquez will continue to work on weight loss, exercise, and decreasing simple carbohydrates to help decrease the risk of diabetes.   - Hemoglobin A1c - Insulin, random  4. Obesity, current BMI 33.6 Sherry Marquez is currently in the action stage of change. As such, her goal is to continue with weight loss efforts. She has agreed to the Category 3 Plan.   Sherry Marquez will continue meal planning and she will continue intentional eating. She will journal. We discussed weight loss medications.  Exercise goals: No exercise has been prescribed at this time.  Behavioral modification strategies: increasing lean protein intake, decreasing simple carbohydrates, increasing vegetables, increasing water intake, decreasing eating out, no skipping meals, meal planning and cooking strategies, keeping healthy foods in the home, and planning for success.  Sherry Marquez has agreed to follow-up with our clinic in 3-4 weeks. She was informed of the importance of frequent follow-up visits to maximize her success with intensive lifestyle modifications for her multiple health conditions.   Sherry Marquez was informed we would discuss her lab results at her next visit unless there is a critical issue that needs to be addressed sooner. Sherry Marquez agreed to keep her next visit at the agreed upon time to discuss these results.  Objective:   Blood pressure 125/83, pulse 63, temperature 97.8 F (36.6 C), height 5' 6"  (1.676 m), weight 208 lb (94.3 kg), SpO2 98 %. Body mass index is 33.57 kg/m.  General: Cooperative, alert, well developed, in no acute distress. HEENT: Conjunctivae  and lids unremarkable. Cardiovascular: Regular rhythm.  Lungs: Normal work of breathing. Neurologic: No focal deficits.   Lab Results  Component Value Date   CREATININE 0.93  02/24/2021   BUN 15 02/24/2021   NA 137 02/24/2021   K 4.4 02/24/2021   CL 102 02/24/2021   CO2 29 02/24/2021   Lab Results  Component Value Date   ALT 23 02/24/2021   AST 20 02/24/2021   ALKPHOS 74 02/24/2021   BILITOT 0.3 02/24/2021   Lab Results  Component Value Date   HGBA1C 5.7 02/24/2021   HGBA1C 5.7 10/29/2013   Lab Results  Component Value Date   INSULIN 12.2 04/28/2021   Lab Results  Component Value Date   TSH 2.340 04/28/2021   Lab Results  Component Value Date   CHOL 216 (H) 02/24/2021   HDL 48.80 02/24/2021   LDLCALC 140 (H) 02/24/2021   TRIG 138.0 02/24/2021   CHOLHDL 4 02/24/2021   Lab Results  Component Value Date   VD25OH 22.1 (L) 04/28/2021   Lab Results  Component Value Date   WBC 4.6 02/24/2021   HGB 15.0 02/24/2021   HCT 44.0 02/24/2021   MCV 89.8 02/24/2021   PLT 241.0 02/24/2021   Lab Results  Component Value Date   FERRITIN 58 09/05/2020   Attestation Statements:   Reviewed by clinician on day of visit: allergies, medications, problem list, medical history, surgical history, family history, social history, and previous encounter notes.  I, Lizbeth Bark, RMA, am acting as Location manager for [employer], DO.  I have reviewed the above documentation for accuracy and completeness, and I agree with the above. Jearld Lesch, DO

## 2021-09-21 ENCOUNTER — Telehealth (INDEPENDENT_AMBULATORY_CARE_PROVIDER_SITE_OTHER): Payer: 59 | Admitting: Psychology

## 2021-09-21 DIAGNOSIS — F5089 Other specified eating disorder: Secondary | ICD-10-CM | POA: Diagnosis not present

## 2021-09-21 DIAGNOSIS — Z634 Disappearance and death of family member: Secondary | ICD-10-CM | POA: Diagnosis not present

## 2021-09-21 LAB — COMPREHENSIVE METABOLIC PANEL
ALT: 32 IU/L (ref 0–32)
AST: 28 IU/L (ref 0–40)
Albumin/Globulin Ratio: 2 (ref 1.2–2.2)
Albumin: 4.6 g/dL (ref 3.8–4.8)
Alkaline Phosphatase: 102 IU/L (ref 44–121)
BUN/Creatinine Ratio: 25 — ABNORMAL HIGH (ref 9–23)
BUN: 21 mg/dL (ref 6–24)
Bilirubin Total: 0.4 mg/dL (ref 0.0–1.2)
CO2: 23 mmol/L (ref 20–29)
Calcium: 9.4 mg/dL (ref 8.7–10.2)
Chloride: 102 mmol/L (ref 96–106)
Creatinine, Ser: 0.84 mg/dL (ref 0.57–1.00)
Globulin, Total: 2.3 g/dL (ref 1.5–4.5)
Glucose: 105 mg/dL — ABNORMAL HIGH (ref 70–99)
Potassium: 5.1 mmol/L (ref 3.5–5.2)
Sodium: 140 mmol/L (ref 134–144)
Total Protein: 6.9 g/dL (ref 6.0–8.5)
eGFR: 85 mL/min/{1.73_m2} (ref >59)

## 2021-09-21 LAB — LIPID PANEL WITH LDL/HDL RATIO
Cholesterol, Total: 249 mg/dL — ABNORMAL HIGH (ref 100–199)
HDL: 49 mg/dL (ref >39)
LDL Chol Calc (NIH): 184 mg/dL — ABNORMAL HIGH (ref 0–99)
LDL/HDL Ratio: 3.8 ratio — ABNORMAL HIGH (ref 0.0–3.2)
Triglycerides: 90 mg/dL (ref 0–149)
VLDL Cholesterol Cal: 16 mg/dL (ref 5–40)

## 2021-09-21 LAB — HEMOGLOBIN A1C
Est. average glucose Bld gHb Est-mCnc: 111 mg/dL
Hgb A1c MFr Bld: 5.5 % (ref 4.8–5.6)

## 2021-09-21 LAB — INSULIN, RANDOM: INSULIN: 9.4 u[IU]/mL (ref 2.6–24.9)

## 2021-09-21 LAB — VITAMIN D 25 HYDROXY (VIT D DEFICIENCY, FRACTURES): Vit D, 25-Hydroxy: 42.9 ng/mL (ref 30.0–100.0)

## 2021-09-22 ENCOUNTER — Encounter (INDEPENDENT_AMBULATORY_CARE_PROVIDER_SITE_OTHER): Payer: Self-pay | Admitting: Bariatrics

## 2021-09-29 LAB — COLOGUARD: COLOGUARD: NEGATIVE

## 2021-10-01 ENCOUNTER — Ambulatory Visit: Payer: 59 | Admitting: Psychology

## 2021-10-01 DIAGNOSIS — F4321 Adjustment disorder with depressed mood: Secondary | ICD-10-CM | POA: Diagnosis not present

## 2021-10-01 NOTE — Progress Notes (Signed)
[facility] Counselor/Therapist Progress Note  Patient ID: Sherry Marquez, MRN: [id number],    Date: 10/01/2021  Time Spent: 10:00am-10:50am    50 minutes   Treatment Type: Individual Therapy  Reported Symptoms: stress  Mental Status Exam: Appearance:  Neat     Behavior: Appropriate  Motor: Normal  Speech/Language:  Normal Rate  Affect: Appropriate  Mood: normal  Thought process: normal  Thought content:   WNL  Sensory/Perceptual disturbances:   WNL  Orientation: oriented to person, place, and time/date  Attention: Good  Concentration: Good  Memory: WNL  Fund of knowledge:  Good  Insight:   Good  Judgment:  Good  Impulse Control: Good   Risk Assessment: Danger to Self:  No Self-injurious Behavior: No Danger to Others: No Duty to Warn:no Physical Aggression / Violence:No  Access to Firearms a concern: No  Gang Involvement:No   Subjective:   Pt present for face-to-face individual therapy via video Webex.  Pt consents to telehealth video session due to COVID 19 pandemic. Location of pt: home  Location of therapist: home office.   Pt talked about getting a puppy last week.  She loves him and he is adorable but he is a lot of work.   Pt is feeling a little overwhelmed.    Pt named the Higher education careers adviser.   Addressed pt's overwhelm and worked on Child psychotherapist.    Pt's sons are not very interested in the puppy which is disappointing for pt.   Pt talked about her diet.  She keeps falling off the food plan.  She identified why she falls off and came up with overwhelm, not being prepared, and boredom.   Addressed how pt can work on Government social research officer.   Worked on issues of emotional eating.   She is being hard on herself.  Helped pt process her feelings and worked on Solicitor and compassion.   Pt is trying to find balance between grace and accountability.   Pt talked about her parents.  They are doing better regarding their health.  This is reassuring  for pt.   Pt talked about her son learning how to drive.  Pt drives with him and he snaps at her when she tells him what to do.  Pt's feelings were hurt and she gave her son the "silent treatment".  She was disappointed in how she handled things.  Addressed how she can circle back to communicate about things to get resolution.   Pt talked about thinking about business ideas to make money.  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   Provided supportive therapy.    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  (Treatment Plan target date: 06/27/2022)  Pt is progressing toward treatment goals.   Pt wants a safe place to talk and to improve coping skills.  Pt would like to work on figuring out her own opinions and to communicate them.  Pt would like to increase confidence and self esteem.  Pt wants to learn to be assertive.    Plan to continue to meet every two weeks.    Lamanda Rudder, LCSW

## 2021-10-14 ENCOUNTER — Encounter: Payer: Self-pay | Admitting: Nurse Practitioner

## 2021-10-15 ENCOUNTER — Ambulatory Visit (INDEPENDENT_AMBULATORY_CARE_PROVIDER_SITE_OTHER): Payer: 59 | Admitting: Psychology

## 2021-10-15 DIAGNOSIS — F4321 Adjustment disorder with depressed mood: Secondary | ICD-10-CM | POA: Diagnosis not present

## 2021-10-15 NOTE — Progress Notes (Signed)
[facility] Counselor/Therapist Progress Note ? ?Patient ID: Sherry Marquez, MRN: [id number],   ? ?Date: 10/15/2021 ? ?Time Spent: 10:00am-10:55am    55 minutes  ? ?Treatment Type: Individual Therapy ? ?Reported Symptoms: stress ? ?Mental Status Exam: ?Appearance:  Neat     ?Behavior: Appropriate  ?Motor: Normal  ?Speech/Language:  Normal Rate  ?Affect: Appropriate  ?Mood: normal  ?Thought process: normal  ?Thought content:   WNL  ?Sensory/Perceptual disturbances:   WNL  ?Orientation: oriented to person, place, and time/date  ?Attention: Good  ?Concentration: Good  ?Memory: WNL  ?Fund of knowledge:  Good  ?Insight:   Good  ?Judgment:  Good  ?Impulse Control: Good  ? ?Risk Assessment: ?Danger to Self:  No ?Self-injurious Behavior: No ?Danger to Others: No ?Duty to Warn:no ?Physical Aggression / Violence:No  ?Access to Firearms a concern: No  ?Gang Involvement:No  ? ?Subjective:   ?Pt present for face-to-face individual therapy via video Webex.  Pt consents to telehealth video session due to COVID 19 pandemic. ?Location of pt: home  ?Location of therapist: home office.   ?Pt states she has felt off kilter this morning bc her son is home sick.  Pt feels on edge.  Helped pt decompress and worked on Buyer, retail. ?Pt talked about the adjustment to the puppy.  The puppy is doing the puppy biting and is peeing in the house a lot.  Addressed how stressful this is for pt.  She feels like she has to attend to the puppy most of the time and then can't get other things done.   ?Pt talked about her relationship with her husband.  They had arguments this past weekend.  He called pt out on "being in a funk" the past month.  He also confronted her about not accepting the compliments he gave her about the weight loss she has had.   Pt was tearful as she talked about not taking good care of herself the past month.  She has not felt good about herself.   Pt states her husband's feedback was important for her to  hear even though it was hard to hear.   It kind of "shook pt out of her funk".   Pt talked with her husband about feeling disconnected at times bc they do not spend a lot of time together.   Pt's husband works a lot and then isolates in the bedroom at times.   Addressed how they can build in some connection time.   ?Pt talked about her diet.  She is back on track with her food plan.   She is working on issues of emotional eating.   ?She tends to be hard on herself.  Helped pt process her feelings and worked on Solicitor and compassion.   ?Pt talked about her relationship with her brother.  They have always had a strained relationship.  Pt's brother was difficult to deal with as they were growing up.  Pt was always the good girl who did not express her feelings or create any problems.   Addressed how this impacts her current relationships.  Pt has a hard time sharing her feelings and vulnerabilities with her husband bc she does not want to "burden" him.   Addressed how sharing vulnerabilities creates connections.    ?Provided supportive therapy.   ? ?Interventions: Cognitive Behavioral Therapy and Grief Therapy ? 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 ?Plan: See pt's Treatment plan in Therapy Charts.  (Treatment Plan target date: 06/27/2022)  ?Pt is  progressing toward treatment goals.   ?Pt wants a safe place to talk and to improve coping skills.  Pt would like to work on figuring out her own opinions and to communicate them.  Pt would like to increase confidence and self esteem.  Pt wants to learn to be assertive.    ?Plan to continue to meet every two weeks.   ? ?Karenann Mcgrory, LCSW ? ? ? ?

## 2021-10-19 ENCOUNTER — Encounter (INDEPENDENT_AMBULATORY_CARE_PROVIDER_SITE_OTHER): Payer: Self-pay | Admitting: Bariatrics

## 2021-10-19 ENCOUNTER — Ambulatory Visit (INDEPENDENT_AMBULATORY_CARE_PROVIDER_SITE_OTHER): Payer: 59 | Admitting: Bariatrics

## 2021-10-19 ENCOUNTER — Other Ambulatory Visit: Payer: Self-pay

## 2021-10-19 VITALS — BP 138/84 | HR 67 | Temp 97.7°F | Ht 66.0 in | Wt 205.0 lb

## 2021-10-19 DIAGNOSIS — E669 Obesity, unspecified: Secondary | ICD-10-CM | POA: Diagnosis not present

## 2021-10-19 DIAGNOSIS — Z6833 Body mass index (BMI) 33.0-33.9, adult: Secondary | ICD-10-CM | POA: Diagnosis not present

## 2021-10-19 DIAGNOSIS — E785 Hyperlipidemia, unspecified: Secondary | ICD-10-CM

## 2021-10-19 DIAGNOSIS — E559 Vitamin D deficiency, unspecified: Secondary | ICD-10-CM | POA: Diagnosis not present

## 2021-10-19 MED ORDER — VITAMIN D (ERGOCALCIFEROL) 1.25 MG (50000 UNIT) PO CAPS: 50000.0000 [IU] | ORAL_CAPSULE | ORAL | 0 refills | Status: DC

## 2021-10-20 ENCOUNTER — Encounter (INDEPENDENT_AMBULATORY_CARE_PROVIDER_SITE_OTHER): Payer: Self-pay | Admitting: Bariatrics

## 2021-10-20 NOTE — Progress Notes (Signed)
? ? ? ?Chief Complaint:  ? ?OBESITY ?Sherry Marquez is here to discuss her progress with her obesity treatment plan along with follow-up of her obesity related diagnoses. Sherry Marquez is on the Category 3 Plan and states she is following her eating plan approximately 85% of the time. Sherry Marquez states she is doing 0 minutes 0 times per week. ? ?Today's visit was #: 09 ?Starting weight: 232 lbs ?Starting date: 04/28/2021 ?Today's weight: 205 lbs ?Today's date: 10/19/2021 ?Total lbs lost to date: 27 lbs ?Total lbs lost since last in-office visit: 3 lbs ? ?Interim History: Sherry Marquez is down an additional 3 lbs and doing well overall.  ? ?Subjective:  ? ?1. Vitamin D deficiency ?Sherry Marquez's last Vitamin D level was 42.9. She gets minimal sun exposure.  ? ?2. Dyslipidemia ?Sherry Marquez is not on medications currently.  ? ?Assessment/Plan:  ? ?1. Vitamin D deficiency ?Low Vitamin D level contributes to fatigue and are associated with obesity, breast, and colon cancer. Sherry Marquez changed Vitamin D from weekly to every 14 days. We will refill prescription Vitamin D 50,000 IU every 14 days for 1 month with no refills and Sherry Marquez will follow-up for routine testing of Vitamin D, at least 2-3 times per year to avoid over-replacement. ? ?- Vitamin D, Ergocalciferol, (DRISDOL) 1.25 MG (50000 UNIT) CAPS capsule; Take 1 capsule (50,000 Units total) by mouth every 14 (fourteen) days.  Dispense: 4 capsule; Refill: 0 ? ?2. Dyslipidemia ?Cardiovascular risk and specific lipid/LDL goals reviewed.  Sherry Marquez will consider to have CT calcium score with primary care physician. We discussed several lifestyle modifications today and Sherry Marquez will continue to work on diet, exercise and weight loss efforts. Orders and follow up as documented in patient record.  ? ?Counseling ?Intensive lifestyle modifications are the first line treatment for this issue. ?Dietary changes: Increase soluble fiber. Decrease simple carbohydrates. ?Exercise changes: Moderate to vigorous-intensity aerobic activity  150 minutes per week if tolerated. ?Lipid-lowering medications: see documented in medical record. ? ?3. Obesity, current BMI 33.1 ?Sherry Marquez is currently in the action stage of change. As such, her goal is to continue with weight loss efforts. She has agreed to the Category 3 Plan.  ? ?Sherry Marquez will continue meal planning and she will be mindful eating. We reviewed labs from 09/20/2021 CMP, Lipid, Vitamin D, A1C and insulin.  ? ?Exercise goals: No exercise has been prescribed at this time. ? ?Behavioral modification strategies: increasing lean protein intake, decreasing simple carbohydrates, increasing vegetables, increasing water intake, decreasing eating out, no skipping meals, meal planning and cooking strategies, keeping healthy foods in the home, and planning for success. ? ?Sherry Marquez has agreed to follow-up with our clinic in 3 weeks. She was informed of the importance of frequent follow-up visits to maximize her success with intensive lifestyle modifications for her multiple health conditions.  ? ?Objective:  ? ?Blood pressure 138/84, pulse 67, temperature 97.7 ?F (36.5 ?C), height 5' 6"  (1.676 m), weight 205 lb (93 kg), SpO2 98 %. ?Body mass index is 33.09 kg/m?. ? ?General: Cooperative, alert, well developed, in no acute distress. ?HEENT: Conjunctivae and lids unremarkable. ?Cardiovascular: Regular rhythm.  ?Lungs: Normal work of breathing. ?Neurologic: No focal deficits.  ? ?Lab Results  ?Component Value Date  ? CREATININE 0.84 09/20/2021  ? BUN 21 09/20/2021  ? NA 140 09/20/2021  ? K 5.1 09/20/2021  ? CL 102 09/20/2021  ? CO2 23 09/20/2021  ? ?Lab Results  ?Component Value Date  ? ALT 32 09/20/2021  ? AST 28 09/20/2021  ? ALKPHOS 102  09/20/2021  ? BILITOT 0.4 09/20/2021  ? ?Lab Results  ?Component Value Date  ? HGBA1C 5.5 09/20/2021  ? HGBA1C 5.7 02/24/2021  ? HGBA1C 5.7 10/29/2013  ? ?Lab Results  ?Component Value Date  ? INSULIN 9.4 09/20/2021  ? INSULIN 12.2 04/28/2021  ? ?Lab Results  ?Component Value Date  ? TSH  2.340 04/28/2021  ? ?Lab Results  ?Component Value Date  ? CHOL 249 (H) 09/20/2021  ? HDL 49 09/20/2021  ? LDLCALC 184 (H) 09/20/2021  ? TRIG 90 09/20/2021  ? CHOLHDL 4 02/24/2021  ? ?Lab Results  ?Component Value Date  ? VD25OH 42.9 09/20/2021  ? VD25OH 22.1 (L) 04/28/2021  ? ?Lab Results  ?Component Value Date  ? WBC 4.6 02/24/2021  ? HGB 15.0 02/24/2021  ? HCT 44.0 02/24/2021  ? MCV 89.8 02/24/2021  ? PLT 241.0 02/24/2021  ? ?Lab Results  ?Component Value Date  ? FERRITIN 58 09/05/2020  ? ?Attestation Statements:  ? ?Reviewed by clinician on day of visit: allergies, medications, problem list, medical history, surgical history, family history, social history, and previous encounter notes. ? ?I, Lizbeth Bark, RMA, am acting as transcriptionist for [employer], DO. ? ?I have reviewed the above documentation for accuracy and completeness, and I agree with the above. Jearld Lesch, DO ? ?

## 2021-10-25 ENCOUNTER — Other Ambulatory Visit (HOSPITAL_COMMUNITY)
Admission: RE | Admit: 2021-10-25 | Discharge: 2021-10-25 | Disposition: A | Discharge status: Home / Self Care | Payer: 59 | Source: Ambulatory Visit | Attending: Nurse Practitioner | Admitting: Nurse Practitioner

## 2021-10-25 ENCOUNTER — Encounter: Payer: Self-pay | Admitting: Nurse Practitioner

## 2021-10-25 ENCOUNTER — Ambulatory Visit (INDEPENDENT_AMBULATORY_CARE_PROVIDER_SITE_OTHER): Payer: 59 | Admitting: Nurse Practitioner

## 2021-10-25 ENCOUNTER — Other Ambulatory Visit: Payer: Self-pay

## 2021-10-25 VITALS — BP 120/74 | Ht 65.5 in | Wt 208.0 lb

## 2021-10-25 DIAGNOSIS — Z01419 Encounter for gynecological examination (general) (routine) without abnormal findings: Secondary | ICD-10-CM

## 2021-10-25 NOTE — Progress Notes (Signed)
? ?  MEGA KINKADE 12-15-1970 [id number] ? ? ?History:  51 y.o. [id number] presents for annual exam. Perimenopausal with cycles ranging every 2-4 months. Denies menopausal symptoms. Normal pap and mammogram history. 7 cm right ovarian cystectomy 05/2020, repeat ultrasound showed 2 cm cyst present. 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 She is down 30 pounds with diet, patient at [facility] weight management center. Depression managed by behavioral health.  ? ?Gynecologic History ?Patient's last menstrual period was 08/22/2021. ?Period Duration (Days): 14 ?Period Pattern: (!) Irregular ?Menstrual Flow: Moderate, Heavy ?Contraception/Family planning: vasectomy ?Sexually active: Yes ? ?Health Maintenance ?Last Pap: 09/14/2016. Results were: Normal ?Last mammogram: 10/14/2021. Results were: Normal ?Last colonoscopy: Never. Negative Cologuard 09/22/2021 ?Last Dexa: Never ? ?Past medical history, past surgical history, family history and social history were all reviewed and documented in the EPIC chart. Married. 2 sons ages 73 and 12. Significant family history of heart disease.  ? ?ROS:  A ROS was performed and pertinent positives and negatives are included. ? ?Exam: ? ?Vitals:  ? 10/25/21 0930  ?BP: 120/74  ?Weight: 208 lb (94.3 kg)  ?Height: 5' 5.5" (1.664 m)  ? ? ?Body mass index is 34.09 kg/m?. ? ?General appearance:  Normal ?Thyroid:  Symmetrical, normal in size, without palpable masses or nodularity. ?Respiratory ? Auscultation:  Clear without wheezing or rhonchi ?Cardiovascular ? Auscultation:  Regular rate, without rubs, murmurs or gallops ? Edema/varicosities:  Not grossly evident ?Abdominal ? Soft,nontender, without masses, guarding or rebound. ? Liver/spleen:  No organomegaly noted ? Hernia:  None appreciated ? Skin ? Inspection:  Grossly normal ?  ?Breasts: Examined lying and sitting.  ? Right: Without masses, retractions, discharge or axillary adenopathy. ? ? Left: Without masses, retractions, discharge or axillary  adenopathy. ?Genitourinary  ? Inguinal/mons:  Normal without inguinal adenopathy ? External genitalia:  Normal appearing vulva with no masses, tenderness, or lesions ? BUS/Urethra/Skene's glands:  Normal ? Vagina:  Normal appearing with normal color and discharge, no lesions ? Cervix:  Normal appearing without discharge or lesions ? Uterus:  Normal in size, shape and contour.  Midline and mobile, nontender ? Adnexa/parametria:   ?  Rt: Normal in size, without masses or tenderness. ?  Lt: Normal in size, without masses or tenderness. ? Anus and perineum: Normal ? Digital rectal exam: Normal sphincter tone without palpated masses or tenderness ? ?Patient informed chaperone available to be present for breast and pelvic exam. Patient has requested no chaperone to be present. Patient has been advised what will be completed during breast and pelvic exam.  ? ?Assessment/Plan:  51 y.o. [id number] for annual exam.  ? ?Well female exam with routine gynecological exam - Education provided on SBEs, importance of preventative screenings, current guidelines, high calcium diet, regular exercise, and multivitamin daily. Labs with PCP in February. Congratulated on weight loss.  ? ?Perimenopause - Cycles range from every 2-4 months. Denies menopausal symptoms. We discussed what to expect during this transition.  ? ?Screening for cervical cancer - Normal Pap history. Pap due today. ? ?Screening for breast cancer - Normal mammogram history.  Continue annual screenings.  Normal breast exam today. ? ?Screening for colon cancer - Negative Cologuard 09/2021.  ? ?Return in 1 year for annual.  ? ? ? ?Tamela Gammon DNP, 9:43 AM 10/25/2021 ? ?

## 2021-10-27 LAB — CYTOLOGY - PAP
Comment: NEGATIVE
Diagnosis: NEGATIVE
High risk HPV: NEGATIVE

## 2021-11-05 ENCOUNTER — Ambulatory Visit: Payer: 59 | Admitting: Psychology

## 2021-11-05 DIAGNOSIS — F4321 Adjustment disorder with depressed mood: Secondary | ICD-10-CM

## 2021-11-05 NOTE — Progress Notes (Signed)
[place] Counselor/Therapist Progress Note ? ?Patient ID: Sherry Marquez, MRN: [id number],   ? ?Date: 11/05/2021 ? ?Time Spent: 12:00pm-12:55pm    55 minutes  ? ?Treatment Type: Individual Therapy ? ?Reported Symptoms: stress ? ?Mental Status Exam: ?Appearance:  Neat     ?Behavior: Appropriate  ?Motor: Normal  ?Speech/Language:  Normal Rate  ?Affect: Appropriate  ?Mood: normal  ?Thought process: normal  ?Thought content:   WNL  ?Sensory/Perceptual disturbances:   WNL  ?Orientation: oriented to person, place, and time/date  ?Attention: Good  ?Concentration: Good  ?Memory: WNL  ?Fund of knowledge:  Good  ?Insight:   Good  ?Judgment:  Good  ?Impulse Control: Good  ? ?Risk Assessment: ?Danger to Self:  No ?Self-injurious Behavior: No ?Danger to Others: No ?Duty to Warn:no ?Physical Aggression / Violence:No  ?Access to Firearms a concern: No  ?Gang Involvement:No  ? ?Subjective:   ?Pt present for face-to-face individual therapy via video Webex.  Pt consents to telehealth video session due to COVID 19 pandemic. ?Location of pt: home  ?Location of therapist: home office.   ?Pt talked about having a busy morning.   ?Her uncle is in comfort care and is dying.  Addressed pt's feelings.   ?Pt is feeling overwhelmed and like her "thoughts are swirling".   ?Pt has been staying on track with her food plan and is taking walks with her new puppy.   ?Pt is feeling more confident about her health choices regarding her diet.   ?Pt talked about her interest in personality profiling.  She has taken personality tests as a way to understand herself and others better.  ?Pt talked about having few social connections.  Her family tend to be introverts but she would like to have a little more connection with people than she does.   Addressed how pt can increase social connections.    ?Provided supportive therapy.   ? ?Interventions: Cognitive Behavioral Therapy and Grief Therapy ? 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 ?Plan: See pt's  Treatment plan in Therapy Charts.  (Treatment Plan target date: 06/27/2022)  ?Pt is progressing toward treatment goals.   ?Pt wants a safe place to talk and to improve coping skills.  Pt would like to work on figuring out her own opinions and to communicate them.  Pt would like to increase confidence and self esteem.  Pt wants to learn to be assertive.    ?Plan to continue to meet every two weeks.   ? ?Carlin Attridge, LCSW ? ? ?

## 2021-11-16 ENCOUNTER — Ambulatory Visit (INDEPENDENT_AMBULATORY_CARE_PROVIDER_SITE_OTHER): Payer: 59 | Admitting: Bariatrics

## 2021-11-16 ENCOUNTER — Encounter (INDEPENDENT_AMBULATORY_CARE_PROVIDER_SITE_OTHER): Payer: Self-pay | Admitting: Bariatrics

## 2021-11-16 VITALS — BP 119/81 | HR 61 | Temp 97.9°F | Ht 66.0 in | Wt 203.0 lb

## 2021-11-16 DIAGNOSIS — E669 Obesity, unspecified: Secondary | ICD-10-CM | POA: Diagnosis not present

## 2021-11-16 DIAGNOSIS — R7303 Prediabetes: Secondary | ICD-10-CM | POA: Diagnosis not present

## 2021-11-16 DIAGNOSIS — E785 Hyperlipidemia, unspecified: Secondary | ICD-10-CM

## 2021-11-16 DIAGNOSIS — Z6832 Body mass index (BMI) 32.0-32.9, adult: Secondary | ICD-10-CM | POA: Diagnosis not present

## 2021-11-18 ENCOUNTER — Encounter (INDEPENDENT_AMBULATORY_CARE_PROVIDER_SITE_OTHER): Payer: Self-pay | Admitting: Bariatrics

## 2021-11-18 NOTE — Progress Notes (Signed)
? ? ? ?Chief Complaint:  ? ?OBESITY ?Glennice is here to discuss her progress with her obesity treatment plan along with follow-up of her obesity related diagnoses. Teresina is on the Category 3 Plan and states she is following her eating plan approximately 80% of the time. Harmoni states she is walking for 15-20 minutes 5 times per week. ? ?Today's visit was #: 12 ?Starting weight: 232 lbs ?Starting date: 04/28/2021 ?Today's weight: 203 lbs ?Today's date: 11/16/2021 ?Total lbs lost to date: 29 lbs ?Total lbs lost since last in-office visit: 2 lbs ? ?Interim History: Jaleiah is down an additional 2 lbs and doing well overall.  ? ?Subjective:  ? ?1. Dyslipidemia ?Shayleen is not on medications currently.  ? ?2. Pre-diabetes ?Amilia is currently not on medications.  ? ?Assessment/Plan:  ? ?1. Dyslipidemia ?Cardiovascular risk and specific lipid/LDL goals reviewed.  Tanise will read labels. She will have no trans fats. She will minimize saturated fats excepts unprocessed meat and dairy. We discussed several lifestyle modifications today and Sherrelle will continue to work on diet, exercise and weight loss efforts. Orders and follow up as documented in patient record.  ? ?Counseling ?Intensive lifestyle modifications are the first line treatment for this issue. ?Dietary changes: Increase soluble fiber. Decrease simple carbohydrates. ?Exercise changes: Moderate to vigorous-intensity aerobic activity 150 minutes per week if tolerated. ?Lipid-lowering medications: see documented in medical record. ? ?2. Pre-diabetes ?Gaynell will minimize sweets and starches. She will continue to work on weight loss, exercise, and decreasing simple carbohydrates to help decrease the risk of diabetes.  ? ?3. Obesity, current BMI 32.8 ?Everlean is currently in the action stage of change. As such, her goal is to continue with weight loss efforts. She has agreed to the Category 3 Plan.  ? ?Jimi will continue meal planning and she will continue intentional eating. She  will add in healthier fats for desserts.  ? ?Exercise goals:  As is. She has started doing yardwork . ? ?Behavioral modification strategies: increasing lean protein intake, decreasing simple carbohydrates, increasing vegetables, increasing water intake, decreasing eating out, no skipping meals, meal planning and cooking strategies, keeping healthy foods in the home, and planning for success. ? ?Aloma has agreed to follow-up with our clinic in 3 weeks with nurse practitioner and 6 weeks with myself. She was informed of the importance of frequent follow-up visits to maximize her success with intensive lifestyle modifications for her multiple health conditions.  ? ?Objective:  ? ?Blood pressure 119/81, pulse 61, temperature 97.9 ?F (36.6 ?C), height 5' 6"  (1.676 m), weight 203 lb (92.1 kg). ?Body mass index is 32.77 kg/m?. ? ?General: Cooperative, alert, well developed, in no acute distress. ?HEENT: Conjunctivae and lids unremarkable. ?Cardiovascular: Regular rhythm.  ?Lungs: Normal work of breathing. ?Neurologic: No focal deficits.  ? ?Lab Results  ?Component Value Date  ? CREATININE 0.84 09/20/2021  ? BUN 21 09/20/2021  ? NA 140 09/20/2021  ? K 5.1 09/20/2021  ? CL 102 09/20/2021  ? CO2 23 09/20/2021  ? ?Lab Results  ?Component Value Date  ? ALT 32 09/20/2021  ? AST 28 09/20/2021  ? ALKPHOS 102 09/20/2021  ? BILITOT 0.4 09/20/2021  ? ?Lab Results  ?Component Value Date  ? HGBA1C 5.5 09/20/2021  ? HGBA1C 5.7 02/24/2021  ? HGBA1C 5.7 10/29/2013  ? ?Lab Results  ?Component Value Date  ? INSULIN 9.4 09/20/2021  ? INSULIN 12.2 04/28/2021  ? ?Lab Results  ?Component Value Date  ? TSH 2.340 04/28/2021  ? ?Lab Results  ?  Component Value Date  ? CHOL 249 (H) 09/20/2021  ? HDL 49 09/20/2021  ? LDLCALC 184 (H) 09/20/2021  ? TRIG 90 09/20/2021  ? CHOLHDL 4 02/24/2021  ? ?Lab Results  ?Component Value Date  ? VD25OH 42.9 09/20/2021  ? VD25OH 22.1 (L) 04/28/2021  ? ?Lab Results  ?Component Value Date  ? WBC 4.6 02/24/2021  ? HGB  15.0 02/24/2021  ? HCT 44.0 02/24/2021  ? MCV 89.8 02/24/2021  ? PLT 241.0 02/24/2021  ? ?Lab Results  ?Component Value Date  ? FERRITIN 58 09/05/2020  ? ?Attestation Statements:  ? ?Reviewed by clinician on day of visit: allergies, medications, problem list, medical history, surgical history, family history, social history, and previous encounter notes. ? ?I, Lizbeth Bark, RMA, am acting as transcriptionist for [employer], DO. ? ?I have reviewed the above documentation for accuracy and completeness, and I agree with the above. Jearld Lesch, DO ? ?

## 2021-11-22 ENCOUNTER — Ambulatory Visit: Payer: 59 | Admitting: Psychology

## 2021-11-22 DIAGNOSIS — F4321 Adjustment disorder with depressed mood: Secondary | ICD-10-CM

## 2021-11-22 NOTE — Progress Notes (Signed)
[place] Counselor/Therapist Progress Note ? ?Patient ID: Sherry Marquez, MRN: [id number],   ? ?Date: 11/22/2021 ? ?Time Spent: 10:00am - 10:55am    55 minutes  ? ?Treatment Type: Individual Therapy ? ?Reported Symptoms: stress, sadness ? ?Mental Status Exam: ?Appearance:  Neat     ?Behavior: Appropriate  ?Motor: Normal  ?Speech/Language:  Normal Rate  ?Affect: Appropriate  ?Mood: normal  ?Thought process: normal  ?Thought content:   WNL  ?Sensory/Perceptual disturbances:   WNL  ?Orientation: oriented to person, place, and time/date  ?Attention: Good  ?Concentration: Good  ?Memory: WNL  ?Fund of knowledge:  Good  ?Insight:   Good  ?Judgment:  Good  ?Impulse Control: Good  ? ?Risk Assessment: ?Danger to Self:  No ?Self-injurious Behavior: No ?Danger to Others: No ?Duty to Warn:no ?Physical Aggression / Violence:No  ?Access to Firearms a concern: No  ?Gang Involvement:No  ? ?Subjective:   ?Pt present for face-to-face individual therapy via video Webex.  Pt consents to telehealth video session due to COVID 19 pandemic. ?Location of pt: home  ?Location of therapist: home office.   ?Pt talked about her uncle passing away a couple of weeks ago.  They will be going to the [facility] in [place] later this week.  Addressed pt's feelings and grief as well as the stress of traveling.   Pt feels nervous about the trip and seeing family that she has not seen for awhile.   Pt states her mother is the only sibling left of her family and pt thinks about how much longer she has with her parents who are 60 years old.   ?Pt talked about "feeling overwhelmed with life".   She is behind on paperwork and some life chores.  Addressed pt's overwhelm and worked on Child psychotherapist.   ?Pt talked about the stress of training and caring for her puppy.   Identified that pt is having a lot of perfectionistic thoughts about expectations of herself and the puppy.   Addressed pt's expectations and worked on thought reframing.       ?Provided supportive therapy.   ? ?Interventions: Cognitive Behavioral Therapy and Grief Therapy ? 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 ?Plan: See pt's Treatment plan in Therapy Charts.  (Treatment Plan target date: 06/27/2022)  ?Pt is progressing toward treatment goals.   ?Pt wants a safe place to talk and to improve coping skills.  Pt would like to work on figuring out her own opinions and to communicate them.  Pt would like to increase confidence and self esteem.  Pt wants to learn to be assertive.    ?Plan to continue to meet every two weeks.   ? ?Quilla Freeze, LCSW ? ? ? ?

## 2021-11-27 ENCOUNTER — Other Ambulatory Visit (INDEPENDENT_AMBULATORY_CARE_PROVIDER_SITE_OTHER): Payer: Self-pay | Admitting: Bariatrics

## 2021-11-27 DIAGNOSIS — E559 Vitamin D deficiency, unspecified: Secondary | ICD-10-CM

## 2021-12-01 ENCOUNTER — Ambulatory Visit (INDEPENDENT_AMBULATORY_CARE_PROVIDER_SITE_OTHER): Payer: 59 | Admitting: Family Medicine

## 2021-12-01 ENCOUNTER — Encounter (INDEPENDENT_AMBULATORY_CARE_PROVIDER_SITE_OTHER): Payer: Self-pay | Admitting: Family Medicine

## 2021-12-01 VITALS — BP 113/70 | HR 58 | Temp 97.9°F | Ht 66.0 in | Wt 204.0 lb

## 2021-12-01 DIAGNOSIS — E669 Obesity, unspecified: Secondary | ICD-10-CM

## 2021-12-01 DIAGNOSIS — Z6833 Body mass index (BMI) 33.0-33.9, adult: Secondary | ICD-10-CM

## 2021-12-01 DIAGNOSIS — E559 Vitamin D deficiency, unspecified: Secondary | ICD-10-CM

## 2021-12-03 ENCOUNTER — Ambulatory Visit: Payer: 59 | Admitting: Family Medicine

## 2021-12-03 ENCOUNTER — Encounter: Payer: Self-pay | Admitting: Family Medicine

## 2021-12-03 VITALS — BP 129/79 | HR 56 | Temp 98.5°F | Wt 211.8 lb

## 2021-12-03 DIAGNOSIS — R350 Frequency of micturition: Secondary | ICD-10-CM | POA: Diagnosis not present

## 2021-12-03 LAB — POCT URINALYSIS DIPSTICK
Bilirubin, UA: NEGATIVE
Glucose, UA: NEGATIVE
Ketones, UA: NEGATIVE
Leukocytes, UA: NEGATIVE
Nitrite, UA: NEGATIVE
Protein, UA: NEGATIVE
Spec Grav, UA: 1.025 (ref 1.010–1.025)
Urobilinogen, UA: NEGATIVE E.U./dL — AB
pH, UA: 6 (ref 5.0–8.0)

## 2021-12-03 NOTE — Progress Notes (Signed)
Subjective:  ? ? Patient ID: Sherry Marquez, female    DOB: Jan 06, 1971, 51 y.o.   MRN: [id number] ? ?Chief Complaint  ?Patient presents with  ? Urinary Tract Infection  ?  Frequent urination, sharp pains intermittent in lowe abdomen. Took some cranberry pills.   ? ? ?HPI ?Patient was seen today for concern.  Patient endorses lower abdominal irritation, sharp pain/dull ache and frequency.  Patient states she took an at home urine test which recommended retesting tomorrow.  Patient notes returning to standing from a long car trip to [place].  Patient states she was drinking less water during trip and holding urine while driving.  Patient denies nausea, vomiting, fever, chills, constipation, increased back pain.  Typically drinks 90 ounces of water per day.  Also unsure sensation was 2/2 being at the end of menses. ? ? ?Past Medical History:  ?Diagnosis Date  ? Allergy   ? Anxiety   ? Back pain   ? COVID   ? Diabetes ([place])   ? Gestational  ? Eczema   ? Environmental and seasonal allergies   ? High cholesterol   ? History of gestational diabetes   ? Hx of blood clots   ? Joint pain   ? Lack of energy   ? LUMBAR SPRAIN AND STRAIN 12/05/2009  ? Qualifier: Diagnosis of  By: Sarajane Jews MD, Ishmael Holter   ? Obesity   ? Osteoarthritis   ? Pneumonia   ? Right ovarian cyst   ? Rosacea   ? SOB (shortness of breath)   ? ? ?Allergies  ?Allergen Reactions  ? Cefotan [Cefotetan] Rash  ?  Developed rash during IV infusion- dose completed -benadryl given, no other issues occured  ? ? ?ROS ?General: Denies fever, chills, night sweats, changes in weight, changes in appetite ?HEENT: Denies headaches, ear pain, changes in vision, rhinorrhea, sore throat ?CV: Denies CP, palpitations, SOB, orthopnea ?Pulm: Denies SOB, cough, wheezing ?GI: Denies abdominal pain, nausea, vomiting, diarrhea, constipation +lower abd irritation,dull ache/sharp pain ?GU: Denies dysuria, hematuria, frequency, vaginal discharge +frequency ?Msk: Denies muscle cramps, joint  pains ?Neuro: Denies weakness, numbness, tingling ?Skin: Denies rashes, bruising ?Psych: Denies depression, anxiety, hallucination ?   ?Objective:  ?  ?Blood pressure 129/79, pulse (!) 56, temperature 98.5 ?F (36.9 ?C), temperature source Oral, weight 211 lb 12.8 oz (96.1 kg), SpO2 100 %. ? ?Gen. Pleasant, well-nourished, in no distress, normal affect   ?HEENT: [place]/AT, face symmetric, conjunctiva clear, no scleral icterus, PERRLA, EOMI, nares patent without drainage ?Lungs: no accessory muscle use ?Cardiovascular: RRR, no peripheral edema ?Abdomen: BS present, soft, mild TTP suprapubic area, ND, no hepatosplenomegaly. ?Neuro:  A&Ox3, CN II-XII intact, normal gait ? ? ? ?Wt Readings from Last 3 Encounters:  ?12/03/21 211 lb 12.8 oz (96.1 kg)  ?12/01/21 204 lb (92.5 kg)  ?11/16/21 203 lb (92.1 kg)  ? ? ?Lab Results  ?Component Value Date  ? WBC 4.6 02/24/2021  ? HGB 15.0 02/24/2021  ? HCT 44.0 02/24/2021  ? PLT 241.0 02/24/2021  ? GLUCOSE 105 (H) 09/20/2021  ? CHOL 249 (H) 09/20/2021  ? TRIG 90 09/20/2021  ? HDL 49 09/20/2021  ? LDLCALC 184 (H) 09/20/2021  ? ALT 32 09/20/2021  ? AST 28 09/20/2021  ? NA 140 09/20/2021  ? K 5.1 09/20/2021  ? CL 102 09/20/2021  ? CREATININE 0.84 09/20/2021  ? BUN 21 09/20/2021  ? CO2 23 09/20/2021  ? TSH 2.340 04/28/2021  ? HGBA1C 5.5 09/20/2021  ? ? ?  Assessment/Plan: ? ?Urinary frequency ?-Discussed possible causes including UTI ?-UA with 1+ RBCs likely 2/2 ongoing menses.  SG 1.020 ?-Discussed increasing intake of fluids.  OTC Azo if needed. ?-Obtain urine culture.  Further recommendations such as abx based on cx results ?-Given precautions ? - Plan: POCT urinalysis dipstick, Culture, Urine ? ?F/u prn ? ?Grier Mitts, MD ?

## 2021-12-04 LAB — URINE CULTURE
MICRO NUMBER:: 13326627
SPECIMEN QUALITY:: ADEQUATE

## 2021-12-06 ENCOUNTER — Other Ambulatory Visit: Payer: Self-pay | Admitting: Family Medicine

## 2021-12-06 DIAGNOSIS — N3000 Acute cystitis without hematuria: Secondary | ICD-10-CM

## 2021-12-06 MED ORDER — AMOXICILLIN 500 MG PO TABS: 500.0000 mg | ORAL_TABLET | Freq: Two times a day (BID) | ORAL | 0 refills | Status: AC

## 2021-12-07 ENCOUNTER — Telehealth: Payer: Self-pay | Admitting: Family Medicine

## 2021-12-07 NOTE — Telephone Encounter (Addendum)
Spoke with the patient and informed her of the urine culture results. ?

## 2021-12-07 NOTE — Telephone Encounter (Signed)
Pt is returning jaton call concerning lab results ?

## 2021-12-07 NOTE — Telephone Encounter (Signed)
See result note.  

## 2021-12-09 ENCOUNTER — Ambulatory Visit: Payer: 59 | Admitting: Psychology

## 2021-12-09 DIAGNOSIS — F4321 Adjustment disorder with depressed mood: Secondary | ICD-10-CM | POA: Diagnosis not present

## 2021-12-09 NOTE — Progress Notes (Signed)
[place] Counselor/Therapist Progress Note ? ?Patient ID: Sherry Marquez, MRN: [id number],   ? ?Date: 12/09/2021 ? ?Time Spent: 11:00am - 11:55am    55 minutes  ? ?Treatment Type: Individual Therapy ? ?Reported Symptoms: stress, sadness ? ?Mental Status Exam: ?Appearance:  Neat     ?Behavior: Appropriate  ?Motor: Normal  ?Speech/Language:  Normal Rate  ?Affect: Appropriate  ?Mood: normal  ?Thought process: normal  ?Thought content:   WNL  ?Sensory/Perceptual disturbances:   WNL  ?Orientation: oriented to person, place, and time/date  ?Attention: Good  ?Concentration: Good  ?Memory: WNL  ?Fund of knowledge:  Good  ?Insight:   Good  ?Judgment:  Good  ?Impulse Control: Good  ? ?Risk Assessment: ?Danger to Self:  No ?Self-injurious Behavior: No ?Danger to Others: No ?Duty to Warn:no ?Physical Aggression / Violence:No  ?Access to Firearms a concern: No  ?Gang Involvement:No  ? ?Subjective:   ?Pt present for face-to-face individual therapy via video Webex.  Pt consents to telehealth video session due to COVID 19 pandemic. ?Location of pt: home  ?Location of therapist: home office.   ?Pt talked about the family trip to her [employer].  Pt states it was healing to go to the service and she was glad to see family.  Family was not upset with her like she had worried that they were.  Pt and her husband had a disagreement while there.  Addressed the issues and how they communicated.   ?Pt is feeling overwhelmed with puppy care and house things.   ?Pt talked about her relationship with her brother which is strained.  Addressed the relationship dynamics.  Pt tends to worry about things and write narratives in her head that may not be true.   Worked on thought reframing.       ?Provided supportive therapy.   ? ?Interventions: Cognitive Behavioral Therapy and Grief Therapy ? 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 ?Plan: See pt's Treatment plan in Therapy Charts.  (Treatment Plan target date: 06/27/2022)  ?Pt is  progressing toward treatment goals.   ?Pt wants a safe place to talk and to improve coping skills.  Pt would like to work on figuring out her own opinions and to communicate them.  Pt would like to increase confidence and self esteem.  Pt wants to learn to be assertive.    ?Plan to continue to meet every two weeks.   ? ?Lucah Petta, LCSW ? ? ? ?

## 2021-12-15 NOTE — Progress Notes (Signed)
Chief Complaint:   OBESITY Sherry Marquez is here to discuss her progress with her obesity treatment plan along with follow-up of her obesity related diagnoses. Sherry Marquez is on the Category 3 Plan and states she is following her eating plan approximately 85% of the time. Sherry Marquez states she is doing 0 minutes 0 times per week.  Today's visit was #: 74 Starting weight: 232 lbs Starting date: 04/28/2021 Today's weight: 204 lbs Today's date: 12/01/2021 Total lbs lost to date: 28 Total lbs lost since last in-office visit: 0  Interim History: Sherry Marquez has done well with maintaining her weight despite traveling recently. She notes she does better when she is on her routine. She is open to looking at other eating plans especially for lunch.    Subjective:   1. Vitamin D deficiency Sherry Marquez is on Vitamin D, and last level was not at goal. She is now taking Vitamin D every 2 weeks. No side effects were noted.   Assessment/Plan:   1. Vitamin D deficiency Sherry Marquez will continue Vitamin D prescription every 2 weeks. We will recheck labs in 1 month.   2. Obesity, current BMI 33.0 Sherry Marquez is currently in the action stage of change. As such, her goal is to continue with weight loss efforts. She has agreed to the Category 3 Plan and keeping a food journal and adhering to recommended goals of 350-500 calories and 35 grams of protein at lunch daily.   Behavioral modification strategies: increasing lean protein intake.  Cala has agreed to follow-up with our clinic in 4 weeks. She was informed of the importance of frequent follow-up visits to maximize her success with intensive lifestyle modifications for her multiple health conditions.   Objective:   Blood pressure 113/70, pulse (!) 58, temperature 97.9 F (36.6 C), height 5' 6"  (1.676 m), weight 204 lb (92.5 kg), SpO2 99 %. Body mass index is 32.93 kg/m.  General: Cooperative, alert, well developed, in no acute distress. HEENT: Conjunctivae and lids  unremarkable. Cardiovascular: Regular rhythm.  Lungs: Normal work of breathing. Neurologic: No focal deficits.   Lab Results  Component Value Date   CREATININE 0.84 09/20/2021   BUN 21 09/20/2021   NA 140 09/20/2021   K 5.1 09/20/2021   CL 102 09/20/2021   CO2 23 09/20/2021   Lab Results  Component Value Date   ALT 32 09/20/2021   AST 28 09/20/2021   ALKPHOS 102 09/20/2021   BILITOT 0.4 09/20/2021   Lab Results  Component Value Date   HGBA1C 5.5 09/20/2021   HGBA1C 5.7 02/24/2021   HGBA1C 5.7 10/29/2013   Lab Results  Component Value Date   INSULIN 9.4 09/20/2021   INSULIN 12.2 04/28/2021   Lab Results  Component Value Date   TSH 2.340 04/28/2021   Lab Results  Component Value Date   CHOL 249 (H) 09/20/2021   HDL 49 09/20/2021   LDLCALC 184 (H) 09/20/2021   TRIG 90 09/20/2021   CHOLHDL 4 02/24/2021   Lab Results  Component Value Date   VD25OH 42.9 09/20/2021   VD25OH 22.1 (L) 04/28/2021   Lab Results  Component Value Date   WBC 4.6 02/24/2021   HGB 15.0 02/24/2021   HCT 44.0 02/24/2021   MCV 89.8 02/24/2021   PLT 241.0 02/24/2021   Lab Results  Component Value Date   FERRITIN 58 09/05/2020   Attestation Statements:   Reviewed by clinician on day of visit: allergies, medications, problem list, medical history, surgical history, family history, social history,  and previous encounter notes.  Time spent on visit including pre-visit chart review and post-visit care and charting was 30 minutes.    I, Trixie Dredge, am acting as transcriptionist for Dennard Nip, MD.  I have reviewed the above documentation for accuracy and completeness, and I agree with the above. -  Dennard Nip, MD

## 2021-12-24 ENCOUNTER — Ambulatory Visit: Payer: 59 | Admitting: Psychology

## 2021-12-24 DIAGNOSIS — F4321 Adjustment disorder with depressed mood: Secondary | ICD-10-CM | POA: Diagnosis not present

## 2021-12-24 NOTE — Progress Notes (Signed)
[place] Counselor/Therapist Progress Note  Patient ID: Sherry Marquez, MRN: [id number],    Date: 12/24/2021  Time Spent: 10:00am - 10:55am    55 minutes   Treatment Type: Individual Therapy  Reported Symptoms: stress  Mental Status Exam: Appearance:  Neat     Behavior: Appropriate  Motor: Normal  Speech/Language:  Normal Rate  Affect: Appropriate  Mood: normal  Thought process: normal  Thought content:   WNL  Sensory/Perceptual disturbances:   WNL  Orientation: oriented to person, place, and time/date  Attention: Good  Concentration: Good  Memory: WNL  Fund of knowledge:  Good  Insight:   Good  Judgment:  Good  Impulse Control: Good   Risk Assessment: Danger to Self:  No Self-injurious Behavior: No Danger to Others: No Duty to Warn:no Physical Aggression / Violence:No  Access to Firearms a concern: No  Gang Involvement:No   Subjective:   Pt present for face-to-face individual therapy via video Webex.  Pt consents to telehealth video session due to COVID 19 pandemic. Location of pt: home  Location of therapist: home office.   Pt states she has been doing well overall.   She visited her parents for mother's day weekend.  She had a nice time with family.  Pt had felt anticipatory anxiety about going to see her family bc her husband does not travel well and pt tends to worry about how her husband will be and she tends to try to "make everyone happy".  Pt states both her husband and brother can tend to "suck the air out of the room" and have big personalities and can be self focused.  Addressed how this impacts pt and helped her process her feelings and relationship dynamics.       Pt has been making some progress with he puppy.  She is taking him on more walks and starts puppy training classes next week.   Pt is working on plans for the summer.  She is making plans for her sons and is feeling hopeful that it will be a better summer than last year.  Pt's  oldest son will get his license this summer and pt feels anxious about him driving independently.   Addressed pt's worries and worked on calming strategies.   Pt talked about her health and diet.  She has been eating more sweets.  Addressed what triggers her going off her food plan.  Pt wants to work on behavior and emotional issues related to over eating.  Encouraged pt to start journaling what she eats and her thoughts and feelings.   Provided supportive therapy.    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  (Treatment Plan target date: 06/27/2022)  Pt is progressing toward treatment goals.   Pt wants a safe place to talk and to improve coping skills.  Pt would like to work on figuring out her own opinions and to communicate them.  Pt would like to increase confidence and self esteem.  Pt wants to learn to be assertive.    Plan to continue to meet every two weeks.    Kirstina Leinweber, LCSW

## 2021-12-27 ENCOUNTER — Ambulatory Visit (INDEPENDENT_AMBULATORY_CARE_PROVIDER_SITE_OTHER): Payer: 59 | Admitting: Family Medicine

## 2021-12-27 ENCOUNTER — Encounter (INDEPENDENT_AMBULATORY_CARE_PROVIDER_SITE_OTHER): Payer: Self-pay | Admitting: Family Medicine

## 2021-12-27 VITALS — BP 133/86 | HR 61 | Temp 98.2°F | Ht 66.0 in | Wt 206.0 lb

## 2021-12-27 DIAGNOSIS — E559 Vitamin D deficiency, unspecified: Secondary | ICD-10-CM

## 2021-12-27 DIAGNOSIS — E7849 Other hyperlipidemia: Secondary | ICD-10-CM | POA: Diagnosis not present

## 2021-12-27 DIAGNOSIS — Z6833 Body mass index (BMI) 33.0-33.9, adult: Secondary | ICD-10-CM

## 2021-12-27 DIAGNOSIS — E669 Obesity, unspecified: Secondary | ICD-10-CM | POA: Diagnosis not present

## 2021-12-27 DIAGNOSIS — Z9189 Other specified personal risk factors, not elsewhere classified: Secondary | ICD-10-CM

## 2021-12-27 MED ORDER — VITAMIN D (ERGOCALCIFEROL) 1.25 MG (50000 UNIT) PO CAPS: 50000.0000 [IU] | ORAL_CAPSULE | ORAL | 0 refills | Status: DC

## 2022-01-03 DIAGNOSIS — E7849 Other hyperlipidemia: Secondary | ICD-10-CM | POA: Insufficient documentation

## 2022-01-03 DIAGNOSIS — E559 Vitamin D deficiency, unspecified: Secondary | ICD-10-CM | POA: Insufficient documentation

## 2022-01-03 DIAGNOSIS — Z9189 Other specified personal risk factors, not elsewhere classified: Secondary | ICD-10-CM | POA: Insufficient documentation

## 2022-01-03 NOTE — Progress Notes (Signed)
Chief Complaint:   OBESITY Sherry Marquez is here to discuss her progress with her obesity treatment plan along with follow-up of her obesity related diagnoses. Congetta is on the Category 3 Plan and keeping a food journal and adhering to recommended goals of 350-500 calories and ? protein and states she is following her eating plan approximately 75% of the time. Akshara states she is walking 15-20 minutes 5 times per week.  Today's visit was #: 14 Starting weight: 232 lbs Starting date: 04/28/2021 Today's weight: 206 lbs Today's date: 12/27/2021 Total lbs lost to date: 26 lbs Total lbs lost since last in-office visit: 0  Interim History: Sherry Marquez felt she was very successful earlier in the program.  She does have a significant amount of emotional eating that continues to be present that she is working on. Has not been journaling much and is starting to feel arduous following plan.   Subjective:   1. Vitamin D deficiency Dion is on prescription Vitamin D and last level was 42.9.  2. Other hyperlipidemia 10 year ASCVD risk of 1.9% (optimal 0.8%) Currently not on any medications.   3. At risk for depression Sherry Marquez is at elevated risk of depression due to one or more of the following: family history, significant life stressors, medical conditions and/or poor nutrition and due to familial stressors.   Assessment/Plan:   1. Vitamin D deficiency Refill Vitamin D, 50,000 IU every week #4, no refills.  - Vitamin D, Ergocalciferol, (DRISDOL) 1.25 MG (50000 UNIT) CAPS capsule; Take 1 capsule (50,000 Units total) by mouth every 14 (fourteen) days.  Dispense: 4 capsule; Refill: 0  2. Other hyperlipidemia Repeat labs in 1-2 months.  3. At risk for depression Sherry Marquez was given approximately 15 minutes of depression risk counseling today. She has risk factors for depression including familial stress. We discussed the importance of a healthy work life balance, a healthy relationship with food and a good  support system.  Repetitive spaced learning was employed today to elicit superior memory formation and behavioral change.   4. Obesity with current BMI of 33.3 Sherry Marquez is currently in the action stage of change. As such, her goal is to continue with weight loss efforts. She has agreed to the Category 3 Plan and keeping a food journal and adhering to recommended goals of 450-600 calories and 40 protein at supper.  Exercise goals:  As is.   Behavioral modification strategies: increasing lean protein intake, meal planning and cooking strategies, keeping healthy foods in the home, and planning for success.  Sherry Marquez has agreed to follow-up with our clinic in 3 weeks. She was informed of the importance of frequent follow-up visits to maximize her success with intensive lifestyle modifications for her multiple health conditions.   Objective:   Blood pressure 133/86, pulse 61, temperature 98.2 F (36.8 C), height 5' 6"  (1.676 m), weight 206 lb (93.4 kg), SpO2 97 %. Body mass index is 33.25 kg/m.  General: Cooperative, alert, well developed, in no acute distress. HEENT: Conjunctivae and lids unremarkable. Cardiovascular: Regular rhythm.  Lungs: Normal work of breathing. Neurologic: No focal deficits.   Lab Results  Component Value Date   CREATININE 0.84 09/20/2021   BUN 21 09/20/2021   NA 140 09/20/2021   K 5.1 09/20/2021   CL 102 09/20/2021   CO2 23 09/20/2021   Lab Results  Component Value Date   ALT 32 09/20/2021   AST 28 09/20/2021   ALKPHOS 102 09/20/2021   BILITOT 0.4 09/20/2021  Lab Results  Component Value Date   HGBA1C 5.5 09/20/2021   HGBA1C 5.7 02/24/2021   HGBA1C 5.7 10/29/2013   Lab Results  Component Value Date   INSULIN 9.4 09/20/2021   INSULIN 12.2 04/28/2021   Lab Results  Component Value Date   TSH 2.340 04/28/2021   Lab Results  Component Value Date   CHOL 249 (H) 09/20/2021   HDL 49 09/20/2021   LDLCALC 184 (H) 09/20/2021   TRIG 90 09/20/2021    CHOLHDL 4 02/24/2021   Lab Results  Component Value Date   VD25OH 42.9 09/20/2021   VD25OH 22.1 (L) 04/28/2021   Lab Results  Component Value Date   WBC 4.6 02/24/2021   HGB 15.0 02/24/2021   HCT 44.0 02/24/2021   MCV 89.8 02/24/2021   PLT 241.0 02/24/2021   Lab Results  Component Value Date   FERRITIN 58 09/05/2020    Attestation Statements:   Reviewed by clinician on day of visit: allergies, medications, problem list, medical history, surgical history, family history, social history, and previous encounter notes.  I, Davy Pique, RMA, am acting as transcriptionist for Coralie Common, MD.  I have reviewed the above documentation for accuracy and completeness, and I agree with the above. - Coralie Common, MD

## 2022-01-21 ENCOUNTER — Ambulatory Visit: Payer: 59 | Admitting: Psychology

## 2022-01-21 DIAGNOSIS — F4321 Adjustment disorder with depressed mood: Secondary | ICD-10-CM

## 2022-01-21 NOTE — Progress Notes (Signed)
[facility] Counselor/Therapist Progress Note  Patient ID: Sherry Marquez, MRN: [id number],    Date: 01/21/2022  Time Spent: 9:00am - 9:55am    55 minutes   Treatment Type: Individual Therapy  Reported Symptoms: stress, tearfulness  Mental Status Exam: Appearance:  Neat     Behavior: Appropriate  Motor: Normal  Speech/Language:  Normal Rate  Affect: Appropriate  Mood: normal  Thought process: normal  Thought content:   WNL  Sensory/Perceptual disturbances:   WNL  Orientation: oriented to person, place, and time/date  Attention: Good  Concentration: Good  Memory: WNL  Fund of knowledge:  Good  Insight:   Good  Judgment:  Good  Impulse Control: Good   Risk Assessment: Danger to Self:  No Self-injurious Behavior: No Danger to Others: No Duty to Warn:no Physical Aggression / Violence:No  Access to Firearms a concern: No  Gang Involvement:No   Subjective:   Pt present for face-to-face individual therapy via video Webex.  Pt consents to telehealth video session due to COVID 19 pandemic. Location of pt: home  Location of therapist: home office.   Pt talked about the challenges of the summer schedule with the lack of structure. Pt has been feeling very emotional the past couple of days.   Pt was tearful as she talked about feeling like a failure in many areas of her life.  Pt feels like she fail every day with her diet.   Pt is having trouble getting his son an appointment for his drivers license.   She does not want to disappoint her son. Pt's dog was sick which was worrisome but is ok now.  Pt's cat had a big absess.  Pt feels like she should have checked on her cat more.  Pt feels badly that she did not notice. Wilburn Mylar was the one year anniversary of her brother in law killing himself.     Pt is feeling emotionally overwhelmed.  When she feels overwhelmed she tends to shut down.   Pt talked about always feeling like a "misfit".    She worries that her kids  may feel like they don't belong either.   Addressed how pt is projecting her feelings onto worries about her kids.   Helped pt process her feelings.   Worked on thought reframing.   Worked on self care strategies.  Provided supportive therapy.    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  (Treatment Plan target date: 06/27/2022)  Pt is progressing toward treatment goals.   Pt wants a safe place to talk and to improve coping skills.  Pt would like to work on figuring out her own opinions and to communicate them.  Pt would like to increase confidence and self esteem.  Pt wants to learn to be assertive.    Plan to continue to meet every two weeks.    Jannette Cotham, LCSW

## 2022-01-31 ENCOUNTER — Encounter (INDEPENDENT_AMBULATORY_CARE_PROVIDER_SITE_OTHER): Payer: Self-pay | Admitting: Bariatrics

## 2022-01-31 ENCOUNTER — Ambulatory Visit (INDEPENDENT_AMBULATORY_CARE_PROVIDER_SITE_OTHER): Payer: 59 | Admitting: Bariatrics

## 2022-01-31 VITALS — BP 121/78 | HR 52 | Temp 97.6°F | Ht 66.0 in | Wt 207.0 lb

## 2022-01-31 DIAGNOSIS — Z6833 Body mass index (BMI) 33.0-33.9, adult: Secondary | ICD-10-CM

## 2022-01-31 DIAGNOSIS — F5089 Other specified eating disorder: Secondary | ICD-10-CM

## 2022-01-31 DIAGNOSIS — E559 Vitamin D deficiency, unspecified: Secondary | ICD-10-CM

## 2022-01-31 DIAGNOSIS — E669 Obesity, unspecified: Secondary | ICD-10-CM | POA: Diagnosis not present

## 2022-01-31 DIAGNOSIS — E7849 Other hyperlipidemia: Secondary | ICD-10-CM

## 2022-01-31 MED ORDER — VITAMIN D (ERGOCALCIFEROL) 1.25 MG (50000 UNIT) PO CAPS: 50000.0000 [IU] | ORAL_CAPSULE | ORAL | 0 refills | Status: DC

## 2022-01-31 NOTE — Progress Notes (Signed)
Chief Complaint:   OBESITY Sherry Marquez is here to discuss her progress with her obesity treatment plan along with follow-up of her obesity related diagnoses. Sherry Marquez is on the Category 3 Plan and keeping a food journal and adhering to recommended goals of 450-600 calories and 40 grams of protein at supper and states she is following her eating plan approximately 60% of the time. Sherry Marquez states she is walking for 40 minutes 4 times per week.  Today's visit was #: 15 Starting weight: 232 lbs Starting date: 04/28/2021 Today's weight: 207 lbs Today's date: 01/31/2022 Total lbs lost to date: 25 Total lbs lost since last in-office visit: 0  Interim History: Sherry Marquez is up 1 pound since her last visit.  She has had some emotional eating.  Her goal is to be below 200 pounds.  Subjective:   1. Vitamin D deficiency Sherry Marquez is taking vitamin D as directed.  2. Other hyperlipidemia Sherry Marquez is not on medications currently.  3. Other disorder of eating Sherry Marquez notes emotional eating.   Assessment/Plan:   1. Vitamin D deficiency We will refill prescription vitamin D 50,000 units every 14 days for 2 months.  - Vitamin D, Ergocalciferol, (DRISDOL) 1.25 MG (50000 UNIT) CAPS capsule; Take 1 capsule (50,000 Units total) by mouth every 14 (fourteen) days.  Dispense: 4 capsule; Refill: 0  2. Other hyperlipidemia Sherry Marquez is to eliminate trans fats, and minimize saturated fats except for dairy, dark chocolate, and unprocessed meat.  3. Other disorder of eating I discussed Wellbutrin side effects with the patient today.  4. Obesity with current BMI of 33.4 Sherry Marquez is currently in the action stage of change. As such, her goal is to continue with weight loss efforts. She has agreed to the Category 3 Plan and keeping a food journal and adhering to recommended goals of 450-600 calories and 40 grams of protein at supper.   Meal planning and intentional eating were discussed.  Exercise goals: As is.  Behavioral  modification strategies: increasing lean protein intake, decreasing simple carbohydrates, increasing vegetables, increasing water intake, decreasing eating out, no skipping meals, meal planning and cooking strategies, keeping healthy foods in the home, and planning for success.  Sherry Marquez has agreed to follow-up with our clinic in 2 to 3 weeks with myself, or with Everardo Pacific, FNP-C. She was informed of the importance of frequent follow-up visits to maximize her success with intensive lifestyle modifications for her multiple health conditions.   Objective:   Blood pressure 121/78, pulse (!) 52, temperature 97.6 F (36.4 C), height 5' 6"  (1.676 m), weight 207 lb (93.9 kg), SpO2 99 %. Body mass index is 33.41 kg/m.  General: Cooperative, alert, well developed, in no acute distress. HEENT: Conjunctivae and lids unremarkable. Cardiovascular: Regular rhythm.  Lungs: Normal work of breathing. Neurologic: No focal deficits.   Lab Results  Component Value Date   CREATININE 0.84 09/20/2021   BUN 21 09/20/2021   NA 140 09/20/2021   K 5.1 09/20/2021   CL 102 09/20/2021   CO2 23 09/20/2021   Lab Results  Component Value Date   ALT 32 09/20/2021   AST 28 09/20/2021   ALKPHOS 102 09/20/2021   BILITOT 0.4 09/20/2021   Lab Results  Component Value Date   HGBA1C 5.5 09/20/2021   HGBA1C 5.7 02/24/2021   HGBA1C 5.7 10/29/2013   Lab Results  Component Value Date   INSULIN 9.4 09/20/2021   INSULIN 12.2 04/28/2021   Lab Results  Component Value Date   TSH  2.340 04/28/2021   Lab Results  Component Value Date   CHOL 249 (H) 09/20/2021   HDL 49 09/20/2021   LDLCALC 184 (H) 09/20/2021   TRIG 90 09/20/2021   CHOLHDL 4 02/24/2021   Lab Results  Component Value Date   VD25OH 42.9 09/20/2021   VD25OH 22.1 (L) 04/28/2021   Lab Results  Component Value Date   WBC 4.6 02/24/2021   HGB 15.0 02/24/2021   HCT 44.0 02/24/2021   MCV 89.8 02/24/2021   PLT 241.0 02/24/2021   Lab  Results  Component Value Date   FERRITIN 58 09/05/2020   Attestation Statements:   Reviewed by clinician on day of visit: allergies, medications, problem list, medical history, surgical history, family history, social history, and previous encounter notes.   Wilhemena Durie, am acting as Location manager for [employer], DO.  I have reviewed the above documentation for accuracy and completeness, and I agree with the above. Jearld Lesch, DO

## 2022-02-03 ENCOUNTER — Encounter (INDEPENDENT_AMBULATORY_CARE_PROVIDER_SITE_OTHER): Payer: Self-pay | Admitting: Bariatrics

## 2022-02-04 ENCOUNTER — Ambulatory Visit: Payer: 59 | Admitting: Psychology

## 2022-02-04 DIAGNOSIS — F4321 Adjustment disorder with depressed mood: Secondary | ICD-10-CM | POA: Diagnosis not present

## 2022-02-04 NOTE — Progress Notes (Signed)
[facility] Counselor/Therapist Progress Note  Patient ID: Sherry Marquez, MRN: [id number],    Date: 02/04/2022  Time Spent: 10:00am - 10:50am    50 minutes   Treatment Type: Individual Therapy  Reported Symptoms: stress  Mental Status Exam: Appearance:  Neat     Behavior: Appropriate  Motor: Normal  Speech/Language:  Normal Rate  Affect: Appropriate  Mood: normal  Thought process: normal  Thought content:   WNL  Sensory/Perceptual disturbances:   WNL  Orientation: oriented to person, place, and time/date  Attention: Good  Concentration: Good  Memory: WNL  Fund of knowledge:  Good  Insight:   Good  Judgment:  Good  Impulse Control: Good   Risk Assessment: Danger to Self:  No Self-injurious Behavior: No Danger to Others: No Duty to Warn:no Physical Aggression / Violence:No  Access to Firearms a concern: No  Gang Involvement:No   Subjective:   Pt present for face-to-face individual therapy via video Webex.  Pt consents to telehealth video session due to COVID 19 pandemic. Location of pt: home  Location of therapist: home office.   Pt states she is feeling better than she was a couple of weeks ago.   Pt's mother will be having back surgery in a week and pt will go to help her parents out after the surgery.  Addressed pt's concerns about helping with caregiving for her mother.   Pt went to an appointment at [facility] and she had gained a lb.  It was recommended that she start taking Welbutrin to help decrease cravings.  Pt is going to think about it and may start the medication at her next appt the end of July. Pt has been exercising and going to the [facility]. Pt talked about having a lot of paperwork to do that she has been procrastinating and not doing.   Worked on procrastination issues.  Worked on self care strategies.  Provided supportive therapy.    Interventions: Cognitive Behavioral Therapy and Grief Therapy  Diagnosis:  F43.21  Plan: See pt's Treatment plan in Therapy Charts.  (Treatment Plan target date: 06/27/2022)  Pt is progressing toward treatment goals.   Pt wants a safe place to talk and to improve coping skills.  Pt would like to work on figuring out her own opinions and to communicate them.  Pt would like to increase confidence and self esteem.  Pt wants to learn to be assertive.    Plan to continue to meet every two weeks.    Jeanette Rauth, LCSW

## 2022-02-28 ENCOUNTER — Ambulatory Visit (INDEPENDENT_AMBULATORY_CARE_PROVIDER_SITE_OTHER): Payer: 59 | Admitting: Nurse Practitioner

## 2022-02-28 ENCOUNTER — Ambulatory Visit: Payer: 59 | Admitting: Psychology

## 2022-02-28 ENCOUNTER — Encounter (INDEPENDENT_AMBULATORY_CARE_PROVIDER_SITE_OTHER): Payer: Self-pay | Admitting: Nurse Practitioner

## 2022-02-28 VITALS — BP 122/73 | HR 60 | Temp 97.7°F | Ht 66.0 in | Wt 211.0 lb

## 2022-02-28 DIAGNOSIS — E559 Vitamin D deficiency, unspecified: Secondary | ICD-10-CM | POA: Diagnosis not present

## 2022-02-28 DIAGNOSIS — E7849 Other hyperlipidemia: Secondary | ICD-10-CM | POA: Diagnosis not present

## 2022-02-28 DIAGNOSIS — F4321 Adjustment disorder with depressed mood: Secondary | ICD-10-CM | POA: Diagnosis not present

## 2022-02-28 DIAGNOSIS — Z6834 Body mass index (BMI) 34.0-34.9, adult: Secondary | ICD-10-CM

## 2022-02-28 DIAGNOSIS — E669 Obesity, unspecified: Secondary | ICD-10-CM

## 2022-02-28 DIAGNOSIS — R638 Other symptoms and signs concerning food and fluid intake: Secondary | ICD-10-CM | POA: Diagnosis not present

## 2022-02-28 DIAGNOSIS — E66812 Obesity, class 2: Secondary | ICD-10-CM

## 2022-02-28 MED ORDER — BUPROPION HCL ER (SR) 150 MG PO TB12: 150.0000 mg | ORAL_TABLET | Freq: Every day | ORAL | 0 refills | Status: DC

## 2022-02-28 NOTE — Progress Notes (Signed)
[facility] Counselor/Therapist Progress Note  Patient ID: HAIVEN NARDONE, MRN: [id number],    Date: 02/28/2022  Time Spent: 10:00am - 10:55am    55 minutes   Treatment Type: Individual Therapy  Reported Symptoms: stress  Mental Status Exam: Appearance:  Neat     Behavior: Appropriate  Motor: Normal  Speech/Language:  Normal Rate  Affect: Appropriate  Mood: normal  Thought process: normal  Thought content:   WNL  Sensory/Perceptual disturbances:   WNL  Orientation: oriented to person, place, and time/date  Attention: Good  Concentration: Good  Memory: WNL  Fund of knowledge:  Good  Insight:   Good  Judgment:  Good  Impulse Control: Good   Risk Assessment: Danger to Self:  No Self-injurious Behavior: No Danger to Others: No Duty to Warn:no Physical Aggression / Violence:No  Access to Firearms a concern: No  Gang Involvement:No   Subjective:   Pt present for face-to-face individual therapy via video Webex.  Pt consents to telehealth video session due to COVID 19 pandemic. Location of pt: home  Location of therapist: home office.   Pt talked about visiting her family.   Her mother has been recuperating from back surgery.  Pt states it was a good visit.   Pt states she has felt like she was in a cycle of overwhelm at home.  Being away at her parents' house helped her to decompress.  She really needed a break from being at her home.   Pt saw her weight and wellness doctor.  She has gained some weight so she was prescribed Welbutrin to help reduce cravings.  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   Worked on self care strategies.  Provided supportive therapy.    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  (Treatment Plan target date: 06/27/2022)  Pt is progressing toward treatment goals.   Pt wants a safe place to  talk and to improve coping skills.  Pt would like to work on figuring out her own opinions and to communicate them.  Pt would like to increase confidence and self esteem.  Pt wants to learn to be assertive.    Plan to continue to meet every two weeks.    Alencia Gordon, LCSW

## 2022-02-28 NOTE — Patient Instructions (Signed)
The 10-year ASCVD risk score (Arnett DK, et al., 2019) is: 1.6%   Values used to calculate the score:     Age: 51 years     Sex: Female     Is Non-Hispanic African American: No     Diabetic: No     Tobacco smoker: No     Systolic Blood Pressure: 122 mmHg     Is BP treated: No     HDL Cholesterol: 49 mg/dL     Total Cholesterol: 249 mg/dL

## 2022-03-01 LAB — COMPREHENSIVE METABOLIC PANEL
ALT: 18 IU/L (ref 0–32)
AST: 18 IU/L (ref 0–40)
Albumin/Globulin Ratio: 1.8 (ref 1.2–2.2)
Albumin: 4.3 g/dL (ref 3.9–4.9)
Alkaline Phosphatase: 93 IU/L (ref 44–121)
BUN/Creatinine Ratio: 27 — ABNORMAL HIGH (ref 9–23)
BUN: 23 mg/dL (ref 6–24)
Bilirubin Total: 0.3 mg/dL (ref 0.0–1.2)
CO2: 22 mmol/L (ref 20–29)
Calcium: 9.6 mg/dL (ref 8.7–10.2)
Chloride: 102 mmol/L (ref 96–106)
Creatinine, Ser: 0.86 mg/dL (ref 0.57–1.00)
Globulin, Total: 2.4 g/dL (ref 1.5–4.5)
Glucose: 99 mg/dL (ref 70–99)
Potassium: 4.7 mmol/L (ref 3.5–5.2)
Sodium: 141 mmol/L (ref 134–144)
Total Protein: 6.7 g/dL (ref 6.0–8.5)
eGFR: 83 mL/min/{1.73_m2} (ref >59)

## 2022-03-01 LAB — LIPID PANEL WITH LDL/HDL RATIO
Cholesterol, Total: 233 mg/dL — ABNORMAL HIGH (ref 100–199)
HDL: 56 mg/dL (ref >39)
LDL Chol Calc (NIH): 160 mg/dL — ABNORMAL HIGH (ref 0–99)
LDL/HDL Ratio: 2.9 ratio (ref 0.0–3.2)
Triglycerides: 94 mg/dL (ref 0–149)
VLDL Cholesterol Cal: 17 mg/dL (ref 5–40)

## 2022-03-01 LAB — VITAMIN D 25 HYDROXY (VIT D DEFICIENCY, FRACTURES): Vit D, 25-Hydroxy: 30.6 ng/mL (ref 30.0–100.0)

## 2022-03-01 NOTE — Progress Notes (Signed)
Chief Complaint:   OBESITY Sherry Marquez is here to discuss her progress with her obesity treatment plan along with follow-up of her obesity related diagnoses. Sherry Marquez is on the Category 3 Plan and keeping a food journal and adhering to recommended goals of 450-600 calories and 40 grams of protein and states she is following her eating plan approximately 70% of the time. Sherry Marquez states she is exercising 0 minutes 0 times per week.  Today's visit was #: 16 Starting weight: 232 lbs Starting date: 04/28/2021 Today's weight: 211 lbs Today's date: 02/28/2022 Total lbs lost to date: 21 lbs Total lbs lost since last in-office visit: 0  Interim History: Sherry Marquez has been struggling with weight loss since 2023-09-06. Struggling with hunger and cravings. Has been a lifetime of habits. She has had a lot of stress over the last past couple of years. Both of her in-laws died on the same day in 2019-09-06 and her brother in-law died 9 months later. Notes, she is a stress and emotional eater. She got a new dog in Feb.  Subjective:   1. Vitamin D deficiency Sherry Marquez is currently taking prescription Vit D 50,000 IU every 14 days. Notes some joint and muscle pain in the hips. Started a couple of months ago. Does not seem to be connected since starting Vit D but she is not sure.  2. Other hyperlipidemia Sherry Marquez has never been on medications. Family history: father, HDL, HTN and MI. ASCVD 1.6%--reviewed with patient today.  3. Abnormal craving Sherry Marquez is struggling with cravings.  Assessment/Plan:   1. Vitamin D deficiency We will obtain labs today.  To stop Vit D until next visit.    - Comprehensive metabolic panel - VITAMIN D 25 Hydroxy (Vit-D Deficiency, Fractures)  2. Other hyperlipidemia We will obtain labs today.  Cardiovascular risk and specific lipid/LDL goals reviewed.  We discussed several lifestyle modifications today and Sherry Marquez will continue to work on diet, exercise and weight loss efforts. Orders and follow up  as documented in patient record.   Counseling Intensive lifestyle modifications are the first line treatment for this issue. Dietary changes: Increase soluble fiber. Decrease simple carbohydrates. Exercise changes: Moderate to vigorous-intensity aerobic activity 150 minutes per week if tolerated. Lipid-lowering medications: see documented in medical record.   The 10-year ASCVD risk score (Arnett DK, et al., 09/05/17) is: 1.6%   Values used to calculate the score:     Age: 51 years     Sex: Female     Is Non-Hispanic African American: No     Diabetic: No     Tobacco smoker: No     Systolic Blood Pressure: 122 mmHg     Is BP treated: No     HDL Cholesterol: 49 mg/dL     Total Cholesterol: 249 mg/dL  - Comprehensive metabolic panel - Lipid Panel With LDL/HDL Ratio  3. Abnormal craving Start Wellbutrin SR 150 mg. Side effects discussed.   -Start buPROPion (WELLBUTRIN SR) 150 MG 12 hr tablet; Take 1 tablet (150 mg total) by mouth daily.  Dispense: 30 tablet; Refill: 0  4. Obesity with current BMI of 34.1 Sherry Marquez is currently in the action stage of change. As such, her goal is to continue with weight loss efforts. She has agreed to the Category 3 Plan. We will obtain labs today.  Exercise goals: All adults should avoid inactivity. Some physical activity is better than none, and adults who participate in any amount of physical activity gain some health benefits.  Behavioral  modification strategies: increasing lean protein intake, increasing water intake, and planning for success.  Sherry Marquez has agreed to follow-up with our clinic in 3 weeks. She was informed of the importance of frequent follow-up visits to maximize her success with intensive lifestyle modifications for her multiple health conditions.   Sherry Marquez was informed we would discuss her lab results at her next visit unless there is a critical issue that needs to be addressed sooner. Sherry Marquez agreed to keep her next visit at the agreed upon  time to discuss these results.  Objective:   Blood pressure 122/73, pulse 60, temperature 97.7 F (36.5 C), height 5\' 6"  (1.676 m), weight 211 lb (95.7 kg), SpO2 99 %. Body mass index is 34.06 kg/m.  General: Cooperative, alert, well developed, in no acute distress. HEENT: Conjunctivae and lids unremarkable. Cardiovascular: Regular rhythm.  Lungs: Normal work of breathing. Neurologic: No focal deficits.   Lab Results  Component Value Date   CREATININE 0.86 02/28/2022   BUN 23 02/28/2022   NA 141 02/28/2022   K 4.7 02/28/2022   CL 102 02/28/2022   CO2 22 02/28/2022   Lab Results  Component Value Date   ALT 18 02/28/2022   AST 18 02/28/2022   ALKPHOS 93 02/28/2022   BILITOT 0.3 02/28/2022   Lab Results  Component Value Date   HGBA1C 5.5 09/20/2021   HGBA1C 5.7 02/24/2021   HGBA1C 5.7 10/29/2013   Lab Results  Component Value Date   INSULIN 9.4 09/20/2021   INSULIN 12.2 04/28/2021   Lab Results  Component Value Date   TSH 2.340 04/28/2021   Lab Results  Component Value Date   CHOL 233 (H) 02/28/2022   HDL 56 02/28/2022   LDLCALC 160 (H) 02/28/2022   TRIG 94 02/28/2022   CHOLHDL 4 02/24/2021   Lab Results  Component Value Date   VD25OH 30.6 02/28/2022   VD25OH 42.9 09/20/2021   VD25OH 22.1 (L) 04/28/2021   Lab Results  Component Value Date   WBC 4.6 02/24/2021   HGB 15.0 02/24/2021   HCT 44.0 02/24/2021   MCV 89.8 02/24/2021   PLT 241.0 02/24/2021   Lab Results  Component Value Date   FERRITIN 58 09/05/2020   Attestation Statements:   Reviewed by clinician on day of visit: allergies, medications, problem list, medical history, surgical history, family history, social history, and previous encounter notes.  I, Brendell Tyus, RMA, am acting as transcriptionist for Irene Limbo, FNP.  I have reviewed the above documentation for accuracy and completeness, and I agree with the above. Irene Limbo, FNP

## 2022-03-14 ENCOUNTER — Ambulatory Visit: Payer: 59 | Admitting: Psychology

## 2022-03-14 DIAGNOSIS — F4321 Adjustment disorder with depressed mood: Secondary | ICD-10-CM

## 2022-03-14 NOTE — Progress Notes (Signed)
[facility] Counselor/Therapist Progress Note  Patient ID: Sherry Marquez, MRN: [id number],    Date: 03/14/2022  Time Spent: 9:00am - 9:55am    55 minutes   Treatment Type: Individual Therapy  Reported Symptoms: stress  Mental Status Exam: Appearance:  Neat     Behavior: Appropriate  Motor: Normal  Speech/Language:  Normal Rate  Affect: Appropriate  Mood: normal  Thought process: normal  Thought content:   WNL  Sensory/Perceptual disturbances:   WNL  Orientation: oriented to person, place, and time/date  Attention: Good  Concentration: Good  Memory: WNL  Fund of knowledge:  Good  Insight:   Good  Judgment:  Good  Impulse Control: Good   Risk Assessment: Danger to Self:  No Self-injurious Behavior: No Danger to Others: No Duty to Warn:no Physical Aggression / Violence:No  Access to Firearms a concern: No  Gang Involvement:No   Subjective:   Pt present for face-to-face individual therapy via video Webex.  Pt consents to telehealth video session due to COVID 19 pandemic. Location of pt: home  Location of therapist: home office.   Pt talked about being on Welbutrin for 3 weeks.   She is experiencing increased energy and better focus.   Pt talked about gearing up for the fall and her kids starting back to school and sports.   She is concerned it will be very hectic and is worried she will be overwhelmed.   Worked with pt on how she can plan ahead to decrease stress.  Pt talked about concerns about her husband who has been having a hard time lately.  Pt feels frustrated and resentful bc her husband has not been engaging in things on the weekend.  He takes about 3 naps a day on the weekends.  Worked on how pt can communicate her concerns to her husband and help him engage in behavioral activation.   Helped pt process her feelings and relationship dynamics.   Worked on self care strategies.  Provided supportive therapy.    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  (Treatment Plan target date: 06/27/2022)  Pt is progressing toward treatment goals.   Pt wants a safe place to talk and to improve coping skills.  Pt would like to work on figuring out her own opinions and to communicate them.  Pt would like to increase confidence and self esteem.  Pt wants to learn to be assertive.    Plan to continue to meet every two weeks.    Sheran Newstrom, LCSW

## 2022-03-16 ENCOUNTER — Encounter (INDEPENDENT_AMBULATORY_CARE_PROVIDER_SITE_OTHER): Payer: Self-pay

## 2022-03-23 ENCOUNTER — Ambulatory Visit (INDEPENDENT_AMBULATORY_CARE_PROVIDER_SITE_OTHER): Payer: 59 | Admitting: Nurse Practitioner

## 2022-03-23 VITALS — BP 134/76 | HR 68 | Temp 98.2°F | Ht 66.0 in | Wt 212.0 lb

## 2022-03-23 DIAGNOSIS — E669 Obesity, unspecified: Secondary | ICD-10-CM | POA: Diagnosis not present

## 2022-03-23 DIAGNOSIS — Z6834 Body mass index (BMI) 34.0-34.9, adult: Secondary | ICD-10-CM

## 2022-03-23 DIAGNOSIS — E7849 Other hyperlipidemia: Secondary | ICD-10-CM | POA: Diagnosis not present

## 2022-03-23 DIAGNOSIS — E559 Vitamin D deficiency, unspecified: Secondary | ICD-10-CM | POA: Diagnosis not present

## 2022-03-23 DIAGNOSIS — R638 Other symptoms and signs concerning food and fluid intake: Secondary | ICD-10-CM

## 2022-03-23 MED ORDER — BUPROPION HCL ER (SR) 150 MG PO TB12: 150.0000 mg | ORAL_TABLET | Freq: Every day | ORAL | 0 refills | Status: DC

## 2022-03-23 MED ORDER — VITAMIN D (ERGOCALCIFEROL) 1.25 MG (50000 UNIT) PO CAPS: 50000.0000 [IU] | ORAL_CAPSULE | ORAL | 0 refills | Status: DC

## 2022-03-23 NOTE — Patient Instructions (Signed)
The 10-year ASCVD risk score (Arnett DK, et al., 2019) is: 1.5%   Values used to calculate the score:     Age: 51 years     Sex: Female     Is Non-Hispanic African American: No     Diabetic: No     Tobacco smoker: No     Systolic Blood Pressure: 134 mmHg     Is BP treated: No     HDL Cholesterol: 56 mg/dL     Total Cholesterol: 233 mg/dL

## 2022-03-31 ENCOUNTER — Ambulatory Visit: Payer: 59 | Admitting: Psychology

## 2022-03-31 DIAGNOSIS — F4321 Adjustment disorder with depressed mood: Secondary | ICD-10-CM

## 2022-03-31 NOTE — Progress Notes (Signed)
Chief Complaint:   OBESITY Sherry Marquez is here to discuss her progress with her obesity treatment plan along with follow-up of her obesity related diagnoses. Sherry Marquez is on the Category 3 Plan and states she is following her eating plan approximately 80% of the time. Sherry Marquez states she is walking 20 minutes 4 times per week.  Today's visit was #: 17 Starting weight: 232 lbs Starting date: 04/28/2021 Today's weight: 212 lbs Today's date: 03/23/2022 Total lbs lost to date: 20 lbs Total lbs lost since last in-office visit: 0  Interim History: Sherry Marquez overall has done well with weight loss. She is concerned that she has regained some of the weight she lost. Feels due to the eating the things not on her plan. She not measuring or weighing her food. Drinking water daily.  Subjective:   1. Vitamin D deficiency Sherry Marquez is taking Vit D 50,000 IU every 2 weeks. She did not stop taking after we discussed at last visit. She would like to start Vit D 50,000 IU weekly.  2. Abnormal craving Sherry Marquez started Wellbutrin SR 150 mg after last visit. Denies any side effects. Notes more awake and more clarity in the AM. Taking Wellbutrin in the am, helps with cravings but notes more cravings in the evenings.  3. Other hyperlipidemia Sherry Marquez has never been on medication. Family history: father, mother and brother.  Assessment/Plan:   1. Vitamin D deficiency We will refill Vit D 50,000 IU once a week for 1 month with 0 refills.  Low Vitamin D level contributes to fatigue and are associated with obesity, breast, and colon cancer. She agrees to continue to take prescription Vitamin D @50 ,000 IU every week and will follow-up for routine testing of Vitamin D, at least 2-3 times per year to avoid over-replacement.   -Refill Vitamin D, Ergocalciferol, (DRISDOL) 1.25 MG (50000 UNIT) CAPS capsule; Take 1 capsule (50,000 Units total) by mouth every 7 (seven) days.  Dispense: 4 capsule; Refill: 0  2. Abnormal craving We will  refill Wellbutrin SR 150 mg daily for 1 month with 0 refills. Take at lunch time. Side effects discussed.   -Refill buPROPion (WELLBUTRIN SR) 150 MG 12 hr tablet; Take 1 tablet (150 mg total) by mouth daily.  Dispense: 30 tablet; Refill: 0  3. Other hyperlipidemia 10 year ASCVD reviewed today 1.5%. Cardiovascular risk and specific lipid/LDL goals reviewed.  We discussed several lifestyle modifications today and Sherry Marquez will continue to work on diet, exercise and weight loss efforts. Orders and follow up as documented in patient record.   The 10-year ASCVD risk score (Arnett DK, et al., 2019) is: 1.5%   Values used to calculate the score:     Age: 51 years     Sex: Female     Is Non-Hispanic African American: No     Diabetic: No     Tobacco smoker: No     Systolic Blood Pressure: 134 mmHg     Is BP treated: No     HDL Cholesterol: 56 mg/dL     Total Cholesterol: 233 mg/dL  Counseling Intensive lifestyle modifications are the first line treatment for this issue. Dietary changes: Increase soluble fiber. Decrease simple carbohydrates. Exercise changes: Moderate to vigorous-intensity aerobic activity 150 minutes per week if tolerated. Lipid-lowering medications: see documented in medical record.  4. Obesity with current BMI of 34.3 Sherry Marquez is currently in the action stage of change. As such, her goal is to continue with weight loss efforts. She has agreed to the  Category 3 Plan.   Will obtain IC at next visit.  Exercise goals: As is.  Behavioral modification strategies: increasing lean protein intake, increasing water intake, and planning for success.  Sherry Marquez has agreed to follow-up with our clinic in 3 weeks. She was informed of the importance of frequent follow-up visits to maximize her success with intensive lifestyle modifications for her multiple health conditions.   Objective:   Blood pressure 134/76, pulse 68, temperature 98.2 F (36.8 C), height 5\' 6"  (1.676 m), weight 212 lb  (96.2 kg), SpO2 99 %. Body mass index is 34.22 kg/m.  General: Cooperative, alert, well developed, in no acute distress. HEENT: Conjunctivae and lids unremarkable. Cardiovascular: Regular rhythm.  Lungs: Normal work of breathing. Neurologic: No focal deficits.   Lab Results  Component Value Date   CREATININE 0.86 02/28/2022   BUN 23 02/28/2022   NA 141 02/28/2022   K 4.7 02/28/2022   CL 102 02/28/2022   CO2 22 02/28/2022   Lab Results  Component Value Date   ALT 18 02/28/2022   AST 18 02/28/2022   ALKPHOS 93 02/28/2022   BILITOT 0.3 02/28/2022   Lab Results  Component Value Date   HGBA1C 5.5 09/20/2021   HGBA1C 5.7 02/24/2021   HGBA1C 5.7 10/29/2013   Lab Results  Component Value Date   INSULIN 9.4 09/20/2021   INSULIN 12.2 04/28/2021   Lab Results  Component Value Date   TSH 2.340 04/28/2021   Lab Results  Component Value Date   CHOL 233 (H) 02/28/2022   HDL 56 02/28/2022   LDLCALC 160 (H) 02/28/2022   TRIG 94 02/28/2022   CHOLHDL 4 02/24/2021   Lab Results  Component Value Date   VD25OH 30.6 02/28/2022   VD25OH 42.9 09/20/2021   VD25OH 22.1 (L) 04/28/2021   Lab Results  Component Value Date   WBC 4.6 02/24/2021   HGB 15.0 02/24/2021   HCT 44.0 02/24/2021   MCV 89.8 02/24/2021   PLT 241.0 02/24/2021   Lab Results  Component Value Date   FERRITIN 58 09/05/2020   Attestation Statements:   Reviewed by clinician on day of visit: allergies, medications, problem list, medical history, surgical history, family history, social history, and previous encounter notes.  I, Brendell Tyus, RMA, am acting as transcriptionist for 09/07/2020, FNP.  I have reviewed the above documentation for accuracy and completeness, and I agree with the above. Irene Limbo, FNP

## 2022-03-31 NOTE — Progress Notes (Signed)
[facility] Counselor/Therapist Progress Note  Patient ID: Sherry Marquez, MRN: [id number],    Date: 03/31/2022  Time Spent: 10:00am - 10:55am    55 minutes   Treatment Type: Individual Therapy  Reported Symptoms: stress  Mental Status Exam: Appearance:  Neat     Behavior: Appropriate  Motor: Normal  Speech/Language:  Normal Rate  Affect: Appropriate  Mood: normal  Thought process: normal  Thought content:   WNL  Sensory/Perceptual disturbances:   WNL  Orientation: oriented to person, place, and time/date  Attention: Good  Concentration: Good  Memory: WNL  Fund of knowledge:  Good  Insight:   Good  Judgment:  Good  Impulse Control: Good   Risk Assessment: Danger to Self:  No Self-injurious Behavior: No Danger to Others: No Duty to Warn:no Physical Aggression / Violence:No  Access to Firearms a concern: No  Gang Involvement:No   Subjective:   Pt present for face-to-face individual therapy via video Webex.  Pt consents to telehealth video session due to COVID 19 pandemic. Location of pt: home  Location of therapist: home office.   Pt talked about the school year starting for her kids.  It is a busy time of year but pt is glad for the increased structure.  Pt is feeling stress about her oldest son driving himself to school.   Addressed pt's concerns and worked on Optician, dispensing and coping skills.   Pt's oldest son is a senior this year and is looking into going to [employer] next year. Pt's son has high functioning autism.   Pt tends to worry about her kids.  Worked on worry management and thought reframing.    Worked on self care strategies.  Provided supportive therapy.    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  (Treatment Plan target date: 06/27/2022)  Pt is progressing toward treatment goals.   Pt wants a safe place to talk and to improve coping skills.  Pt  would like to work on figuring out her own opinions and to communicate them.  Pt would like to increase confidence and self esteem.  Pt wants to learn to be assertive.    Plan to continue to meet every two weeks.    Gianmarco Roye, LCSW

## 2022-04-14 ENCOUNTER — Encounter (INDEPENDENT_AMBULATORY_CARE_PROVIDER_SITE_OTHER): Payer: Self-pay | Admitting: Nurse Practitioner

## 2022-04-14 ENCOUNTER — Ambulatory Visit (INDEPENDENT_AMBULATORY_CARE_PROVIDER_SITE_OTHER): Payer: 59 | Admitting: Nurse Practitioner

## 2022-04-14 VITALS — BP 134/84 | HR 56 | Temp 97.8°F | Ht 66.0 in | Wt 210.0 lb

## 2022-04-14 DIAGNOSIS — E559 Vitamin D deficiency, unspecified: Secondary | ICD-10-CM | POA: Diagnosis not present

## 2022-04-14 DIAGNOSIS — Z6834 Body mass index (BMI) 34.0-34.9, adult: Secondary | ICD-10-CM

## 2022-04-14 DIAGNOSIS — R638 Other symptoms and signs concerning food and fluid intake: Secondary | ICD-10-CM | POA: Diagnosis not present

## 2022-04-14 DIAGNOSIS — R0602 Shortness of breath: Secondary | ICD-10-CM | POA: Diagnosis not present

## 2022-04-14 DIAGNOSIS — E669 Obesity, unspecified: Secondary | ICD-10-CM | POA: Diagnosis not present

## 2022-04-14 MED ORDER — BUPROPION HCL ER (SR) 150 MG PO TB12: 150.0000 mg | ORAL_TABLET | Freq: Two times a day (BID) | ORAL | 0 refills | Status: DC

## 2022-04-14 MED ORDER — VITAMIN D (ERGOCALCIFEROL) 1.25 MG (50000 UNIT) PO CAPS: 50000.0000 [IU] | ORAL_CAPSULE | ORAL | 0 refills | Status: DC

## 2022-04-18 NOTE — Progress Notes (Unsigned)
Chief Complaint:   OBESITY Sherry Marquez is here to discuss her progress with her obesity treatment plan along with follow-up of her obesity related diagnoses. Sherry Marquez is on the Category 3 Plan and states she is following her eating plan approximately 80% of the time. Sherry Marquez states she is exercising 0 minutes 0 times per week.  Today's visit was #: 18 Starting weight: 232 lbs Starting date: 04/28/2021 Today's weight: 210 lbs Today's date: 04/14/2022 Total lbs lost to date: 22 lbs Total lbs lost since last in-office visit: 2  Interim History: Sherry Marquez has done well with weight loss since her last visit. Has been on vacation since her last visit. She has been under a lot of stress and notes some stress eating. Notes some polyphagia and cravings. Drinking water and [employer] milk daily.  Subjective:   1. Abnormal craving Sherry Marquez is taking Wellbutrin SR 150 mg once to twice a day. Has helped with cravings and would like to take twice per day.  2. Vitamin D deficiency Sherry Marquez is currently taking prescription Vit D 50,000 IU once a week.   Assessment/Plan:   1. Abnormal craving We will increase/refill Wellbutrin SR 150 mg twice a day for 1 month with 0 refills.  -Increase/refill buPROPion (WELLBUTRIN SR) 150 MG 12 hr tablet; Take 1 tablet (150 mg total) by mouth 2 (two) times daily.  Dispense: 60 tablet; Refill: 0  2. Vitamin D deficiency We will refill Vit D 50,000 IU once a week for 1 month with 0 refills. Low Vitamin D level contributes to fatigue and are associated with obesity, breast, and colon cancer. She agrees to continue to take prescription Vitamin D @50 ,000 IU every week and will follow-up for routine testing of Vitamin D, at least 2-3 times per year to avoid over-replacement.   -Refill Vitamin D, Ergocalciferol, (DRISDOL) 1.25 MG (50000 UNIT) CAPS capsule; Take 1 capsule (50,000 Units total) by mouth every 7 (seven) days.  Dispense: 4 capsule; Refill: 0  3. SOB (shortness of breath) on  exertion ***  4. Obesity with current BMI of 34.0 Sherry Marquez is currently in the action stage of change. As such, her goal is to continue with weight loss efforts. She has agreed to the Category 3 Plan.   We discussed starting Cat 2. Sherry Marquez would like to continue Cat 3 at this time. Will rediscuss at next visit.  Exercise goals: All adults should avoid inactivity. Some physical activity is better than none, and adults who participate in any amount of physical activity gain some health benefits.  Behavioral modification strategies: increasing lean protein intake, increasing vegetables, and planning for success.  Sherry Marquez has agreed to follow-up with our clinic in 4 weeks. She was informed of the importance of frequent follow-up visits to maximize her success with intensive lifestyle modifications for her multiple health conditions.   Objective:   Blood pressure 134/84, pulse (!) 56, temperature 97.8 F (36.6 C), height 5\' 6"  (1.676 m), weight 210 lb (95.3 kg), SpO2 98 %. Body mass index is 33.89 kg/m.  General: Cooperative, alert, well developed, in no acute distress. HEENT: Conjunctivae and lids unremarkable. Cardiovascular: Regular rhythm.  Lungs: Normal work of breathing. Neurologic: No focal deficits.   Lab Results  Component Value Date   CREATININE 0.86 02/28/2022   BUN 23 02/28/2022   NA 141 02/28/2022   K 4.7 02/28/2022   CL 102 02/28/2022   CO2 22 02/28/2022   Lab Results  Component Value Date   ALT 18 02/28/2022  AST 18 02/28/2022   ALKPHOS 93 02/28/2022   BILITOT 0.3 02/28/2022   Lab Results  Component Value Date   HGBA1C 5.5 09/20/2021   HGBA1C 5.7 02/24/2021   HGBA1C 5.7 10/29/2013   Lab Results  Component Value Date   INSULIN 9.4 09/20/2021   INSULIN 12.2 04/28/2021   Lab Results  Component Value Date   TSH 2.340 04/28/2021   Lab Results  Component Value Date   CHOL 233 (H) 02/28/2022   HDL 56 02/28/2022   LDLCALC 160 (H) 02/28/2022   TRIG 94  02/28/2022   CHOLHDL 4 02/24/2021   Lab Results  Component Value Date   VD25OH 30.6 02/28/2022   VD25OH 42.9 09/20/2021   VD25OH 22.1 (L) 04/28/2021   Lab Results  Component Value Date   WBC 4.6 02/24/2021   HGB 15.0 02/24/2021   HCT 44.0 02/24/2021   MCV 89.8 02/24/2021   PLT 241.0 02/24/2021   Lab Results  Component Value Date   FERRITIN 58 09/05/2020   Attestation Statements:   Reviewed by clinician on day of visit: allergies, medications, problem list, medical history, surgical history, family history, social history, and previous encounter notes.  I, Brendell Tyus, RMA, am acting as transcriptionist for Irene Limbo, FNP.  I have reviewed the above documentation for accuracy and completeness, and I agree with the above. -  ***

## 2022-04-19 ENCOUNTER — Ambulatory Visit (INDEPENDENT_AMBULATORY_CARE_PROVIDER_SITE_OTHER): Payer: 59 | Admitting: Psychology

## 2022-04-19 DIAGNOSIS — F4321 Adjustment disorder with depressed mood: Secondary | ICD-10-CM

## 2022-04-19 NOTE — Progress Notes (Signed)
[facility] Counselor/Therapist Progress Note  Patient ID: Sherry CREMEANS, MRN: [id number],    Date: 04/19/2022  Time Spent: 11:00am - 11:50am    50 minutes   Treatment Type: Individual Therapy  Reported Symptoms: stress  Mental Status Exam: Appearance:  Neat     Behavior: Appropriate  Motor: Normal  Speech/Language:  Normal Rate  Affect: Appropriate  Mood: normal  Thought process: normal  Thought content:   WNL  Sensory/Perceptual disturbances:   WNL  Orientation: oriented to person, place, and time/date  Attention: Good  Concentration: Good  Memory: WNL  Fund of knowledge:  Good  Insight:   Good  Judgment:  Good  Impulse Control: Good   Risk Assessment: Danger to Self:  No Self-injurious Behavior: No Danger to Others: No Duty to Warn:no Physical Aggression / Violence:No  Access to Firearms a concern: No  Gang Involvement:No   Subjective:   Pt present for face-to-face individual therapy via video Webex.  Pt consents to telehealth video session due to COVID 19 pandemic. Location of pt: home  Location of therapist: home office.   Pt talked about spending time with her family and her brother's family that she was anxious about ahead of time.   The visit went better than she expected. Pt was stress eating to cope and was feeling badly about herself.  Since the stress of the beginning of school and family issues has decreased pt has not been stress eating as much recently.  She is adhering to her diet more successfully.   Pt states she often does not realize how stressed she is in the moment.  Worked on identifying cues that pt's stress level is increased and addressed how to problem solve stress and increase self care.   Provided supportive therapy.    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  (Treatment Plan target date: 06/27/2022)  Pt is progressing toward  treatment goals.   Pt wants a safe place to talk and to improve coping skills.  Pt would like to work on figuring out her own opinions and to communicate them.  Pt would like to increase confidence and self esteem.  Pt wants to learn to be assertive.    Plan to continue to meet every two weeks.    Meliza Kage, LCSW

## 2022-05-03 ENCOUNTER — Ambulatory Visit: Payer: 59 | Admitting: Psychology

## 2022-05-03 DIAGNOSIS — F4321 Adjustment disorder with depressed mood: Secondary | ICD-10-CM | POA: Diagnosis not present

## 2022-05-03 NOTE — Progress Notes (Signed)
[facility] Counselor/Therapist Progress Note  Patient ID: Sherry Marquez, MRN: [id number],    Date: 05/03/2022  Time Spent: 10:00am - 10:50am    50 minutes   Treatment Type: Individual Therapy  Reported Symptoms: stress  Mental Status Exam: Appearance:  Neat     Behavior: Appropriate  Motor: Normal  Speech/Language:  Normal Rate  Affect: Appropriate  Mood: normal  Thought process: normal  Thought content:   WNL  Sensory/Perceptual disturbances:   WNL  Orientation: oriented to person, place, and time/date  Attention: Good  Concentration: Good  Memory: WNL  Fund of knowledge:  Good  Insight:   Good  Judgment:  Good  Impulse Control: Good   Risk Assessment: Danger to Self:  No Self-injurious Behavior: No Danger to Others: No Duty to Warn:no Physical Aggression / Violence:No  Access to Firearms a concern: No  Gang Involvement:No   Subjective:   Pt present for face-to-face individual therapy via video Webex.  Pt consents to telehealth video session due to COVID 19 pandemic. Location of pt: home  Location of therapist: home office.   Pt talked about feeling disorganized in her house.  She is having trouble getting motivated. Pt states she is not doing as well on her diet as she would like to.  She has had some binges.  She has been eating impulsively and feels badly about herself.  Addressed the issues that impact pt's eating.  Pt tends to eat when she is stressed.  Addressd how pt can eat mindfully.   Worked on coping strategies.   Pt does not feel very productive in her days.   She feels badly about herself and this tends to lead to more overeating.   Provided supportive therapy.    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  (Treatment Plan target date: 06/27/2022)  Pt is progressing toward treatment goals.   Pt wants a safe place to talk and to improve coping  skills.  Pt would like to work on figuring out her own opinions and to communicate them.  Pt would like to increase confidence and self esteem.  Pt wants to learn to be assertive.    Plan to continue to meet every two weeks.    Kuuipo Anzaldo, LCSW

## 2022-05-12 ENCOUNTER — Encounter (INDEPENDENT_AMBULATORY_CARE_PROVIDER_SITE_OTHER): Payer: Self-pay | Admitting: Adult Health

## 2022-05-12 ENCOUNTER — Ambulatory Visit (INDEPENDENT_AMBULATORY_CARE_PROVIDER_SITE_OTHER): Payer: 59 | Admitting: Adult Health

## 2022-05-12 VITALS — BP 124/81 | HR 67 | Temp 97.8°F | Ht 66.0 in | Wt 206.0 lb

## 2022-05-12 DIAGNOSIS — R7303 Prediabetes: Secondary | ICD-10-CM

## 2022-05-12 DIAGNOSIS — E559 Vitamin D deficiency, unspecified: Secondary | ICD-10-CM | POA: Diagnosis not present

## 2022-05-12 DIAGNOSIS — E669 Obesity, unspecified: Secondary | ICD-10-CM

## 2022-05-12 DIAGNOSIS — R638 Other symptoms and signs concerning food and fluid intake: Secondary | ICD-10-CM

## 2022-05-12 DIAGNOSIS — Z6833 Body mass index (BMI) 33.0-33.9, adult: Secondary | ICD-10-CM

## 2022-05-12 MED ORDER — VITAMIN D (ERGOCALCIFEROL) 1.25 MG (50000 UNIT) PO CAPS: 50000.0000 [IU] | ORAL_CAPSULE | ORAL | 0 refills | Status: DC

## 2022-05-12 MED ORDER — BUPROPION HCL ER (SR) 150 MG PO TB12: 150.0000 mg | ORAL_TABLET | Freq: Two times a day (BID) | ORAL | 0 refills | Status: DC

## 2022-05-21 ENCOUNTER — Other Ambulatory Visit (INDEPENDENT_AMBULATORY_CARE_PROVIDER_SITE_OTHER): Payer: Self-pay | Admitting: Nurse Practitioner

## 2022-05-21 DIAGNOSIS — R638 Other symptoms and signs concerning food and fluid intake: Secondary | ICD-10-CM

## 2022-05-23 ENCOUNTER — Ambulatory Visit: Payer: 59 | Admitting: Psychology

## 2022-05-23 DIAGNOSIS — F4321 Adjustment disorder with depressed mood: Secondary | ICD-10-CM | POA: Diagnosis not present

## 2022-05-23 DIAGNOSIS — R638 Other symptoms and signs concerning food and fluid intake: Secondary | ICD-10-CM | POA: Insufficient documentation

## 2022-05-23 DIAGNOSIS — R7303 Prediabetes: Secondary | ICD-10-CM | POA: Insufficient documentation

## 2022-05-23 NOTE — Progress Notes (Signed)
[facility] Counselor/Therapist Progress Note  Patient ID: Sherry Marquez, MRN: [id number],    Date: 05/23/2022  Time Spent: 11:00am - 11:55am    55 minutes   Treatment Type: Individual Therapy  Reported Symptoms: stress  Mental Status Exam: Appearance:  Neat     Behavior: Appropriate  Motor: Normal  Speech/Language:  Normal Rate  Affect: Appropriate  Mood: normal  Thought process: normal  Thought content:   WNL  Sensory/Perceptual disturbances:   WNL  Orientation: oriented to person, place, and time/date  Attention: Good  Concentration: Good  Memory: WNL  Fund of knowledge:  Good  Insight:   Good  Judgment:  Good  Impulse Control: Good   Risk Assessment: Danger to Self:  No Self-injurious Behavior: No Danger to Others: No Duty to Warn:no Physical Aggression / Violence:No  Access to Firearms a concern: No  Gang Involvement:No   Subjective:   Pt present for face-to-face individual therapy via video Webex.  Pt consents to telehealth video session due to COVID 19 pandemic. Location of pt: home  Location of therapist: home office.   Pt talked about having a stressful week last week.   She found out her cat is diabetic and will need to be given insulin.   Pt's parents visited for pt's son's birthday and they had a good visit.   Pt has felt more motivated about getting things done in her home.   Pt has been decluttering things and feels good about her progress.  Pt wants to focus on continuing to declutter.   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   Pt talked about her diet.   She did not eat healthily last week but is getting back on track today.   Pt talked about her relationship with her husband.   His moods can permeate the atmosphere of the home.   Addressed how this impacts pt.    Pt is working on "finding her voice" with her husband.  Helped pt process her feelings and relationship dynamics.     Provided supportive therapy.    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  (Treatment Plan target date: 06/27/2022)  Pt is progressing toward treatment goals.   Pt wants a safe place to talk and to improve coping skills.  Pt would like to work on figuring out her own opinions and to communicate them.  Pt would like to increase confidence and self esteem.  Pt wants to learn to be assertive.    Plan to continue to meet every two weeks.    Levern Kalka, LCSW

## 2022-05-23 NOTE — Progress Notes (Signed)
Chief Complaint:   OBESITY Sherry Marquez is here to discuss her progress with her obesity treatment plan along with follow-up of her obesity related diagnoses. Sherry Marquez is on the Category 3 Plan and states she is following her eating plan approximately 90% of the time. Sherry Marquez states she is walking 30 minutes 4 times per week.  Today's visit was #: 58 Starting weight: 232 lbs Starting date: 04/28/2022 Today's weight: 206 lbs Today's date: 05/12/2022 Total lbs lost to date: 26 lbs Total lbs lost since last in-office visit: 4 lbs  Interim History:  Reviewed daily food, excellent tracking!  She drinks 90 oz of water per day.  She states -"I feel back on track".    Current weight 206 lbs and has set an interval goal of losing under <200lbs by end of day 2023.   Ultimate goal 160, size 10-12. She is a stay at home mom.   Subjective:   1. Vitamin D deficiency 02/28/2022, Vitamin D level 30.6. She is on weekly Ergocalciferol- denies N/V/Muscle [employer].  2. Abnormal craving She is on Buproprion SR 150 mg 1-2 tabs daily, increased to BID at last office visit due to increased cravings.   Blood pressure excellent at office visit.   3. Pre-diabetes Lab Results  Component Value Date   HGBA1C 5.5 09/20/2021   HGBA1C 5.7 02/24/2021   HGBA1C 5.7 10/29/2013    She has a family history of hyperlipidemia, both her father and brother.   Assessment/Plan:   1. Vitamin D deficiency Refill - Vitamin D, Ergocalciferol, (DRISDOL) 1.25 MG (50000 UNIT) CAPS capsule; Take 1 capsule (50,000 Units total) by mouth every 7 (seven) days.  Dispense: 4 capsule; Refill: 0  2. Abnormal craving Refill - buPROPion (WELLBUTRIN SR) 150 MG 12 hr tablet; Take 1 tablet (150 mg total) by mouth 2 (two) times daily.  Dispense: 60 tablet; Refill: 0  3. Pre-diabetes Continue Category Cat 3 Meal Plan.  4. Obesity with current BMI of 33.2 Sherry Marquez is currently in the action stage of change. As such, her goal is to  continue with weight loss efforts. She has agreed to the Category 3 Plan.   Exercise goals:  As is.   Behavioral modification strategies: increasing lean protein intake, decreasing simple carbohydrates, meal planning and cooking strategies, keeping healthy foods in the home, and planning for success.  Sherry Marquez has agreed to follow-up with our clinic in 4 weeks. She was informed of the importance of frequent follow-up visits to maximize her success with intensive lifestyle modifications for her multiple health conditions.   Objective:   Blood pressure 124/81, pulse 67, temperature 97.8 F (36.6 C), height 5\' 6"  (1.676 m), weight 206 lb (93.4 kg), SpO2 96 %. Body mass index is 33.25 kg/m.  General: Cooperative, alert, well developed, in no acute distress. HEENT: Conjunctivae and lids unremarkable. Cardiovascular: Regular rhythm.  Lungs: Normal work of breathing. Neurologic: No focal deficits.   Lab Results  Component Value Date   CREATININE 0.86 02/28/2022   BUN 23 02/28/2022   NA 141 02/28/2022   K 4.7 02/28/2022   CL 102 02/28/2022   CO2 22 02/28/2022   Lab Results  Component Value Date   ALT 18 02/28/2022   AST 18 02/28/2022   ALKPHOS 93 02/28/2022   BILITOT 0.3 02/28/2022   Lab Results  Component Value Date   HGBA1C 5.5 09/20/2021   HGBA1C 5.7 02/24/2021   HGBA1C 5.7 10/29/2013   Lab Results  Component Value Date   INSULIN  9.4 09/20/2021   INSULIN 12.2 04/28/2021   Lab Results  Component Value Date   TSH 2.340 04/28/2021   Lab Results  Component Value Date   CHOL 233 (H) 02/28/2022   HDL 56 02/28/2022   LDLCALC 160 (H) 02/28/2022   TRIG 94 02/28/2022   CHOLHDL 4 02/24/2021   Lab Results  Component Value Date   VD25OH 30.6 02/28/2022   VD25OH 42.9 09/20/2021   VD25OH 22.1 (L) 04/28/2021   Lab Results  Component Value Date   WBC 4.6 02/24/2021   HGB 15.0 02/24/2021   HCT 44.0 02/24/2021   MCV 89.8 02/24/2021   PLT 241.0 02/24/2021   Lab Results   Component Value Date   FERRITIN 58 09/05/2020    Attestation Statements:   Reviewed by clinician on day of visit: allergies, medications, problem list, medical history, surgical history, family history, social history, and previous encounter notes.  I, Davy Pique, RMA, am acting as Location manager for Mina Marble, NP.  I have reviewed the above documentation for accuracy and completeness, and I agree with the above. -  Anniemae Haberkorn d. Hays Dunnigan, NP-C

## 2022-06-08 ENCOUNTER — Encounter (INDEPENDENT_AMBULATORY_CARE_PROVIDER_SITE_OTHER): Payer: Self-pay | Admitting: Family Medicine

## 2022-06-08 ENCOUNTER — Ambulatory Visit (INDEPENDENT_AMBULATORY_CARE_PROVIDER_SITE_OTHER): Payer: 59 | Admitting: Family Medicine

## 2022-06-08 VITALS — BP 122/81 | HR 66 | Temp 97.8°F | Ht 66.0 in | Wt 210.0 lb

## 2022-06-08 DIAGNOSIS — E669 Obesity, unspecified: Secondary | ICD-10-CM

## 2022-06-08 DIAGNOSIS — E559 Vitamin D deficiency, unspecified: Secondary | ICD-10-CM

## 2022-06-08 DIAGNOSIS — Z6833 Body mass index (BMI) 33.0-33.9, adult: Secondary | ICD-10-CM | POA: Diagnosis not present

## 2022-06-08 DIAGNOSIS — F5089 Other specified eating disorder: Secondary | ICD-10-CM

## 2022-06-08 DIAGNOSIS — F3289 Other specified depressive episodes: Secondary | ICD-10-CM | POA: Diagnosis not present

## 2022-06-08 MED ORDER — VITAMIN D (ERGOCALCIFEROL) 1.25 MG (50000 UNIT) PO CAPS: 50000.0000 [IU] | ORAL_CAPSULE | ORAL | 0 refills | Status: DC

## 2022-06-08 MED ORDER — BUPROPION HCL ER (SR) 150 MG PO TB12: 150.0000 mg | ORAL_TABLET | Freq: Two times a day (BID) | ORAL | 0 refills | Status: DC

## 2022-06-09 ENCOUNTER — Ambulatory Visit (INDEPENDENT_AMBULATORY_CARE_PROVIDER_SITE_OTHER): Payer: 59 | Admitting: Psychology

## 2022-06-09 DIAGNOSIS — F4321 Adjustment disorder with depressed mood: Secondary | ICD-10-CM | POA: Diagnosis not present

## 2022-06-09 NOTE — Progress Notes (Signed)
[facility] Counselor/Therapist Progress Note  Patient ID: Sherry Marquez, MRN: [id number],    Date: 06/09/2022  Time Spent: 9:00am - 9:55am    55 minutes   Treatment Type: Individual Therapy  Reported Symptoms: stress  Mental Status Exam: Appearance:  Neat     Behavior: Appropriate  Motor: Normal  Speech/Language:  Normal Rate  Affect: Appropriate  Mood: normal  Thought process: normal  Thought content:   WNL  Sensory/Perceptual disturbances:   WNL  Orientation: oriented to person, place, and time/date  Attention: Good  Concentration: Good  Memory: WNL  Fund of knowledge:  Good  Insight:   Good  Judgment:  Good  Impulse Control: Good   Risk Assessment: Danger to Self:  No Self-injurious Behavior: No Danger to Others: No Duty to Warn:no Physical Aggression / Violence:No  Access to Firearms a concern: No  Gang Involvement:No   Subjective:   Pt present for face-to-face individual therapy via video Webex.  Pt consents to telehealth video session due to COVID 19 pandemic. Location of pt: home  Location of therapist: home office.   Pt talked about her struggles with her weight.  She had a weigh in yesterday at [facility] and had gained the weight she had lost the previous month.   Pt attributes this to stress eating.   Worked on stress management and emotional eating issues.  Addressed how hard it is to make healthy food choices with the holidays coming up.   Pt talked about the challenges with her puppy and training.   Addressed pt's frustrations. Pt talked about tending to be frugal with herself and not allowing herself to nurture herself and give to herself.  Helped pt process the issues and worked on ways to increase self care.    Provided supportive therapy.    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  (Treatment Plan target date: 06/27/2022)  Pt  is progressing toward treatment goals.   Pt wants a safe place to talk and to improve coping skills.  Pt would like to work on figuring out her own opinions and to communicate them.  Pt would like to increase confidence and self esteem.  Pt wants to learn to be assertive.    Plan to continue to meet every two weeks.    Adrean Heitz, LCSW

## 2022-06-20 NOTE — Progress Notes (Unsigned)
Chief Complaint:   OBESITY Sherry Marquez is here to discuss her progress with her obesity treatment plan along with follow-up of her obesity related diagnoses. Sherry Marquez is on the Category 3 Plan and states she is following her eating plan approximately 60% of the time. Sherry Marquez states she is walking for 20-40 minutes 5 times per week.  Today's visit was #: 20 Starting weight: 232 lbs Starting date: 04/28/2021 Today's weight: 210 lbs Today's date: 06/08/2022 Total lbs lost to date: 22 Total lbs lost since last in-office visit: 0  Interim History: Sherry Marquez notes she is more stressed lately with family stressors and her cat was diagnosed with diabetes.  She is working to get back on track with her weight loss.  Subjective:   1. Vitamin D deficiency Sherry Marquez's last vitamin D level was 30.6.  She is taking vitamin D 50,000 IU weekly with no side effects.  2. Other depression, with emotional eating behaviors Sherry Marquez is taking Wellbutrin SR 150 mg twice daily with no side effects noted.  She feels it does help some, but she notes difficultly with increased stressors lately.  She is working on decreasing emotional eating behaviors.  Assessment/Plan:   1. Vitamin D deficiency We will refill prescription Vitamin D for 1 month. Sherry Marquez will follow-up for routine testing of Vitamin D, at least 2-3 times per year to avoid over-replacement.  - Vitamin D, Ergocalciferol, (DRISDOL) 1.25 MG (50000 UNIT) CAPS capsule; Take 1 capsule (50,000 Units total) by mouth every 7 (seven) days.  Dispense: 4 capsule; Refill: 0  2. Other depression, with emotional eating behaviors Sherry Marquez will continue Wellbutrin SR 150 mg BID, and we will refill for 1 month.   - buPROPion (WELLBUTRIN SR) 150 MG 12 hr tablet; Take 1 tablet (150 mg total) by mouth 2 (two) times daily.  Dispense: 60 tablet; Refill: 0  3. Obesity with current BMI of 33.9 Sherry Marquez is currently in the action stage of change. As such, her goal is to continue with weight  loss efforts. She has agreed to the Category 3 Plan.   Exercise goals: As is.   Behavioral modification strategies: increasing lean protein intake, decreasing simple carbohydrates, meal planning and cooking strategies, emotional eating strategies, holiday eating strategies , celebration eating strategies, and avoiding temptations.  Sherry Marquez has agreed to follow-up with our clinic in 3 to 4 weeks. She was informed of the importance of frequent follow-up visits to maximize her success with intensive lifestyle modifications for her multiple health conditions.   Objective:   Blood pressure 122/81, pulse 66, temperature 97.8 F (36.6 C), height 5\' 6"  (1.676 m), weight 210 lb (95.3 kg), SpO2 99 %. Body mass index is 33.89 kg/m.  General: Cooperative, alert, well developed, in no acute distress. HEENT: Conjunctivae and lids unremarkable. Cardiovascular: Regular rhythm.  Lungs: Normal work of breathing. Neurologic: No focal deficits.   Lab Results  Component Value Date   CREATININE 0.86 02/28/2022   BUN 23 02/28/2022   NA 141 02/28/2022   K 4.7 02/28/2022   CL 102 02/28/2022   CO2 22 02/28/2022   Lab Results  Component Value Date   ALT 18 02/28/2022   AST 18 02/28/2022   ALKPHOS 93 02/28/2022   BILITOT 0.3 02/28/2022   Lab Results  Component Value Date   HGBA1C 5.5 09/20/2021   HGBA1C 5.7 02/24/2021   HGBA1C 5.7 10/29/2013   Lab Results  Component Value Date   INSULIN 9.4 09/20/2021   INSULIN 12.2 04/28/2021   Lab  Results  Component Value Date   TSH 2.340 04/28/2021   Lab Results  Component Value Date   CHOL 233 (H) 02/28/2022   HDL 56 02/28/2022   LDLCALC 160 (H) 02/28/2022   TRIG 94 02/28/2022   CHOLHDL 4 02/24/2021   Lab Results  Component Value Date   VD25OH 30.6 02/28/2022   VD25OH 42.9 09/20/2021   VD25OH 22.1 (L) 04/28/2021   Lab Results  Component Value Date   WBC 4.6 02/24/2021   HGB 15.0 02/24/2021   HCT 44.0 02/24/2021   MCV 89.8 02/24/2021    PLT 241.0 02/24/2021   Lab Results  Component Value Date   FERRITIN 58 09/05/2020   Attestation Statements:   Reviewed by clinician on day of visit: allergies, medications, problem list, medical history, surgical history, family history, social history, and previous encounter notes.   I, Burt Knack, am acting as transcriptionist for Quillian Quince, MD.  I have reviewed the above documentation for accuracy and completeness, and I agree with the above. -  Quillian Quince, MD

## 2022-06-28 ENCOUNTER — Ambulatory Visit: Payer: 59 | Admitting: Psychology

## 2022-06-28 DIAGNOSIS — F4321 Adjustment disorder with depressed mood: Secondary | ICD-10-CM | POA: Diagnosis not present

## 2022-06-28 NOTE — Progress Notes (Signed)
[facility] Counselor Initial Adult Exam  Name: Sherry Marquez Date: 06/28/2022 MRN: [id number] DOB: 17-Mar-1971 PCP: Sherry Macadam, MD (Inactive)  Time spent: 11:00am-11:55am   55 minutes  Guardian/Payee:  n/a    Paperwork requested: No   Reason for Visit /Presenting Problem: Pt present for face-to-face initial assessment update via video Webex.  Pt consents to telehealth video session due to COVID 19 pandemic. Location of pt: home Location of therapist: home office.  Pt is 51 yo white female.  Pt lives with her husband and two sons ages 15 and 32.   Pt states her 66 yo is an anxious child.  The family has had multiple losses the past couple of years.   Pt's in laws died of COVID and her brother in law died from suicide.   Pt tends to take on other people's emotions and puts hers on the back burner.   Pt wants to learn to deal with her emotions better.  Pt is feeling overwhelmed bc of the holidays.   Reviewed pt's treatment plan for annual update.  Updated pt's treatment plan and IA.  Pt participated in setting treatment goals.  Pt would like to manage stress better and improve coping skills.  Plan to continue to meet every two weeks.     Mental Status Exam: Appearance:   Casual     Behavior:  Appropriate  Motor:  Normal  Speech/Language:   Normal Rate  Affect:  Appropriate  Mood:  normal  Thought process:  normal  Thought content:    WNL  Sensory/Perceptual disturbances:    WNL  Orientation:  oriented to person, place, time/date, and situation  Attention:  Good  Concentration:  Good  Memory:  WNL  Fund of knowledge:   Good  Insight:    Good  Judgment:   Good  Impulse Control:  Good    Reported Symptoms:  stress, sadness  Risk Assessment: Danger to Self:  No Self-injurious Behavior: No Danger to Others: No Duty to Warn:no Physical Aggression / Violence:No  Access to Firearms a concern: No  Gang Involvement:No  Patient / guardian was educated  about steps to take if suicide or homicide risk level increases between visits: n/a While future psychiatric events cannot be accurately predicted, the patient does not currently require acute inpatient psychiatric care and does not currently meet [facility] involuntary commitment criteria.  Substance Abuse History: Current substance abuse: No     Past Psychiatric History:   No previous psychological problems have been observed Outpatient Providers:none previously History of Psych Hospitalization: No  Psychological Testing:  n/a    Abuse History:  Victim of: No.,  n/a    Report needed: No. Victim of Neglect:No. Perpetrator of  n/a   Witness / Exposure to Domestic Violence: No   Protective Services Involvement: No  Witness to [employer] Violence:  No   Family History:  Family History  Problem Relation Age of Onset   Atrial fibrillation Mother    Hypothyroidism Mother    Hypertension Mother    Heart disease Mother    Sleep apnea Mother    Obesity Mother    Heart disease Father        defibrillator   Hypertension Father    Heart attack Father 28   CAD Father        4-way bypass   High Cholesterol Father    Sleep apnea Father    Obesity Father    Hypertension Brother  Lymphoma Maternal Grandmother 65   Congestive Heart Failure Maternal Grandmother    Stroke Maternal Grandfather 31   Cancer Paternal Grandmother        mets to lung   Heart attack Paternal Grandfather        early death    Living situation: the patient lives with her husband and two sons.    Pt grew up with both parents and an older brother.   Pt states that overall her childhood was good and her parents were supportive.   Pt is not very close to her brother.   Pt states she is close to her parents.   Family history of mental health issues: Father has anxiety.  Brother is alcoholic but has been sober for 6 years.   No child abuse.    Sexual Orientation: Straight  Relationship Status: married   Name of spouse / other:pt and husband Sherry Marquez have been married since 2003.  If a parent, number of children / ages:pt has two sons ages   Support Systems: spouse parents  Financial Stress:  No   Income/Employment/Disability: Employment  Financial planner: No   Educational History: Education: Risk manager: Protestant  Any cultural differences that may affect / interfere with treatment:  not applicable   Recreation/Hobbies: gardening  Stressors: Loss of in laws.  Pt has had several losses in the past couple of years.     Strengths: Supportive Relationships, Family, Hopefulness, Self Advocate, and Able to Communicate Effectively  Barriers:  none   Legal History: Pending legal issue / charges: The patient has no significant history of legal issues. History of legal issue / charges:  n/a  Medical History/Surgical History: reviewed Past Medical History:  Diagnosis Date   Allergy    Anxiety    Back pain    COVID    Diabetes (HCC)    Gestational   Eczema    Environmental and seasonal allergies    High cholesterol    History of gestational diabetes    Hx of blood clots    Joint pain    Lack of energy    LUMBAR SPRAIN AND STRAIN 12/05/2009   Qualifier: Diagnosis of  By: Clent Ridges MD, Sherry Marquez    Obesity    Osteoarthritis    Pneumonia    Right ovarian cyst    Rosacea    SOB (shortness of breath)     Past Surgical History:  Procedure Laterality Date   CESAREAN SECTION  03-15-2005   dr Audie Box  @[facility]    LAPAROSCOPY N/A 05/20/2019   Procedure: LAPAROSCOPY DIAGNOSTIC;  Surgeon: 07/20/2019, MD;  Location: [facility];  Service: Gynecology;  Laterality: N/A;   WISDOM TOOTH EXTRACTION  2001    Medications: Current Outpatient Medications  Medication Sig Dispense Refill   buPROPion (WELLBUTRIN SR) 150 MG 12 hr tablet Take 1 tablet (150 mg total) by mouth 2 (two) times daily. 60 tablet 0   cetirizine (ZYRTEC) 10 MG  tablet Take 10 mg by mouth daily.     ibuprofen (ADVIL) 400 MG tablet Take 400 mg by mouth every 6 (six) hours as needed.     POTASSIUM CHLORIDE PO Take 640 mg by mouth.     Vitamin D, Ergocalciferol, (DRISDOL) 1.25 MG (50000 UNIT) CAPS capsule Take 1 capsule (50,000 Units total) by mouth every 7 (seven) days. 4 capsule 0   No current facility-administered medications for this visit.    Allergies  Allergen Reactions   Cefotan [  Cefotetan] Rash    Developed rash during IV infusion- dose completed -benadryl given, no other issues occured    Diagnoses:  F43.21  Plan of Care:  Treatment Plan(treatment plan target date 06/29/2023) Client Abilities/Strengths  Pt is bright, engaging, and motivated for therapy.   Client Treatment Preferences  Individual therapy.  Client Statement of Needs  Improve coping skills.  Symptoms  Depressed or irritable mood. Low self-esteem. Unresolved grief issues.   Problems Addressed  Unipolar Depression Goals 1. Alleviate depressive symptoms and return to previous level of effective functioning. 2. Appropriately grieve the loss in order to normalize mood and to return to previously adaptive level of functioning. Objective Learn and implement behavioral strategies to overcome depression. Target Date: 2023-06-29 Frequency: Biweekly  Progress: 40 Modality: individual  Related Interventions Engage the client in "behavioral activation," increasing his/her activity level and contact with sources of reward, while identifying processes that inhibit activation.  Use behavioral techniques such as instruction, rehearsal, role-playing, role reversal, as needed, to facilitate activity in the client's daily life; reinforce success. Assist the client in developing skills that increase the likelihood of deriving pleasure from behavioral activation (e.g., assertiveness skills, developing an exercise plan, less internal/more external focus, increased social involvement);  reinforce success. Objective Identify important people in life, past and present, and describe the quality, good and poor, of those relationships. Target Date: 2023-06-29 Frequency: Biweekly  Progress: 40 Modality: individual  Related Interventions Conduct Interpersonal Therapy beginning with the assessment of the client's "interpersonal inventory" of important past and present relationships; develop a case formulation linking depression to grief, interpersonal role disputes, role transitions, and/or interpersonal deficits). Objective Learn and implement problem-solving and decision-making skills. Target Date: 2023-06-29 Frequency: Biweekly  Progress: 40 Modality: individual  Related Interventions Conduct Problem-Solving Therapy using techniques such as psychoeducation, modeling, and role-playing to teach client problem-solving skills (i.e., defining a problem specifically, generating possible solutions, evaluating the pros and cons of each solution, selecting and implementing a plan of action, evaluating the efficacy of the plan, accepting or revising the plan); role-play application of the problem-solving skill to a real life issue. Encourage in the client the development of a positive problem orientation in which problems and solving them are viewed as a natural part of life and not something to be feared, despaired, or avoided. 3. Develop healthy interpersonal relationships that lead to the alleviation and help prevent the relapse of depression. 4. Develop healthy thinking patterns and beliefs about self, others, and the world that lead to the alleviation and help prevent the relapse of depression. 5. Recognize, accept, and cope with feelings of depression. Diagnosis F43.21 Conditions For Discharge Achievement of treatment goals and objectives    Clint Bolder, LCSW

## 2022-07-06 ENCOUNTER — Ambulatory Visit (INDEPENDENT_AMBULATORY_CARE_PROVIDER_SITE_OTHER): Payer: 59 | Admitting: Family Medicine

## 2022-07-06 ENCOUNTER — Encounter (INDEPENDENT_AMBULATORY_CARE_PROVIDER_SITE_OTHER): Payer: Self-pay | Admitting: Family Medicine

## 2022-07-06 VITALS — BP 134/82 | HR 70 | Temp 98.0°F | Ht 66.0 in | Wt 215.0 lb

## 2022-07-06 DIAGNOSIS — E669 Obesity, unspecified: Secondary | ICD-10-CM | POA: Diagnosis not present

## 2022-07-06 DIAGNOSIS — E559 Vitamin D deficiency, unspecified: Secondary | ICD-10-CM | POA: Diagnosis not present

## 2022-07-06 DIAGNOSIS — Z6834 Body mass index (BMI) 34.0-34.9, adult: Secondary | ICD-10-CM | POA: Diagnosis not present

## 2022-07-06 DIAGNOSIS — F3289 Other specified depressive episodes: Secondary | ICD-10-CM | POA: Diagnosis not present

## 2022-07-06 MED ORDER — VITAMIN D (ERGOCALCIFEROL) 1.25 MG (50000 UNIT) PO CAPS: 50000.0000 [IU] | ORAL_CAPSULE | ORAL | 0 refills | Status: DC

## 2022-07-06 MED ORDER — BUPROPION HCL ER (SR) 200 MG PO TB12: 200.0000 mg | ORAL_TABLET | Freq: Two times a day (BID) | ORAL | 0 refills | Status: DC

## 2022-07-14 ENCOUNTER — Ambulatory Visit: Payer: 59 | Admitting: Psychology

## 2022-07-14 DIAGNOSIS — F4321 Adjustment disorder with depressed mood: Secondary | ICD-10-CM

## 2022-07-14 NOTE — Progress Notes (Signed)
[facility] Counselor/Therapist Progress Note  Patient ID: Sherry Marquez, MRN: [id number],    Date: 07/14/2022  Time Spent: 9:00am - 9:55am   55 minutes   Treatment Type: Individual Therapy  Reported Symptoms: stress  Mental Status Exam: Appearance:  Casual     Behavior: Appropriate  Motor: Normal  Speech/Language:  Normal Rate  Affect: Appropriate  Mood: normal  Thought process: normal  Thought content:   WNL  Sensory/Perceptual disturbances:   WNL  Orientation: oriented to person, place, time/date, and situation  Attention: Good  Concentration: Good  Memory: WNL  Fund of knowledge:  Good  Insight:   Good  Judgment:  Good  Impulse Control: Good   Risk Assessment: Danger to Self:  No Self-injurious Behavior: No Danger to Others: No Duty to Warn:no Physical Aggression / Violence:No  Access to Firearms a concern: No  Gang Involvement:No   Subjective: Pt present for face-to-face individual therapy via video Webex.  Pt consents to telehealth video session due to COVID 19 pandemic. Location of pt: home Location of therapist: home office.   Pt talked about celebrating her 51th birthday.   She had a good birthday celebration.   Thanksgiving also went well. Pt is trying to get ready for Christmas now.  She states she tends to procrastinate on shopping for gifts so things can get stressful as the holidays near. Pt talked about her husband who has been dealing with depression for the past couple of years.   Pt tries to support him but feels fatigued by her husband's emotional needs.   Pt talked about frustrations with her weight.  She has gained 5 lbs and feels like she is stress eating.  Worked on healthy coping skills.  Worked on self care strategies.  Provided supportive therapy.  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  Treatment Plan(treatment plan target date  06/29/2023) Client Abilities/Strengths  Pt is bright, engaging, and motivated for therapy.   Client Treatment Preferences  Individual therapy.  Client Statement of Needs  Improve coping skills.  Symptoms  Depressed or irritable mood. Low self-esteem. Unresolved grief issues.   Problems Addressed  Unipolar Depression Goals 1. Alleviate depressive symptoms and return to previous level of effective functioning. 2. Appropriately grieve the loss in order to normalize mood and to return to previously adaptive level of functioning. Objective Learn and implement behavioral strategies to overcome depression. Target Date: 2023-06-29 Frequency: Biweekly  Progress: 40 Modality: individual  Related Interventions Engage the client in "behavioral activation," increasing his/her activity level and contact with sources of reward, while identifying processes that inhibit activation.  Use behavioral techniques such as instruction, rehearsal, role-playing, role reversal, as needed, to facilitate activity in the client's daily life; reinforce success. Assist the client in developing skills that increase the likelihood of deriving pleasure from behavioral activation (e.g., assertiveness skills, developing an exercise plan, less internal/more external focus, increased social involvement); reinforce success. Objective Identify important people in life, past and present, and describe the quality, good and poor, of those relationships. Target Date: 2023-06-29 Frequency: Biweekly  Progress: 40 Modality: individual  Related Interventions Conduct Interpersonal Therapy beginning with the assessment of the client's "interpersonal inventory" of important past and present relationships; develop a case formulation linking depression to grief, interpersonal role disputes, role transitions, and/or interpersonal deficits). Objective Learn and implement problem-solving and decision-making skills. Target Date: 2023-06-29  Frequency: Biweekly  Progress: 40 Modality: individual  Related  Interventions Conduct Problem-Solving Therapy using techniques such as psychoeducation, modeling, and role-playing to teach client problem-solving skills (i.e., defining a problem specifically, generating possible solutions, evaluating the pros and cons of each solution, selecting and implementing a plan of action, evaluating the efficacy of the plan, accepting or revising the plan); role-play application of the problem-solving skill to a real life issue. Encourage in the client the development of a positive problem orientation in which problems and solving them are viewed as a natural part of life and not something to be feared, despaired, or avoided. 3. Develop healthy interpersonal relationships that lead to the alleviation and help prevent the relapse of depression. 4. Develop healthy thinking patterns and beliefs about self, others, and the world that lead to the alleviation and help prevent the relapse of depression. 5. Recognize, accept, and cope with feelings of depression. Diagnosis F43.21 Conditions For Discharge Achievement of treatment goals and objectives   Clint Bolder, LCSW

## 2022-07-18 NOTE — Progress Notes (Unsigned)
Chief Complaint:   OBESITY Sherry Marquez is here to discuss her progress with her obesity treatment plan along with follow-up of her obesity related diagnoses. Sherry Marquez is on the Category 3 Plan and states she is following her eating plan approximately 50% of the time. Sherry Marquez states she is walking for 20 minutes 5 times per week.  Today's visit was #: 21 Starting weight: 232 lbs Starting date: 04/28/2021 Today's weight: 215 lbs Today's date: 07/06/2022 Total lbs lost to date: 17 Total lbs lost since last in-office visit: 0  Interim History: Sherry Marquez has struggled over Thanksgiving but she is already working on getting back on track. She is open to discussing Christmas eating strategies.   Subjective:   1. Vitamin D deficiency Sherry Marquez is on Vitamin D, but her level was not yet at goal.   2. Other depression, with emotional eating behaviors Sherry Marquez's stress has been increasing recently. She is open to increasing her medications to help her decreasing emotional eating behaviors.   Assessment/Plan:   1. Vitamin D deficiency Nary will continue prescription Vitamin D 50,000 IU every week, and we will refill for 1 month.   - Vitamin D, Ergocalciferol, (DRISDOL) 1.25 MG (50000 UNIT) CAPS capsule; Take 1 capsule (50,000 Units total) by mouth every 7 (seven) days.  Dispense: 4 capsule; Refill: 0  2. Other depression, with emotional eating behaviors Sherry Marquez agreed to increase Wellbutrin SR to 200 mg BID with no refills.   - buPROPion (WELLBUTRIN SR) 200 MG 12 hr tablet; Take 1 tablet (200 mg total) by mouth 2 (two) times daily.  Dispense: 60 tablet; Refill: 0  3. Obesity, current BMI 34.8 Sherry Marquez is currently in the action stage of change. As such, her goal is to continue with weight loss efforts. She has agreed to the Category 3 Plan.   Exercise goals: As is.   Behavioral modification strategies: holiday eating strategies .  Sherry Marquez has agreed to follow-up with our clinic in 5 weeks. She was informed  of the importance of frequent follow-up visits to maximize her success with intensive lifestyle modifications for her multiple health conditions.   Objective:   Blood pressure 134/82, pulse 70, temperature 98 F (36.7 C), height 5\' 6"  (1.676 m), weight 215 lb (97.5 kg), SpO2 96 %. Body mass index is 34.7 kg/m.  General: Cooperative, alert, well developed, in no acute distress. HEENT: Conjunctivae and lids unremarkable. Cardiovascular: Regular rhythm.  Lungs: Normal work of breathing. Neurologic: No focal deficits.   Lab Results  Component Value Date   CREATININE 0.86 02/28/2022   BUN 23 02/28/2022   NA 141 02/28/2022   K 4.7 02/28/2022   CL 102 02/28/2022   CO2 22 02/28/2022   Lab Results  Component Value Date   ALT 18 02/28/2022   AST 18 02/28/2022   ALKPHOS 93 02/28/2022   BILITOT 0.3 02/28/2022   Lab Results  Component Value Date   HGBA1C 5.5 09/20/2021   HGBA1C 5.7 02/24/2021   HGBA1C 5.7 10/29/2013   Lab Results  Component Value Date   INSULIN 9.4 09/20/2021   INSULIN 12.2 04/28/2021   Lab Results  Component Value Date   TSH 2.340 04/28/2021   Lab Results  Component Value Date   CHOL 233 (H) 02/28/2022   HDL 56 02/28/2022   LDLCALC 160 (H) 02/28/2022   TRIG 94 02/28/2022   CHOLHDL 4 02/24/2021   Lab Results  Component Value Date   VD25OH 30.6 02/28/2022   VD25OH 42.9 09/20/2021  VD25OH 22.1 (L) 04/28/2021   Lab Results  Component Value Date   WBC 4.6 02/24/2021   HGB 15.0 02/24/2021   HCT 44.0 02/24/2021   MCV 89.8 02/24/2021   PLT 241.0 02/24/2021   Lab Results  Component Value Date   FERRITIN 58 09/05/2020   Attestation Statements:   Reviewed by clinician on day of visit: allergies, medications, problem list, medical history, surgical history, family history, social history, and previous encounter notes.   I, Burt Knack, am acting as transcriptionist for Quillian Quince, MD.  I have reviewed the above documentation for accuracy  and completeness, and I agree with the above. -  Quillian Quince, MD

## 2022-07-26 ENCOUNTER — Ambulatory Visit: Payer: 59 | Admitting: Psychology

## 2022-07-26 DIAGNOSIS — F4321 Adjustment disorder with depressed mood: Secondary | ICD-10-CM | POA: Diagnosis not present

## 2022-07-26 NOTE — Progress Notes (Signed)
[facility] Counselor/Therapist Progress Note  Patient ID: Sherry Marquez, MRN: [id number],    Date: 07/26/2022  Time Spent: 11:00am - 11:55am   55 minutes   Treatment Type: Individual Therapy  Reported Symptoms: stress  Mental Status Exam: Appearance:  Casual     Behavior: Appropriate  Motor: Normal  Speech/Language:  Normal Rate  Affect: Appropriate  Mood: normal  Thought process: normal  Thought content:   WNL  Sensory/Perceptual disturbances:   WNL  Orientation: oriented to person, place, time/date, and situation  Attention: Good  Concentration: Good  Memory: WNL  Fund of knowledge:  Good  Insight:   Good  Judgment:  Good  Impulse Control: Good   Risk Assessment: Danger to Self:  No Self-injurious Behavior: No Danger to Others: No Duty to Warn:no Physical Aggression / Violence:No  Access to Firearms a concern: No  Gang Involvement:No   Subjective: Pt present for face-to-face individual therapy via video Webex.  Pt consents to telehealth video session due to COVID 19 pandemic. Location of pt: home Location of therapist: home office.   Pt talked about the holidays.   Her parents are sick with COVID so they may not be able to attend Christmas.   Pt has been busy getting ready for Christmas and has most of her shopping done. Pt talked about her self esteem.  She feels like she has "good self esteem in her core" but does not like some of her behavior.  She feels like she "acts weak".   She feels like she is "weak" in relationships with people.   Pt is also unhappy with her eating behavior.   Pt states she tends to avoid conflict.  Her husband is a strong and controlling person and pt tends to give in to him.  Pt wants to work on "finding her voice" with her husband.  Pt's husband tells her he wants her to be more assertive but then gets mad when she has tried to be assertive in the past.  Helped pt process her feelings and relationship dynamics.   Encouraged  pt to journal.  She feels she has resentments that need to get unpacked.   Worked on self care strategies.  Provided supportive therapy.  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  Treatment Plan(treatment plan target date 06/29/2023) Client Abilities/Strengths  Pt is bright, engaging, and motivated for therapy.   Client Treatment Preferences  Individual therapy.  Client Statement of Needs  Improve coping skills.  Symptoms  Depressed or irritable mood. Low self-esteem. Unresolved grief issues.   Problems Addressed  Unipolar Depression Goals 1. Alleviate depressive symptoms and return to previous level of effective functioning. 2. Appropriately grieve the loss in order to normalize mood and to return to previously adaptive level of functioning. Objective Learn and implement behavioral strategies to overcome depression. Target Date: 2023-06-29 Frequency: Biweekly  Progress: 40 Modality: individual  Related Interventions Engage the client in "behavioral activation," increasing his/her activity level and contact with sources of reward, while identifying processes that inhibit activation.  Use behavioral techniques such as instruction, rehearsal, role-playing, role reversal, as needed, to facilitate activity in the client's daily life; reinforce success. Assist the client in developing skills that increase the likelihood of deriving pleasure from behavioral activation (e.g., assertiveness skills, developing an exercise plan, less internal/more external focus, increased social involvement); reinforce success. Objective Identify important people in life, past and present, and describe the  quality, good and poor, of those relationships. Target Date: 2023-06-29 Frequency: Biweekly  Progress: 40 Modality: individual  Related Interventions Conduct Interpersonal Therapy beginning with the assessment of  the client's "interpersonal inventory" of important past and present relationships; develop a case formulation linking depression to grief, interpersonal role disputes, role transitions, and/or interpersonal deficits). Objective Learn and implement problem-solving and decision-making skills. Target Date: 2023-06-29 Frequency: Biweekly  Progress: 40 Modality: individual  Related Interventions Conduct Problem-Solving Therapy using techniques such as psychoeducation, modeling, and role-playing to teach client problem-solving skills (i.e., defining a problem specifically, generating possible solutions, evaluating the pros and cons of each solution, selecting and implementing a plan of action, evaluating the efficacy of the plan, accepting or revising the plan); role-play application of the problem-solving skill to a real life issue. Encourage in the client the development of a positive problem orientation in which problems and solving them are viewed as a natural part of life and not something to be feared, despaired, or avoided. 3. Develop healthy interpersonal relationships that lead to the alleviation and help prevent the relapse of depression. 4. Develop healthy thinking patterns and beliefs about self, others, and the world that lead to the alleviation and help prevent the relapse of depression. 5. Recognize, accept, and cope with feelings of depression. Diagnosis F43.21 Conditions For Discharge Achievement of treatment goals and objectives   Salomon Fick, LCSW

## 2022-08-08 ENCOUNTER — Other Ambulatory Visit (INDEPENDENT_AMBULATORY_CARE_PROVIDER_SITE_OTHER): Payer: Self-pay | Admitting: Family Medicine

## 2022-08-08 DIAGNOSIS — F3289 Other specified depressive episodes: Secondary | ICD-10-CM

## 2022-08-09 ENCOUNTER — Encounter (INDEPENDENT_AMBULATORY_CARE_PROVIDER_SITE_OTHER): Payer: Self-pay | Admitting: Family Medicine

## 2022-08-09 ENCOUNTER — Ambulatory Visit (INDEPENDENT_AMBULATORY_CARE_PROVIDER_SITE_OTHER): Payer: 59 | Admitting: Family Medicine

## 2022-08-09 VITALS — BP 159/92 | HR 63 | Temp 98.4°F | Ht 66.0 in | Wt 216.0 lb

## 2022-08-09 DIAGNOSIS — E669 Obesity, unspecified: Secondary | ICD-10-CM

## 2022-08-09 DIAGNOSIS — F3289 Other specified depressive episodes: Secondary | ICD-10-CM

## 2022-08-09 DIAGNOSIS — E559 Vitamin D deficiency, unspecified: Secondary | ICD-10-CM

## 2022-08-09 DIAGNOSIS — Z6834 Body mass index (BMI) 34.0-34.9, adult: Secondary | ICD-10-CM

## 2022-08-09 DIAGNOSIS — R030 Elevated blood-pressure reading, without diagnosis of hypertension: Secondary | ICD-10-CM

## 2022-08-09 DIAGNOSIS — F32A Depression, unspecified: Secondary | ICD-10-CM | POA: Insufficient documentation

## 2022-08-09 MED ORDER — BUPROPION HCL ER (SR) 200 MG PO TB12: 200.0000 mg | ORAL_TABLET | Freq: Two times a day (BID) | ORAL | 0 refills | Status: DC

## 2022-08-09 MED ORDER — VITAMIN D (ERGOCALCIFEROL) 1.25 MG (50000 UNIT) PO CAPS: 50000.0000 [IU] | ORAL_CAPSULE | ORAL | 0 refills | Status: DC

## 2022-08-12 ENCOUNTER — Ambulatory Visit: Payer: 59 | Admitting: Psychology

## 2022-08-12 DIAGNOSIS — F4321 Adjustment disorder with depressed mood: Secondary | ICD-10-CM

## 2022-08-12 NOTE — Progress Notes (Signed)
[facility] Counselor/Therapist Progress Note  Patient ID: Sherry Marquez, MRN: [id number],    Date: 08/12/2022  Time Spent: 11:00am - 11:55am   55 minutes   Treatment Type: Individual Therapy  Reported Symptoms: stress  Mental Status Exam: Appearance:  Casual     Behavior: Appropriate  Motor: Normal  Speech/Language:  Normal Rate  Affect: Appropriate  Mood: normal  Thought process: normal  Thought content:   WNL  Sensory/Perceptual disturbances:   WNL  Orientation: oriented to person, place, time/date, and situation  Attention: Good  Concentration: Good  Memory: WNL  Fund of knowledge:  Good  Insight:   Good  Judgment:  Good  Impulse Control: Good   Risk Assessment: Danger to Self:  No Self-injurious Behavior: No Danger to Others: No Duty to Warn:no Physical Aggression / Violence:No  Access to Firearms a concern: No  Gang Involvement:No   Subjective: Pt present for face-to-face individual therapy via video Webex.  Pt consents to telehealth video session due to COVID 19 pandemic. Location of pt: home Location of therapist: home office.   Pt talked about the holidays.   Pt's parents weren't able to visit for Christmas bc they had COVID.   Addressed how disappointing this was.   Pt talked about getting back on track regarding her weight loss plan in the new year.  She is going to focus on nutrition and exercise.  Pt has been beginning to journal about her anger and resentment.  She is noticing that she has buried a lot of those feelings.   Pt feels like her husband does not view her as capable.  Pt feels like she has missed time with her parents.  Helped pt process her feelings.  She has resentments toward her husband.  Addressed how pt can have some conversations with her husband about her feelings.  Helped pt process her feelings and relationship dynamics.   Encouraged pt to continue to journal.  She has resentments that need to get unpacked.   Worked on  self care strategies.  Provided supportive therapy.  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  Treatment Plan(treatment plan target date 06/29/2023) Client Abilities/Strengths  Pt is bright, engaging, and motivated for therapy.   Client Treatment Preferences  Individual therapy.  Client Statement of Needs  Improve coping skills.  Symptoms  Depressed or irritable mood. Low self-esteem. Unresolved grief issues.   Problems Addressed  Unipolar Depression Goals 1. Alleviate depressive symptoms and return to previous level of effective functioning. 2. Appropriately grieve the loss in order to normalize mood and to return to previously adaptive level of functioning. Objective Learn and implement behavioral strategies to overcome depression. Target Date: 2023-06-29 Frequency: Biweekly  Progress: 40 Modality: individual  Related Interventions Engage the client in "behavioral activation," increasing his/her activity level and contact with sources of reward, while identifying processes that inhibit activation.  Use behavioral techniques such as instruction, rehearsal, role-playing, role reversal, as needed, to facilitate activity in the client's daily life; reinforce success. Assist the client in developing skills that increase the likelihood of deriving pleasure from behavioral activation (e.g., assertiveness skills, developing an exercise plan, less internal/more external focus, increased social involvement); reinforce success. Objective Identify important people in life, past and present, and describe the quality, good and poor, of those relationships. Target Date: 2023-06-29 Frequency: Biweekly  Progress: 40 Modality: individual  Related Interventions Conduct Interpersonal Therapy beginning with the assessment of  the client's "interpersonal inventory" of important past and present relationships;  develop a case formulation linking depression to grief, interpersonal role disputes, role transitions, and/or interpersonal deficits). Objective Learn and implement problem-solving and decision-making skills. Target Date: 2023-06-29 Frequency: Biweekly  Progress: 40 Modality: individual  Related Interventions Conduct Problem-Solving Therapy using techniques such as psychoeducation, modeling, and role-playing to teach client problem-solving skills (i.e., defining a problem specifically, generating possible solutions, evaluating the pros and cons of each solution, selecting and implementing a plan of action, evaluating the efficacy of the plan, accepting or revising the plan); role-play application of the problem-solving skill to a real life issue. Encourage in the client the development of a positive problem orientation in which problems and solving them are viewed as a natural part of life and not something to be feared, despaired, or avoided. 3. Develop healthy interpersonal relationships that lead to the alleviation and help prevent the relapse of depression. 4. Develop healthy thinking patterns and beliefs about self, others, and the world that lead to the alleviation and help prevent the relapse of depression. 5. Recognize, accept, and cope with feelings of depression. Diagnosis F43.21 Conditions For Discharge Achievement of treatment goals and objectives   Clint Bolder, LCSW

## 2022-08-19 ENCOUNTER — Ambulatory Visit: Payer: 59 | Admitting: Family

## 2022-08-19 ENCOUNTER — Encounter: Payer: Self-pay | Admitting: Family

## 2022-08-19 VITALS — BP 132/85 | HR 78 | Temp 100.4°F | Ht 66.0 in | Wt 220.5 lb

## 2022-08-19 DIAGNOSIS — J029 Acute pharyngitis, unspecified: Secondary | ICD-10-CM

## 2022-08-19 DIAGNOSIS — R6889 Other general symptoms and signs: Secondary | ICD-10-CM | POA: Diagnosis not present

## 2022-08-19 DIAGNOSIS — L308 Other specified dermatitis: Secondary | ICD-10-CM

## 2022-08-19 LAB — POCT INFLUENZA A/B
Influenza A, POC: NEGATIVE
Influenza B, POC: NEGATIVE

## 2022-08-19 LAB — POC COVID19 BINAXNOW: SARS Coronavirus 2 Ag: NEGATIVE

## 2022-08-19 LAB — POCT RAPID STREP A (OFFICE): Rapid Strep A Screen: NEGATIVE

## 2022-08-19 MED ORDER — TRIAMCINOLONE ACETONIDE 0.1 % EX CREA: 1.0000 | TOPICAL_CREAM | Freq: Two times a day (BID) | CUTANEOUS | 0 refills | Status: AC

## 2022-08-19 NOTE — Progress Notes (Signed)
Patient ID: Sherry Marquez, female    DOB: [birth year], 52 y.o.   MRN: [id number]  Chief Complaint  Patient presents with   Sore Throat    Pt c/o sore throat, headache and fever of 100.4. Symptoms started last night.     HPI:    Sore throat:    reports sx starting yesterday with sore throat, swollen lymph node, ear pain, fever up to 100- 101. Denies cough or body aches, no fatigue. Reports not getting a flu shot this year.      Assessment & Plan:  1. Sore throat - rapid testing neg. Advised pt to take Ibuprofen 600mg  tid prn for sore throat pain, swelling, and fever. Gargle with warm salt water several tid. OK to use OTC Chloraseptic spray and/or throat lozenges prn. Drink plenty of water.   - POCT rapid strep A - POCT Influenza A/B - POC COVID-19  2. Other eczema - sending steroid cream, advised on use & SE, apply thin layer and cover with thick cream, generic Eucerin, Cetaphil, CeraVe, etc.  - triamcinolone cream (KENALOG) 0.1 %; Apply 1 Application topically 2 (two) times daily. Apply thin layer, cover with a thick hypoallergenic or non fragrant cream.  Dispense: 30 g; Refill: 0  3. Flu-like symptoms - rapid testing neg. - advised on taking Ibuprofen or generic Aleve tid for fever, body aches, sore throat.  - POCT Influenza A/B - POC COVID-19  Subjective:    Outpatient Medications Prior to Visit  Medication Sig Dispense Refill   buPROPion (WELLBUTRIN SR) 200 MG 12 hr tablet Take 1 tablet (200 mg total) by mouth 2 (two) times daily. 60 tablet 0   cetirizine (ZYRTEC) 10 MG tablet Take 10 mg by mouth daily.     ibuprofen (ADVIL) 400 MG tablet Take 400 mg by mouth every 6 (six) hours as needed.     POTASSIUM CHLORIDE PO Take 640 mg by mouth.     Vitamin D, Ergocalciferol, (DRISDOL) 1.25 MG (50000 UNIT) CAPS capsule Take 1 capsule (50,000 Units total) by mouth every 7 (seven) days. 4 capsule 0   No facility-administered medications prior to visit.   Past Medical History:   Diagnosis Date   Allergy    Anxiety    Back pain    COVID    Diabetes ([place])    Gestational   Eczema    Environmental and seasonal allergies    High cholesterol    History of gestational diabetes    Hx of blood clots    Joint pain    Lack of energy    LUMBAR SPRAIN AND STRAIN 12/05/2009   Qualifier: Diagnosis of  By: Sarajane Jews MD, Ishmael Holter    Obesity    Osteoarthritis    Pneumonia    Right ovarian cyst    Rosacea    SOB (shortness of breath)    Past Surgical History:  Procedure Laterality Date   CESAREAN SECTION  03-15-2005   dr Phineas Real  @[facility]    LAPAROSCOPY N/A 05/20/2019   Procedure: LAPAROSCOPY DIAGNOSTIC;  Surgeon: Anastasio Auerbach, MD;  Location: [facility];  Service: Gynecology;  Laterality: N/A;   WISDOM TOOTH EXTRACTION  2001   Allergies  Allergen Reactions   Cefotan [Cefotetan] Rash    Developed rash during IV infusion- dose completed -benadryl given, no other issues occured      Objective:    Physical Exam Vitals and nursing note reviewed.  Constitutional:      Appearance: Normal appearance.  She is ill-appearing.     Interventions: Face mask in place.  HENT:     Right Ear: Tympanic membrane and ear canal normal.     Left Ear: Tympanic membrane and ear canal normal.     Nose:     Right Sinus: No frontal sinus tenderness.     Left Sinus: No frontal sinus tenderness.     Mouth/Throat:     Mouth: Mucous membranes are moist.     Pharynx: Posterior oropharyngeal erythema present. No pharyngeal swelling, oropharyngeal exudate or uvula swelling.     Tonsils: No tonsillar exudate or tonsillar abscesses.  Cardiovascular:     Rate and Rhythm: Normal rate and regular rhythm.  Pulmonary:     Effort: Pulmonary effort is normal.     Breath sounds: Normal breath sounds.  Musculoskeletal:        General: Normal range of motion.  Lymphadenopathy:     Head:     Right side of head: No preauricular or posterior auricular adenopathy.     Left side of  head: No preauricular or posterior auricular adenopathy.     Cervical: Cervical adenopathy present.     Right cervical: No superficial cervical adenopathy.    Left cervical: Superficial cervical adenopathy present.  Skin:    General: Skin is warm and dry.  Neurological:     Mental Status: She is alert.  Psychiatric:        Mood and Affect: Mood normal.        Behavior: Behavior normal.    BP 132/85 (BP Location: Left Arm, Patient Position: Sitting, Cuff Size: Large)   Pulse 78   Temp (!) 100.4 F (38 C) (Temporal)   Ht 5\' 6"  (1.676 m)   Wt 220 lb 8 oz (100 kg)   SpO2 98%   BMI 35.59 kg/m  Wt Readings from Last 3 Encounters:  08/19/22 220 lb 8 oz (100 kg)  08/09/22 216 lb (98 kg)  07/06/22 215 lb (97.5 kg)      Jeanie Sewer, NP

## 2022-08-22 NOTE — Progress Notes (Unsigned)
Chief Complaint:   OBESITY Sherry Marquez is here to discuss her progress with her obesity treatment plan along with follow-up of her obesity related diagnoses. Sherry Marquez is on the Category 3 Plan and states she is following her eating plan approximately 60% of the time. Sherry Marquez states she is walking for 20 minutes 5 times per week.  Today's visit was #: 22 Starting weight: 232 lbs Starting date: 04/28/2021 Today's weight: 216 lbs Today's date: 08/09/2022 Total lbs lost to date: 16 Total lbs lost since last in-office visit: 0  Interim History: Sherry Marquez has done well with maintaining her weight over the holidays, and she is ready to get back to a structured plan.  She is increasing her walking for exercise and she is considering starting additional strengthening exercise.  Subjective:   1. Elevated blood pressure reading without diagnosis of hypertension Sherry Marquez has a new diagnosis of elevated blood pressure.  Her blood pressure is elevated today, and she denies chest pain or other pain.  Her blood pressure is normally well-controlled.  2. Vitamin D deficiency Sherry Marquez is stable on vitamin D with no side effects noted.  She is still spending time outside this winter.  3. Other depression, with emotional eating behaviors Sherry Marquez has not been concentrating on decreasing emotional eating behaviors as much over the holidays, but she plans to start back now.  She is uncertain of how much the Wellbutrin is helping but she is willing to give it another month.  Assessment/Plan:   1. Elevated blood pressure reading without diagnosis of hypertension Sherry Marquez will continue to work on her diet and exercise, and we will recheck her blood pressure at her next visit in 4 weeks.  2. Vitamin D deficiency We will refill prescription vitamin D for 1 month, and we will recheck labs in 4 weeks.  - Vitamin D, Ergocalciferol, (DRISDOL) 1.25 MG (50000 UNIT) CAPS capsule; Take 1 capsule (50,000 Units total) by mouth every 7  (seven) days.  Dispense: 4 capsule; Refill: 0  3. Other depression, with emotional eating behaviors Sherry Marquez will continue Wellbutrin SR, and we will refill for 1 month.  - buPROPion (WELLBUTRIN SR) 200 MG 12 hr tablet; Take 1 tablet (200 mg total) by mouth 2 (two) times daily.  Dispense: 60 tablet; Refill: 0  4. Obesity, Current BMI 34.9 Sherry Marquez is currently in the action stage of change. As such, her goal is to continue with weight loss efforts. She has agreed to the Category 3 Plan.   Exercise goals: As is.   Behavioral modification strategies: increasing lean protein intake and no skipping meals.  Sherry Marquez has agreed to follow-up with our clinic in 4 weeks. She was informed of the importance of frequent follow-up visits to maximize her success with intensive lifestyle modifications for her multiple health conditions.   Objective:   Blood pressure (!) 159/92, pulse 63, temperature 98.4 F (36.9 C), height 5\' 6"  (1.676 m), weight 216 lb (98 kg), SpO2 97 %. Body mass index is 34.86 kg/m.  General: Cooperative, alert, well developed, in no acute distress. HEENT: Conjunctivae and lids unremarkable. Cardiovascular: Regular rhythm.  Lungs: Normal work of breathing. Neurologic: No focal deficits.   Lab Results  Component Value Date   CREATININE 0.86 02/28/2022   BUN 23 02/28/2022   NA 141 02/28/2022   K 4.7 02/28/2022   CL 102 02/28/2022   CO2 22 02/28/2022   Lab Results  Component Value Date   ALT 18 02/28/2022   AST 18 02/28/2022  ALKPHOS 93 02/28/2022   BILITOT 0.3 02/28/2022   Lab Results  Component Value Date   HGBA1C 5.5 09/20/2021   HGBA1C 5.7 02/24/2021   HGBA1C 5.7 10/29/2013   Lab Results  Component Value Date   INSULIN 9.4 09/20/2021   INSULIN 12.2 04/28/2021   Lab Results  Component Value Date   TSH 2.340 04/28/2021   Lab Results  Component Value Date   CHOL 233 (H) 02/28/2022   HDL 56 02/28/2022   LDLCALC 160 (H) 02/28/2022   TRIG 94 02/28/2022    CHOLHDL 4 02/24/2021   Lab Results  Component Value Date   VD25OH 30.6 02/28/2022   VD25OH 42.9 09/20/2021   VD25OH 22.1 (L) 04/28/2021   Lab Results  Component Value Date   WBC 4.6 02/24/2021   HGB 15.0 02/24/2021   HCT 44.0 02/24/2021   MCV 89.8 02/24/2021   PLT 241.0 02/24/2021   Lab Results  Component Value Date   FERRITIN 58 09/05/2020   Attestation Statements:   Reviewed by clinician on day of visit: allergies, medications, problem list, medical history, surgical history, family history, social history, and previous encounter notes.   I, Trixie Dredge, am acting as transcriptionist for Dennard Nip, MD.  I have reviewed the above documentation for accuracy and completeness, and I agree with the above. -  Dennard Nip, MD

## 2022-08-29 ENCOUNTER — Ambulatory Visit (INDEPENDENT_AMBULATORY_CARE_PROVIDER_SITE_OTHER): Payer: 59 | Admitting: Psychology

## 2022-08-29 DIAGNOSIS — F4321 Adjustment disorder with depressed mood: Secondary | ICD-10-CM

## 2022-08-29 NOTE — Progress Notes (Signed)
[facility] Counselor/Therapist Progress Note  Patient ID: Sherry Marquez, MRN: [id number],    Date: 08/29/2022  Time Spent: 10:00am - 10:55am   55 minutes   Treatment Type: Individual Therapy  Reported Symptoms: stress  Mental Status Exam: Appearance:  Casual     Behavior: Appropriate  Motor: Normal  Speech/Language:  Normal Rate  Affect: Appropriate  Mood: normal  Thought process: normal  Thought content:   WNL  Sensory/Perceptual disturbances:   WNL  Orientation: oriented to person, place, time/date, and situation  Attention: Good  Concentration: Good  Memory: WNL  Fund of knowledge:  Good  Insight:   Good  Judgment:  Good  Impulse Control: Good   Risk Assessment: Danger to Self:  No Self-injurious Behavior: No Danger to Others: No Duty to Warn:no Physical Aggression / Violence:No  Access to Firearms a concern: No  Gang Involvement:No   Subjective: Pt present for face-to-face individual therapy via video Webex.  Pt consents to telehealth video session due to COVID 19 pandemic. Location of pt: home Location of therapist: home office.   Pt talked about being sick the past couple of weeks with a virus.   She is feelings better now.   Pt still have not been able to celebrate Christmas with her parents bc of illness in the family.   She hopes to be able to see her parents this weekend.   Pt continues to work on her weight loss goals but has not been doing as well since she was sick.   She is working on getting back on track.   Pt talked about feeling frustrated with her husband.  Helped pt process her feelings.  She has resentments toward her husband.  Addressed how pt can have some conversations with her husband about her feelings.  Helped pt process her feelings and relationship dynamics.   Encouraged pt to continue to journal.    Worked on self care strategies.  Provided supportive therapy.  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  Treatment Plan(treatment plan target date 06/29/2023) Client Abilities/Strengths  Pt is bright, engaging, and motivated for therapy.   Client Treatment Preferences  Individual therapy.  Client Statement of Needs  Improve coping skills.  Symptoms  Depressed or irritable mood. Low self-esteem. Unresolved grief issues.   Problems Addressed  Unipolar Depression Goals 1. Alleviate depressive symptoms and return to previous level of effective functioning. 2. Appropriately grieve the loss in order to normalize mood and to return to previously adaptive level of functioning. Objective Learn and implement behavioral strategies to overcome depression. Target Date: 2023-06-29 Frequency: Biweekly  Progress: 40 Modality: individual  Related Interventions Engage the client in "behavioral activation," increasing his/her activity level and contact with sources of reward, while identifying processes that inhibit activation.  Use behavioral techniques such as instruction, rehearsal, role-playing, role reversal, as needed, to facilitate activity in the client's daily life; reinforce success. Assist the client in developing skills that increase the likelihood of deriving pleasure from behavioral activation (e.g., assertiveness skills, developing an exercise plan, less internal/more external focus, increased social involvement); reinforce success. Objective Identify important people in life, past and present, and describe the quality, good and poor, of those relationships. Target Date: 2023-06-29 Frequency: Biweekly  Progress: 40 Modality: individual  Related Interventions Conduct Interpersonal Therapy beginning with the assessment of the client's "interpersonal inventory" of important past and present relationships; develop a case formulation linking depression  to grief, interpersonal role disputes, role transitions,  and/or interpersonal deficits). Objective Learn and implement problem-solving and decision-making skills. Target Date: 2023-06-29 Frequency: Biweekly  Progress: 40 Modality: individual  Related Interventions Conduct Problem-Solving Therapy using techniques such as psychoeducation, modeling, and role-playing to teach client problem-solving skills (i.e., defining a problem specifically, generating possible solutions, evaluating the pros and cons of each solution, selecting and implementing a plan of action, evaluating the efficacy of the plan, accepting or revising the plan); role-play application of the problem-solving skill to a real life issue. Encourage in the client the development of a positive problem orientation in which problems and solving them are viewed as a natural part of life and not something to be feared, despaired, or avoided. 3. Develop healthy interpersonal relationships that lead to the alleviation and help prevent the relapse of depression. 4. Develop healthy thinking patterns and beliefs about self, others, and the world that lead to the alleviation and help prevent the relapse of depression. 5. Recognize, accept, and cope with feelings of depression. Diagnosis F43.21 Conditions For Discharge Achievement of treatment goals and objectives   Clint Bolder, LCSW

## 2022-09-05 ENCOUNTER — Other Ambulatory Visit (INDEPENDENT_AMBULATORY_CARE_PROVIDER_SITE_OTHER): Payer: Self-pay | Admitting: Family Medicine

## 2022-09-05 DIAGNOSIS — F3289 Other specified depressive episodes: Secondary | ICD-10-CM

## 2022-09-07 ENCOUNTER — Ambulatory Visit (INDEPENDENT_AMBULATORY_CARE_PROVIDER_SITE_OTHER): Payer: 59 | Admitting: Family Medicine

## 2022-09-07 ENCOUNTER — Encounter: Payer: Self-pay | Admitting: Family Medicine

## 2022-09-07 VITALS — BP 138/70 | HR 93 | Temp 98.2°F | Ht 66.0 in | Wt 223.1 lb

## 2022-09-07 DIAGNOSIS — Z0000 Encounter for general adult medical examination without abnormal findings: Secondary | ICD-10-CM

## 2022-09-07 NOTE — Progress Notes (Signed)
Complete physical exam  Patient: Sherry Marquez   DOB: 03/14/1971   52 y.o. Female  MRN: [id number]  Subjective:    Chief Complaint  Patient presents with   Sherry Marquez is a 52 y.o. female who presents today for a complete physical exam. She reports consuming a  low calorie  diet. Gym/ health club routine includes cardio. She generally feels well. She reports sleeping well. She does not have additional problems to discuss today.   Patient is here for transition of care also.   Obesity-- patient has been going regularly to the weight loss center. States that initially she was very successful, had lost 30 pounds but then she started gradually gaining back. States she is also working with  her behavioral health provider who is helping her as well. We reviewed her last set of labs, her LDL was elevated and we discussed this briefly. States that she will continue to follow   Patient is reporting left ankle/ foot instability. No pain in the foot chronically, states that it is very random, sometimes when she steps down onto the foot  she feels like there is some sort of misalignment and it hurts momentarily but then resolves. State she usually wears sneakers. Nothing on the bottom of the foot, states it happens in the ankle medially.   Most recent fall risk assessment:    12/03/2021    1:16 PM  Fall Risk   Falls in the past year? 0  Number falls in past yr: 0  Injury with Fall? 0  Risk for fall due to : No Fall Risks  Follow up Falls evaluation completed     Most recent depression screenings:    12/03/2021    1:16 PM 09/01/2021   11:02 AM  PHQ 2/9 Scores  PHQ - 2 Score 0 0  PHQ- 9 Score 1 1    Dental: No current dental problems and Receives regular dental care  Patient Active Problem List   Diagnosis Date Noted   Depression 08/09/2022   Elevated blood pressure reading without diagnosis of hypertension 08/09/2022   Abnormal craving 05/23/2022    Pre-diabetes 05/23/2022   Class 2 severe obesity with serious comorbidity and body mass index (BMI) of 37.0 to 37.9 in adult ([place]) 05/23/2022   Vitamin D deficiency 01/03/2022   Other hyperlipidemia 01/03/2022   At risk for depression 01/03/2022   Vitamin D insufficiency 04/29/2021   Acute deep vein thrombosis (DVT) of proximal vein of left lower extremity ([place]) 10/05/2020   Pneumonia due to COVID-19 virus 09/02/2020   Dyslipidemia 09/02/2020   Pulmonary embolism ([place]) 09/02/2020   Acute respiratory failure with hypoxia ([place]) 09/02/2020   Class 2 obesity 09/02/2020   Rosacea    Right ovarian cyst    Hemorrhagic cyst of right ovary 01/30/2019   Abdominal pain 01/29/2019   Urinary tract infection symptoms 01/26/2019   Seasonal allergies 10/29/2013   Depressed mood 10/29/2013      Patient Care Team: Farrel Conners, MD as PCP - General (Family Medicine) Specialists, Dermatology (Dermatology) Princess Bruins, MD as Consulting Physician (Obstetrics and Gynecology)   Outpatient Medications Prior to Visit  Medication Sig   buPROPion [facility] SR) 200 MG 12 hr tablet Take 1 tablet (200 mg total) by mouth 2 (two) times daily.   cetirizine (ZYRTEC) 10 MG tablet Take 10 mg by mouth daily.   COLLAGEN PO Take by mouth. 1 scoop daily   ibuprofen (ADVIL) 400  MG tablet Take 400 mg by mouth every 6 (six) hours as needed.   MAGNESIUM PO Take by mouth daily.   POTASSIUM CHLORIDE PO Take 640 mg by mouth.   triamcinolone cream (KENALOG) 0.1 % Apply 1 Application topically 2 (two) times daily. Apply thin layer, cover with a thick hypoallergenic or non fragrant cream.   Vitamin D, Ergocalciferol, (DRISDOL) 1.25 MG (50000 UNIT) CAPS capsule Take 1 capsule (50,000 Units total) by mouth every 7 (seven) days.   No facility-administered medications prior to visit.    Review of Systems  HENT:  Negative for hearing loss.   Eyes:  Negative for blurred vision.  Respiratory:  Negative for  shortness of breath.   Cardiovascular:  Negative for chest pain.  Gastrointestinal: Negative.   Genitourinary: Negative.   Musculoskeletal:  Negative for back pain.  Neurological:  Negative for headaches.  Psychiatric/Behavioral:  Negative for depression.   All other systems reviewed and are negative.         Objective:     BP 138/70 (BP Location: Left Arm, Patient Position: Sitting, Cuff Size: Large)   Pulse 93   Temp 98.2 F (36.8 C) (Oral)   Ht 5\' 6"  (1.676 m)   Wt 223 lb 1.6 oz (101.2 kg)   SpO2 99%   BMI 36.01 kg/m    Physical Exam Vitals reviewed.  Constitutional:      Appearance: Normal appearance. She is well-groomed. She is obese.  Eyes:     Conjunctiva/sclera: Conjunctivae normal.  Neck:     Thyroid: No thyromegaly.  Cardiovascular:     Rate and Rhythm: Normal rate and regular rhythm.     Pulses: Normal pulses.     Heart sounds: S1 normal and S2 normal.  Pulmonary:     Effort: Pulmonary effort is normal.     Breath sounds: Normal breath sounds and air entry.  Abdominal:     General: Bowel sounds are normal.  Musculoskeletal:        General: No tenderness or deformity. Normal range of motion.     Right lower leg: No edema.     Left lower leg: No edema.  Neurological:     Mental Status: She is alert and oriented to person, place, and time. Mental status is at baseline.     Gait: Gait is intact.  Psychiatric:        Mood and Affect: Mood and affect normal.        Speech: Speech normal.        Behavior: Behavior normal.        Judgment: Judgment normal.      No results found for any visits on 09/07/22. Last metabolic panel Lab Results  Component Value Date   GLUCOSE 99 02/28/2022   NA 141 02/28/2022   K 4.7 02/28/2022   CL 102 02/28/2022   CO2 22 02/28/2022   BUN 23 02/28/2022   CREATININE 0.86 02/28/2022   EGFR 83 02/28/2022   CALCIUM 9.6 02/28/2022   PROT 6.7 02/28/2022   ALBUMIN 4.3 02/28/2022   LABGLOB 2.4 02/28/2022   AGRATIO 1.8  02/28/2022   BILITOT 0.3 02/28/2022   ALKPHOS 93 02/28/2022   AST 18 02/28/2022   ALT 18 02/28/2022   ANIONGAP 12 09/02/2020   Last lipids Lab Results  Component Value Date   CHOL 233 (H) 02/28/2022   HDL 56 02/28/2022   LDLCALC 160 (H) 02/28/2022   TRIG 94 02/28/2022   CHOLHDL 4 02/24/2021  Assessment & Plan:    Routine Health Maintenance and Physical Exam  Immunization History  Administered Date(s) Administered   Influenza,inj,Quad PF,6+ Mos 05/21/2018   Influenza-Unspecified 06/22/2014, 05/09/2015, 05/21/2018    Health Maintenance  Topic Date Due   DTaP/Tdap/Td (1 - Tdap) Never done   COVID-19 Vaccine (1) 09/23/2022 (Originally 12/31/1971)   INFLUENZA VACCINE  11/07/2022 (Originally 03/08/2022)   Hepatitis C Screening  09/08/2023 (Originally 07[birth year]/1990)   Zoster Vaccines- Shingrix (1 of 2) 09/08/2023 (Originally 07/03/2022)   MAMMOGRAM  10/15/2022   Fecal DNA (Cologuard)  09/22/2024   PAP SMEAR-Modifier  10/25/2024   HIV Screening  Completed   HPV VACCINES  Aged Out    Discussed health benefits of physical activity, and encouraged her to engage in regular exercise appropriate for her age and condition.  Problem List Items Addressed This Visit   None Visit Diagnoses     Healthy adult on routine physical examination    -  Primary     Normal Physical exam findings today. We reviewed her HM and she is UTD on mammogram, colonoscopy, and pap smear. Pt declines any vaccines today. I advised her to monitor her left ankle symptoms and if they worsen or persist to contact the office for further evaluation. Previous labs ordered by the weight loss center were reviewed with patient and we discussed her elevated LDL, 10 year CVD risk score is <5% so medication is not indicated at this time. Advised she continue with regular exercise and dietary changes.   Return in about 1 year (around 09/08/2023) for annual physical exam.     Farrel Conners, MD

## 2022-09-12 ENCOUNTER — Ambulatory Visit (INDEPENDENT_AMBULATORY_CARE_PROVIDER_SITE_OTHER): Payer: 59 | Admitting: Psychology

## 2022-09-12 DIAGNOSIS — F4321 Adjustment disorder with depressed mood: Secondary | ICD-10-CM

## 2022-09-12 NOTE — Progress Notes (Signed)
[facility] Counselor/Therapist Progress Note  Patient ID: Sherry Marquez, MRN: [id number],    Date: 09/12/2022  Time Spent: 10:00am - 10:55am   55 minutes   Treatment Type: Individual Therapy  Reported Symptoms: stress  Mental Status Exam: Appearance:  Casual     Behavior: Appropriate  Motor: Normal  Speech/Language:  Normal Rate  Affect: Appropriate  Mood: normal  Thought process: normal  Thought content:   WNL  Sensory/Perceptual disturbances:   WNL  Orientation: oriented to person, place, time/date, and situation  Attention: Good  Concentration: Good  Memory: WNL  Fund of knowledge:  Good  Insight:   Good  Judgment:  Good  Impulse Control: Good   Risk Assessment: Danger to Self:  No Self-injurious Behavior: No Danger to Others: No Duty to Warn:no Physical Aggression / Violence:No  Access to Firearms a concern: No  Gang Involvement:No   Subjective: Pt present for face-to-face individual therapy via video Webex.  Pt consents to telehealth video session due to COVID 19 pandemic. Location of pt: home Location of therapist: home office.   Pt talked about visiting her parents which was a good visit.   Pt talked about challenges with her dog.  Addressed the behavioral issues and helped pt problem solve.  Pt talked about the stress of educational decisions for her sons.   Pt and her parents have disagreements about education.   Pt does not want to push her kids too much.   Helped pt process her feelings and relationship dynamics.   Pt talked about getting a new PCP at [facility].   It was recommended that she try to new weight loss drugs but pt does not want to do that.  Addressed pt's concerns.     Worked on self care strategies.  Provided supportive therapy.  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  Treatment Plan(treatment plan target date 06/29/2023) Client  Abilities/Strengths  Pt is bright, engaging, and motivated for therapy.   Client Treatment Preferences  Individual therapy.  Client Statement of Needs  Improve coping skills.  Symptoms  Depressed or irritable mood. Low self-esteem. Unresolved grief issues.   Problems Addressed  Unipolar Depression Goals 1. Alleviate depressive symptoms and return to previous level of effective functioning. 2. Appropriately grieve the loss in order to normalize mood and to return to previously adaptive level of functioning. Objective Learn and implement behavioral strategies to overcome depression. Target Date: 2023-06-29 Frequency: Biweekly  Progress: 40 Modality: individual  Related Interventions Engage the client in "behavioral activation," increasing his/her activity level and contact with sources of reward, while identifying processes that inhibit activation.  Use behavioral techniques such as instruction, rehearsal, role-playing, role reversal, as needed, to facilitate activity in the client's daily life; reinforce success. Assist the client in developing skills that increase the likelihood of deriving pleasure from behavioral activation (e.g., assertiveness skills, developing an exercise plan, less internal/more external focus, increased social involvement); reinforce success. Objective Identify important people in life, past and present, and describe the quality, good and poor, of those relationships. Target Date: 2023-06-29 Frequency: Biweekly  Progress: 40 Modality: individual  Related Interventions Conduct Interpersonal Therapy beginning with the assessment of the client's "interpersonal inventory" of important past and present relationships; develop a case formulation linking depression to grief, interpersonal role disputes, role transitions, and/or interpersonal deficits). Objective Learn and implement problem-solving and decision-making skills. Target Date: 2023-06-29 Frequency: Biweekly   Progress: 40 Modality:  individual  Related Interventions Conduct Problem-Solving Therapy using techniques such as psychoeducation, modeling, and role-playing to teach client problem-solving skills (i.e., defining a problem specifically, generating possible solutions, evaluating the pros and cons of each solution, selecting and implementing a plan of action, evaluating the efficacy of the plan, accepting or revising the plan); role-play application of the problem-solving skill to a real life issue. Encourage in the client the development of a positive problem orientation in which problems and solving them are viewed as a natural part of life and not something to be feared, despaired, or avoided. 3. Develop healthy interpersonal relationships that lead to the alleviation and help prevent the relapse of depression. 4. Develop healthy thinking patterns and beliefs about self, others, and the world that lead to the alleviation and help prevent the relapse of depression. 5. Recognize, accept, and cope with feelings of depression. Diagnosis F43.21 Conditions For Discharge Achievement of treatment goals and objectives   Clint Bolder, LCSW

## 2022-09-13 ENCOUNTER — Encounter (INDEPENDENT_AMBULATORY_CARE_PROVIDER_SITE_OTHER): Payer: Self-pay | Admitting: Family Medicine

## 2022-09-13 ENCOUNTER — Ambulatory Visit (INDEPENDENT_AMBULATORY_CARE_PROVIDER_SITE_OTHER): Payer: 59 | Admitting: Family Medicine

## 2022-09-13 VITALS — BP 139/79 | HR 68 | Temp 97.6°F | Ht 66.0 in | Wt 218.0 lb

## 2022-09-13 DIAGNOSIS — R7303 Prediabetes: Secondary | ICD-10-CM

## 2022-09-13 DIAGNOSIS — E559 Vitamin D deficiency, unspecified: Secondary | ICD-10-CM | POA: Diagnosis not present

## 2022-09-13 DIAGNOSIS — E669 Obesity, unspecified: Secondary | ICD-10-CM

## 2022-09-13 DIAGNOSIS — Z6834 Body mass index (BMI) 34.0-34.9, adult: Secondary | ICD-10-CM | POA: Insufficient documentation

## 2022-09-13 DIAGNOSIS — Z6835 Body mass index (BMI) 35.0-35.9, adult: Secondary | ICD-10-CM

## 2022-09-13 DIAGNOSIS — F3289 Other specified depressive episodes: Secondary | ICD-10-CM | POA: Diagnosis not present

## 2022-09-13 DIAGNOSIS — E7849 Other hyperlipidemia: Secondary | ICD-10-CM

## 2022-09-13 MED ORDER — VITAMIN D (ERGOCALCIFEROL) 1.25 MG (50000 UNIT) PO CAPS: 50000.0000 [IU] | ORAL_CAPSULE | ORAL | 0 refills | Status: DC

## 2022-09-13 MED ORDER — BUPROPION HCL ER (SR) 200 MG PO TB12: 200.0000 mg | ORAL_TABLET | Freq: Two times a day (BID) | ORAL | 0 refills | Status: DC

## 2022-09-14 LAB — CMP14+EGFR
ALT: 19 IU/L (ref 0–32)
AST: 19 IU/L (ref 0–40)
Albumin/Globulin Ratio: 1.9 (ref 1.2–2.2)
Albumin: 4.3 g/dL (ref 3.9–4.9)
Alkaline Phosphatase: 113 IU/L (ref 44–121)
BUN/Creatinine Ratio: 24 — ABNORMAL HIGH (ref 9–23)
BUN: 21 mg/dL (ref 6–24)
Bilirubin Total: 0.3 mg/dL (ref 0.0–1.2)
CO2: 25 mmol/L (ref 20–29)
Calcium: 9.4 mg/dL (ref 8.7–10.2)
Chloride: 102 mmol/L (ref 96–106)
Creatinine, Ser: 0.88 mg/dL (ref 0.57–1.00)
Globulin, Total: 2.3 g/dL (ref 1.5–4.5)
Glucose: 93 mg/dL (ref 70–99)
Potassium: 4.6 mmol/L (ref 3.5–5.2)
Sodium: 141 mmol/L (ref 134–144)
Total Protein: 6.6 g/dL (ref 6.0–8.5)
eGFR: 80 mL/min/{1.73_m2} (ref >59)

## 2022-09-14 LAB — HEMOGLOBIN A1C
Est. average glucose Bld gHb Est-mCnc: 126 mg/dL
Hgb A1c MFr Bld: 6 % — ABNORMAL HIGH (ref 4.8–5.6)

## 2022-09-14 LAB — VITAMIN B12: Vitamin B-12: 1002 pg/mL (ref 232–1245)

## 2022-09-14 LAB — LIPID PANEL WITH LDL/HDL RATIO
Cholesterol, Total: 249 mg/dL — ABNORMAL HIGH (ref 100–199)
HDL: 56 mg/dL (ref >39)
LDL Chol Calc (NIH): 182 mg/dL — ABNORMAL HIGH (ref 0–99)
LDL/HDL Ratio: 3.3 ratio — ABNORMAL HIGH (ref 0.0–3.2)
Triglycerides: 65 mg/dL (ref 0–149)
VLDL Cholesterol Cal: 11 mg/dL (ref 5–40)

## 2022-09-14 LAB — VITAMIN D 25 HYDROXY (VIT D DEFICIENCY, FRACTURES): Vit D, 25-Hydroxy: 32.9 ng/mL (ref 30.0–100.0)

## 2022-09-14 LAB — TSH: TSH: 2.44 u[IU]/mL (ref 0.450–4.500)

## 2022-09-14 LAB — INSULIN, RANDOM: INSULIN: 10.1 u[IU]/mL (ref 2.6–24.9)

## 2022-09-27 NOTE — Progress Notes (Unsigned)
Chief Complaint:   OBESITY Sherry Marquez is here to discuss her progress with her obesity treatment plan along with follow-up of her obesity related diagnoses. Sherry Marquez is on the Category 3 Plan and states she is following her eating plan approximately 70% of the time. Sherry Marquez states she is walking 10,000 steps 6 times per week.    Today's visit was #: 23 Starting weight: 232 lbs Starting date: 04/28/2021 Today's weight: 218 lbs Today's date: 09/13/2022 Total lbs lost to date: 14 Total lbs lost since last in-office visit: 0  Interim History: Sherry Marquez has been off track with her eating recently while sick. She is working on getting back on track but still struggles with cravings. She has increased her exercise.   Subjective:   1. Pre-diabetes Sherry Marquez is working on her diet and weight loss. She is due to have labs rechecked. She notes some polyphagia but  she wants  to avoid GLP-1 drugs.   2. Vitamin D deficiency Sherry Marquez is on Vitamin D, and she is tolerating it well with no nausea or vomiting.   3. Other hyperlipidemia Sherry Marquez is not on statin, and she is attempting to improve with diet.   4. Emotional Eating Behavior Sherry Marquez is stable on Wellbutrin, but she is not sure how much it is helping. She would like to wean off of this in the next month.   Assessment/Plan:   1. Pre-diabetes We will check labs today. Sherry Marquez will continue to work on weight loss, exercise, and decreasing simple carbohydrates to help decrease the risk of diabetes.   - CMP14+EGFR - Insulin, random - Hemoglobin A1c - Vitamin B12  2. Vitamin D deficiency We will check labs today, and we will refill prescription Vitamin D for 1 month.   - Vitamin D, Ergocalciferol, (DRISDOL) 1.25 MG (50000 UNIT) CAPS capsule; Take 1 capsule (50,000 Units total) by mouth every 7 (seven) days.  Dispense: 4 capsule; Refill: 0 - VITAMIN D 25 Hydroxy (Vit-D Deficiency, Fractures)  3. Other hyperlipidemia We will check labs today. Cardiovascular  risk and specific lipid/LDL goals reviewed.  We discussed several lifestyle modifications today and Sherry Marquez will continue to work on diet, exercise and weight loss efforts. Orders and follow up as documented in patient record.   - Lipid Panel With LDL/HDL Ratio - TSH  4. Emotional Eating Behavior Sherry Marquez will continue Wellbutrin SR, and we will refill for 1 month.   - buPROPion (WELLBUTRIN SR) 200 MG 12 hr tablet; Take 1 tablet (200 mg total) by mouth 2 (two) times daily.  Dispense: 60 tablet; Refill: 0  5. BMI 35.0-35.9,adult  6. Beginning BMI 37.45 Sherry Marquez is currently in the action stage of change. As such, her goal is to continue with weight loss efforts. She has agreed to the Category 3 Plan.   Exercise goals: As is.   Behavioral modification strategies: increasing lean protein intake and no skipping meals.  Sherry Marquez has agreed to follow-up with our clinic in 4 weeks. She was informed of the importance of frequent follow-up visits to maximize her success with intensive lifestyle modifications for her multiple health conditions.   Sherry Marquez was informed we would discuss her lab results at her next visit unless there is a critical issue that needs to be addressed sooner. Sherry Marquez agreed to keep her next visit at the agreed upon time to discuss these results.  Objective:   Blood pressure 139/79, pulse 68, temperature 97.6 F (36.4 C), height 5' 6"$  (1.676 m), weight 218 lb (98.9 kg),  SpO2 98 %. Body mass index is 35.19 kg/m.  General: Cooperative, alert, well developed, in no acute distress. HEENT: Conjunctivae and lids unremarkable. Cardiovascular: Regular rhythm.  Lungs: Normal work of breathing. Neurologic: No focal deficits.   Lab Results  Component Value Date   CREATININE 0.88 09/13/2022   BUN 21 09/13/2022   NA 141 09/13/2022   K 4.6 09/13/2022   CL 102 09/13/2022   CO2 25 09/13/2022   Lab Results  Component Value Date   ALT 19 09/13/2022   AST 19 09/13/2022   ALKPHOS 113  09/13/2022   BILITOT 0.3 09/13/2022   Lab Results  Component Value Date   HGBA1C 6.0 (H) 09/13/2022   HGBA1C 5.5 09/20/2021   HGBA1C 5.7 02/24/2021   HGBA1C 5.7 10/29/2013   Lab Results  Component Value Date   INSULIN 10.1 09/13/2022   INSULIN 9.4 09/20/2021   INSULIN 12.2 04/28/2021   Lab Results  Component Value Date   TSH 2.440 09/13/2022   Lab Results  Component Value Date   CHOL 249 (H) 09/13/2022   HDL 56 09/13/2022   LDLCALC 182 (H) 09/13/2022   TRIG 65 09/13/2022   CHOLHDL 4 02/24/2021   Lab Results  Component Value Date   VD25OH 32.9 09/13/2022   VD25OH 30.6 02/28/2022   VD25OH 42.9 09/20/2021   Lab Results  Component Value Date   WBC 4.6 02/24/2021   HGB 15.0 02/24/2021   HCT 44.0 02/24/2021   MCV 89.8 02/24/2021   PLT 241.0 02/24/2021   Lab Results  Component Value Date   FERRITIN 58 09/05/2020   Attestation Statements:   Reviewed by clinician on day of visit: allergies, medications, problem list, medical history, surgical history, family history, social history, and previous encounter notes.   I, Trixie Dredge, am acting as transcriptionist for Dennard Nip, MD.  I have reviewed the above documentation for accuracy and completeness, and I agree with the above. -  Dennard Nip, MD

## 2022-09-29 ENCOUNTER — Ambulatory Visit (INDEPENDENT_AMBULATORY_CARE_PROVIDER_SITE_OTHER): Payer: 59 | Admitting: Psychology

## 2022-09-29 DIAGNOSIS — F4321 Adjustment disorder with depressed mood: Secondary | ICD-10-CM | POA: Diagnosis not present

## 2022-09-29 NOTE — Progress Notes (Signed)
[facility] Counselor/Therapist Progress Note  Patient ID: Sherry Marquez, MRN: [id number],    Date: 09/29/2022  Time Spent: 10:00am - 10:55am   55 minutes   Treatment Type: Individual Therapy  Reported Symptoms: stress  Mental Status Exam: Appearance:  Casual     Behavior: Appropriate  Motor: Normal  Speech/Language:  Normal Rate  Affect: Appropriate  Mood: normal  Thought process: normal  Thought content:   WNL  Sensory/Perceptual disturbances:   WNL  Orientation: oriented to person, place, time/date, and situation  Attention: Good  Concentration: Good  Memory: WNL  Fund of knowledge:  Good  Insight:   Good  Judgment:  Good  Impulse Control: Good   Risk Assessment: Danger to Self:  No Self-injurious Behavior: No Danger to Others: No Duty to Warn:no Physical Aggression / Violence:No  Access to Firearms a concern: No  Gang Involvement:No   Subjective: Pt present for face-to-face individual therapy via video Webex.  Pt consents to telehealth video session due to COVID 19 pandemic. Location of pt: home Location of therapist: home office.   Pt talked about her sons and their decisions for high school programs.  Pt's older son is a Equities trader and does not want to go to graduation which is disappointing for pt.  Addressed pt's thoughts and feelings.   Pt talked about her weight.  She saw her doctor who wants her to stay on the Welbutrin for another month.  Pt has been doing better with her weight loss plan.   Pt is worried about her husband.  She feels like he is bottling a lot of emotions.  He is in therapy.  Addressed how pt can support and encourage her husband to address various issues in therapy.    Pt states when her husband is feeling down and struggling pt feels more needed and likes feeling needed.  Addressed how pt can get her need for being needed met in additional ways.   Worked on self care strategies.  Provided supportive therapy.  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  Treatment Plan(treatment plan target date 06/29/2023) Client Abilities/Strengths  Pt is bright, engaging, and motivated for therapy.   Client Treatment Preferences  Individual therapy.  Client Statement of Needs  Improve coping skills.  Symptoms  Depressed or irritable mood. Low self-esteem. Unresolved grief issues.   Problems Addressed  Unipolar Depression Goals 1. Alleviate depressive symptoms and return to previous level of effective functioning. 2. Appropriately grieve the loss in order to normalize mood and to return to previously adaptive level of functioning. Objective Learn and implement behavioral strategies to overcome depression. Target Date: 2023-06-29 Frequency: Biweekly  Progress: 40 Modality: individual  Related Interventions Engage the client in "behavioral activation," increasing his/her activity level and contact with sources of reward, while identifying processes that inhibit activation.  Use behavioral techniques such as instruction, rehearsal, role-playing, role reversal, as needed, to facilitate activity in the client's daily life; reinforce success. Assist the client in developing skills that increase the likelihood of deriving pleasure from behavioral activation (e.g., assertiveness skills, developing an exercise plan, less internal/more external focus, increased social involvement); reinforce success. Objective Identify important people in life, past and present, and describe the quality, good and poor, of those relationships. Target Date: 2023-06-29 Frequency: Biweekly  Progress: 40 Modality: individual  Related Interventions Conduct Interpersonal Therapy beginning with the assessment of the client's "interpersonal inventory" of important past and  present relationships; develop a case formulation linking depression to grief, interpersonal role  disputes, role transitions, and/or interpersonal deficits). Objective Learn and implement problem-solving and decision-making skills. Target Date: 2023-06-29 Frequency: Biweekly  Progress: 40 Modality: individual  Related Interventions Conduct Problem-Solving Therapy using techniques such as psychoeducation, modeling, and role-playing to teach client problem-solving skills (i.e., defining a problem specifically, generating possible solutions, evaluating the pros and cons of each solution, selecting and implementing a plan of action, evaluating the efficacy of the plan, accepting or revising the plan); role-play application of the problem-solving skill to a real life issue. Encourage in the client the development of a positive problem orientation in which problems and solving them are viewed as a natural part of life and not something to be feared, despaired, or avoided. 3. Develop healthy interpersonal relationships that lead to the alleviation and help prevent the relapse of depression. 4. Develop healthy thinking patterns and beliefs about self, others, and the world that lead to the alleviation and help prevent the relapse of depression. 5. Recognize, accept, and cope with feelings of depression. Diagnosis F43.21 Conditions For Discharge Achievement of treatment goals and objectives   Clint Bolder, LCSW

## 2022-10-04 IMAGING — CT CT ANGIO CHEST
2 of 6 series · 18 of 36 positions shown · IV contrast (omnipaque)
Comparison: 09/02/2020 radiographs

CLINICAL DATA: 49-year-old female with hypoxia and elevated
D-dimer.

EXAM:
CT ANGIOGRAPHY CHEST WITH CONTRAST
TECHNIQUE: Multidetector CT imaging of the chest was performed using the
standard protocol during bolus administration of intravenous
contrast. Multiplanar CT image reconstructions and MIPs were
obtained to evaluate the vascular anatomy.
CONTRAST:  75mL OMNIPAQUE IOHEXOL 350 MG/ML SOLN

[Series 5: thins · axial · 0.69mm/px · z∈[+1290,+1554]mm · 17 of 297 slices shown]
[im 17/297  lung]
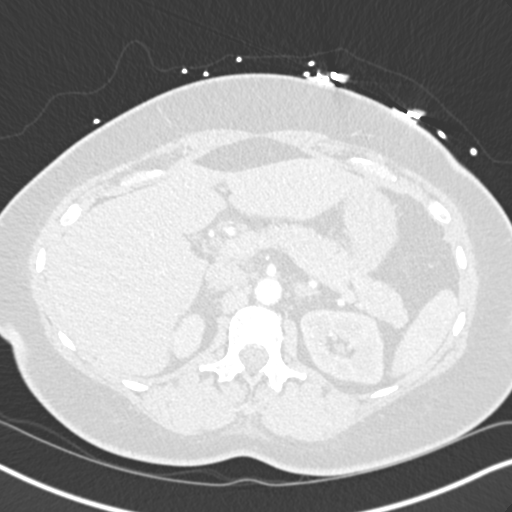
[im 33/297  mediastinal]
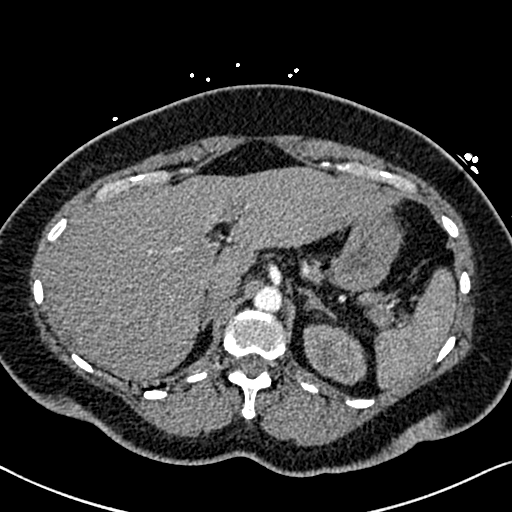
[im 50/297  lung]
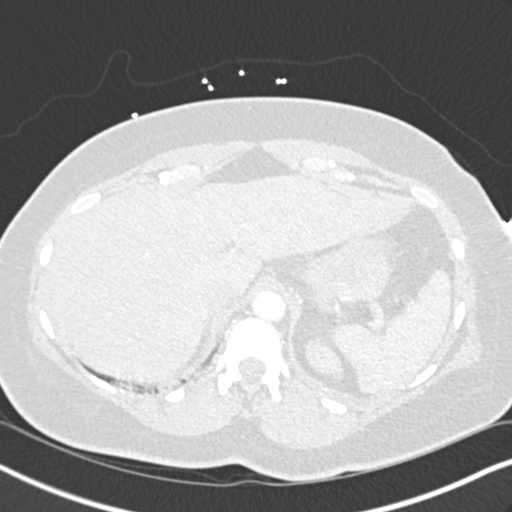
[im 66/297  mediastinal]
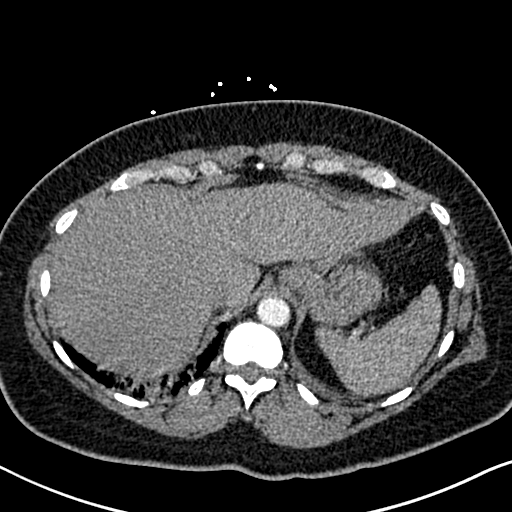
[im 83/297  lung]
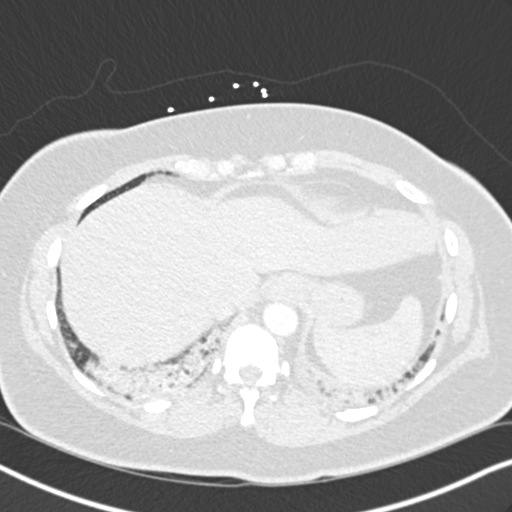
[im 99/297  mediastinal]
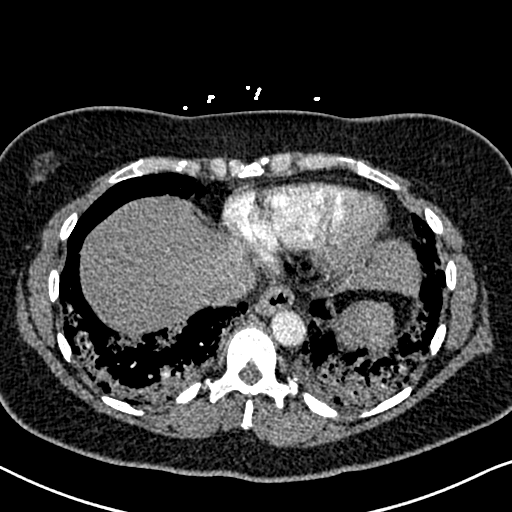
[im 116/297  lung]
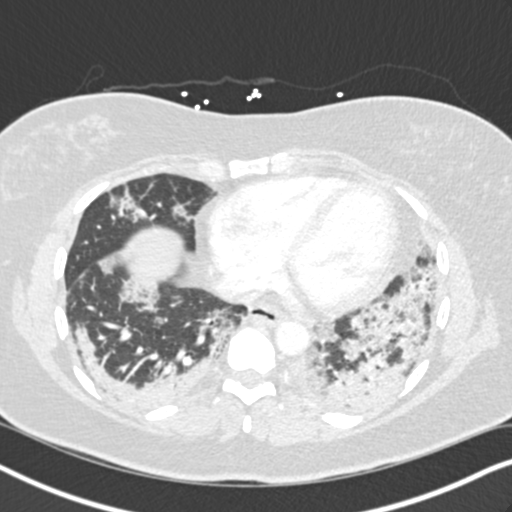
[im 132/297  mediastinal]
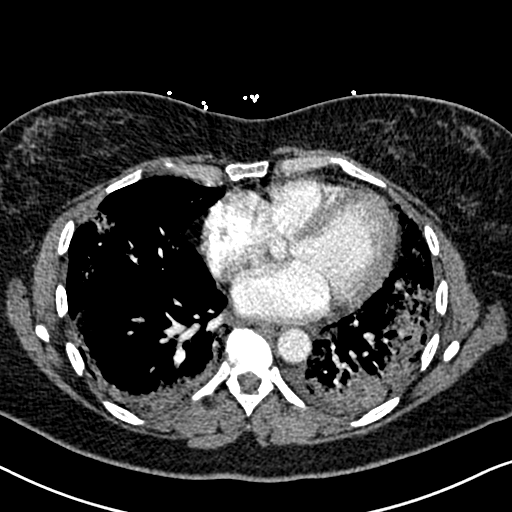
[im 149/297  lung]
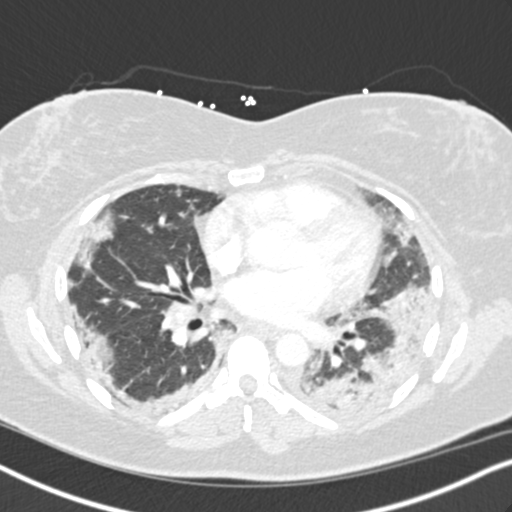
[im 165/297  mediastinal]
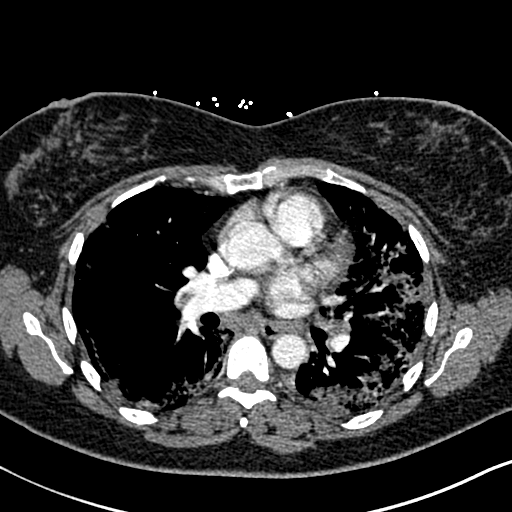
[im 181/297  lung]
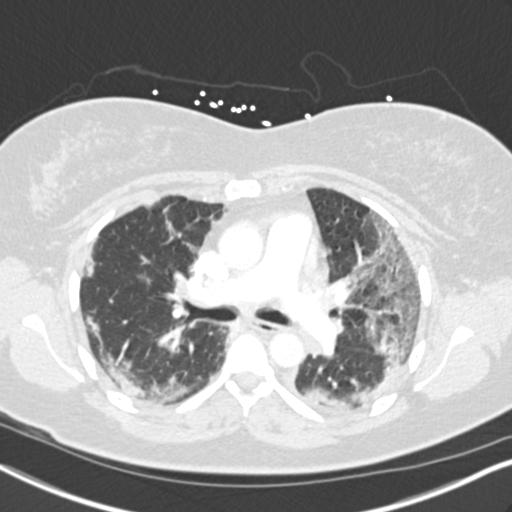
[im 198/297  mediastinal]
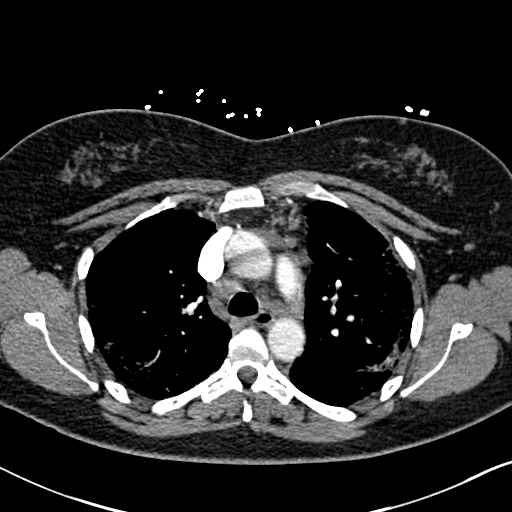
[im 214/297  lung]
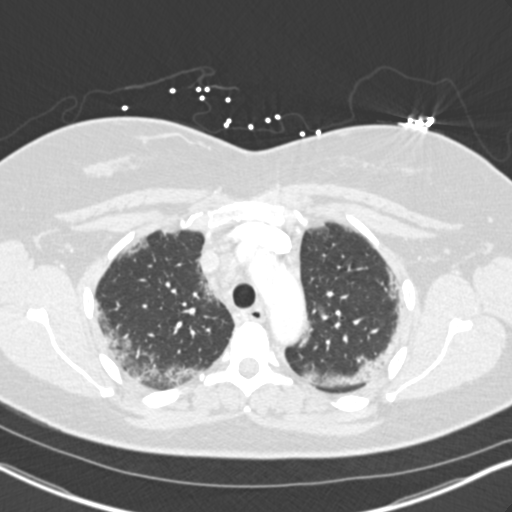
[im 231/297  mediastinal]
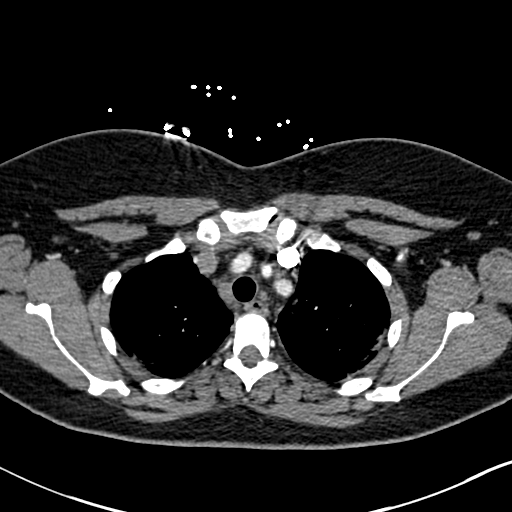
[im 247/297  lung]
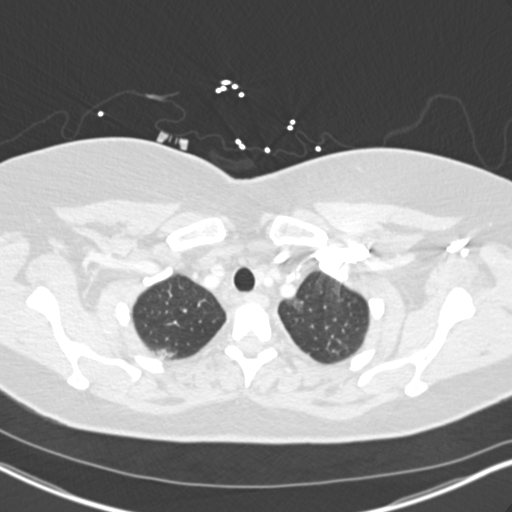
[im 264/297  mediastinal]
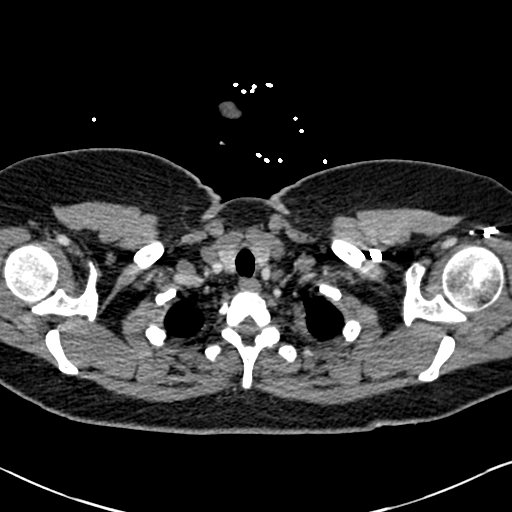
[im 280/297  lung]
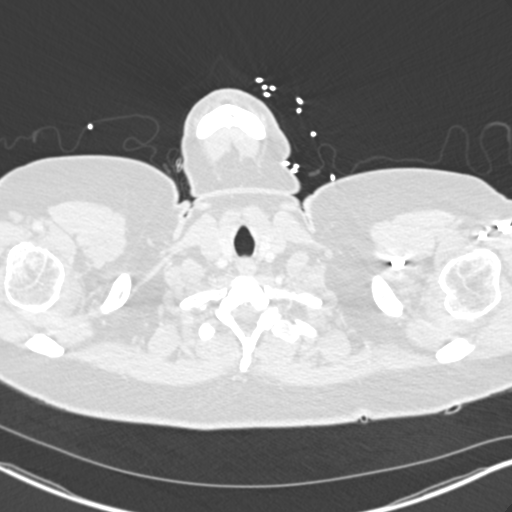

[Series 7: coronal mpr · coronal · 0.62mm/px · 1 of 130 slices shown]
[im 65/130  mediastinal]
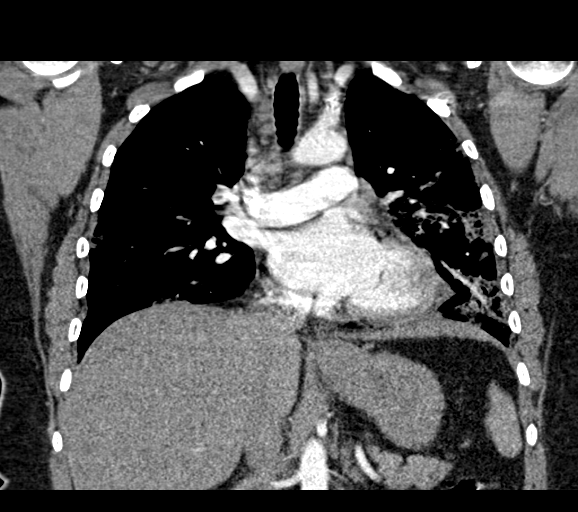

[18 of 36 positions shown; findings below may reference images not displayed]

FINDINGS: Cardiovascular: This is a technically adequate study but respiratory
motion artifact decreases sensitivity.

Segmental pulmonary emboli within the RIGHT upper, middle and lower
lobes noted.

No other definite pulmonary emboli are noted.

UPPER limits normal heart size noted.

There is no evidence of thoracic aortic aneurysm or pericardial
effusion.

Mediastinum/Nodes: UPPER limits of normal mediastinal and hilar
lymph nodes are likely reactive. No mediastinal mass or collection
identified. No thyroid, trachea or esophagus abnormalities noted.

Lungs/Pleura: Scattered ground-glass and airspace opacities within
both lungs noted, greatest in the LOWER lungs and likely
representing pneumonia/infection. No discrete mass, pleural effusion
or pneumothorax noted.

Upper Abdomen: No acute abnormality.

Musculoskeletal: No acute or suspicious bony abnormalities noted.

Review of the MIP images confirms the above findings.
IMPRESSION: 1. Segmental pulmonary emboli within the RIGHT upper, middle and
lower lobes.
2. Scattered ground-glass and airspace opacities within both lungs,
greatest in the LOWER lungs, likely pneumonia/infection.

Critical Value/emergent results were called by telephone at the time
of interpretation on 09/02/2020 at [DATE] to provider Dr. Francken,
who verbally acknowledged these results.

## 2022-10-10 ENCOUNTER — Other Ambulatory Visit (INDEPENDENT_AMBULATORY_CARE_PROVIDER_SITE_OTHER): Payer: Self-pay | Admitting: Family Medicine

## 2022-10-10 DIAGNOSIS — F3289 Other specified depressive episodes: Secondary | ICD-10-CM

## 2022-10-11 ENCOUNTER — Encounter (INDEPENDENT_AMBULATORY_CARE_PROVIDER_SITE_OTHER): Payer: Self-pay | Admitting: Family Medicine

## 2022-10-11 ENCOUNTER — Ambulatory Visit (INDEPENDENT_AMBULATORY_CARE_PROVIDER_SITE_OTHER): Payer: 59 | Admitting: Family Medicine

## 2022-10-11 VITALS — BP 118/84 | HR 63 | Temp 97.7°F | Ht 66.0 in | Wt 219.0 lb

## 2022-10-11 DIAGNOSIS — N951 Menopausal and female climacteric states: Secondary | ICD-10-CM

## 2022-10-11 DIAGNOSIS — N959 Unspecified menopausal and perimenopausal disorder: Secondary | ICD-10-CM

## 2022-10-11 DIAGNOSIS — E559 Vitamin D deficiency, unspecified: Secondary | ICD-10-CM | POA: Diagnosis not present

## 2022-10-11 DIAGNOSIS — F3289 Other specified depressive episodes: Secondary | ICD-10-CM

## 2022-10-11 DIAGNOSIS — Z6835 Body mass index (BMI) 35.0-35.9, adult: Secondary | ICD-10-CM

## 2022-10-11 DIAGNOSIS — E669 Obesity, unspecified: Secondary | ICD-10-CM | POA: Diagnosis not present

## 2022-10-11 MED ORDER — BUPROPION HCL ER (SR) 200 MG PO TB12: 200.0000 mg | ORAL_TABLET | Freq: Two times a day (BID) | ORAL | 0 refills | Status: DC

## 2022-10-11 MED ORDER — VITAMIN D (ERGOCALCIFEROL) 1.25 MG (50000 UNIT) PO CAPS: 50000.0000 [IU] | ORAL_CAPSULE | ORAL | 0 refills | Status: DC

## 2022-10-11 MED ORDER — ESCITALOPRAM OXALATE 10 MG PO TABS: 10.0000 mg | ORAL_TABLET | Freq: Every day | ORAL | 0 refills | Status: DC

## 2022-10-13 ENCOUNTER — Ambulatory Visit (INDEPENDENT_AMBULATORY_CARE_PROVIDER_SITE_OTHER): Payer: 59 | Admitting: Psychology

## 2022-10-13 DIAGNOSIS — F4321 Adjustment disorder with depressed mood: Secondary | ICD-10-CM

## 2022-10-13 NOTE — Progress Notes (Signed)
[facility] Counselor/Therapist Progress Note  Patient ID: Sherry Marquez, MRN: [id number],    Date: 10/13/2022  Time Spent: 10:00am - 10:55am   55 minutes   Treatment Type: Individual Therapy  Reported Symptoms: stress  Mental Status Exam: Appearance:  Casual     Behavior: Appropriate  Motor: Normal  Speech/Language:  Normal Rate  Affect: Appropriate  Mood: normal  Thought process: normal  Thought content:   WNL  Sensory/Perceptual disturbances:   WNL  Orientation: oriented to person, place, time/date, and situation  Attention: Good  Concentration: Good  Memory: WNL  Fund of knowledge:  Good  Insight:   Good  Judgment:  Good  Impulse Control: Good   Risk Assessment: Danger to Self:  No Self-injurious Behavior: No Danger to Others: No Duty to Warn:no Physical Aggression / Violence:No  Access to Firearms a concern: No  Gang Involvement:No   Subjective: Pt present for face-to-face individual therapy via video Webex.  Pt consents to telehealth video session due to COVID 19 pandemic. Location of pt: home Location of therapist: home office.   Pt talked about feeling overwhelmed bc of having a lot of doctors appointments next week.   Addressed pt's feelings and worked on Child psychotherapist.   Pt talked about issues with her dog that are a stressor for her.  Addressed the issues and helped pt problem solve.  Pt may sign up the dog for doggie daycare.   Pt has been working on having a voice about issues that are important to her.  Pt tends to be conflict avoidant.  Addressed how this stems from her childhood.   Worked on self care strategies.  Provided supportive therapy.  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  Treatment Plan(treatment plan target date 06/29/2023) Client Abilities/Strengths  Pt is bright, engaging, and motivated for therapy.   Client Treatment  Preferences  Individual therapy.  Client Statement of Needs  Improve coping skills.  Symptoms  Depressed or irritable mood. Low self-esteem. Unresolved grief issues.   Problems Addressed  Unipolar Depression Goals 1. Alleviate depressive symptoms and return to previous level of effective functioning. 2. Appropriately grieve the loss in order to normalize mood and to return to previously adaptive level of functioning. Objective Learn and implement behavioral strategies to overcome depression. Target Date: 2023-06-29 Frequency: Biweekly  Progress: 40 Modality: individual  Related Interventions Engage the client in "behavioral activation," increasing his/her activity level and contact with sources of reward, while identifying processes that inhibit activation.  Use behavioral techniques such as instruction, rehearsal, role-playing, role reversal, as needed, to facilitate activity in the client's daily life; reinforce success. Assist the client in developing skills that increase the likelihood of deriving pleasure from behavioral activation (e.g., assertiveness skills, developing an exercise plan, less internal/more external focus, increased social involvement); reinforce success. Objective Identify important people in life, past and present, and describe the quality, good and poor, of those relationships. Target Date: 2023-06-29 Frequency: Biweekly  Progress: 40 Modality: individual  Related Interventions Conduct Interpersonal Therapy beginning with the assessment of the client's "interpersonal inventory" of important past and present relationships; develop a case formulation linking depression to grief, interpersonal role disputes, role transitions, and/or interpersonal deficits). Objective Learn and implement problem-solving and decision-making skills. Target Date: 2023-06-29 Frequency: Biweekly  Progress: 40 Modality: individual  Related Interventions Conduct Problem-Solving Therapy  using techniques such as psychoeducation, modeling, and role-playing to teach client problem-solving skills (i.e.,  defining a problem specifically, generating possible solutions, evaluating the pros and cons of each solution, selecting and implementing a plan of action, evaluating the efficacy of the plan, accepting or revising the plan); role-play application of the problem-solving skill to a real life issue. Encourage in the client the development of a positive problem orientation in which problems and solving them are viewed as a natural part of life and not something to be feared, despaired, or avoided. 3. Develop healthy interpersonal relationships that lead to the alleviation and help prevent the relapse of depression. 4. Develop healthy thinking patterns and beliefs about self, others, and the world that lead to the alleviation and help prevent the relapse of depression. 5. Recognize, accept, and cope with feelings of depression. Diagnosis F43.21 Conditions For Discharge Achievement of treatment goals and objectives   Clint Bolder, LCSW

## 2022-10-25 NOTE — Progress Notes (Unsigned)
Chief Complaint:   OBESITY Sherry Marquez is here to discuss her progress with her obesity treatment plan along with follow-up of her obesity related diagnoses. Sherry Marquez is on the Category 3 Plan and states she is following her eating plan approximately 70% of the time. Sherry Marquez states she is walking 10,000 steps for 45 minutes 5 times per week.  Today's visit was #: 24 Starting weight: 232 lbs Starting date: 04/28/2021 Today's weight: 219 lbs Today's date: 10/11/2022 Total lbs lost to date: 13 Total lbs lost since last in-office visit: 0  Interim History: Sherry Marquez is retaining a bit of fluid today.  She struggled a bit more with meeting her protein goals in the last couple of weeks.  Subjective:   1. Vitamin D deficiency Sherry Marquez is on vitamin D with no signs of over replacement.  She needs a refill today.  2. Perimenopause Sherry Marquez has a new diagnosis.  She notes hot flashes and decreased sleep quality with some increased daytime fatigue.  It has been approximately 10 months since her last menses.  3. Emotional Eating Behavior Sherry Marquez is working on decreasing emotional eating behavior.  Her blood pressure is stable, she notes some insomnia but not likely due to Wellbutrin.  Assessment/Plan:   1. Vitamin D deficiency [place] will continue prescription vitamin D, and we will refill for 1 month.  - Vitamin D, Ergocalciferol, (DRISDOL) 1.25 MG (50000 UNIT) CAPS capsule; Take 1 capsule (50,000 Units total) by mouth every 7 (seven) days.  Dispense: 4 capsule; Refill: 0  2. Perimenopause Sherry Marquez agreed to start Lexapro 10 mg once daily with no refills.  She is working on decreasing simple carbohydrates and improved sleep habits.  We will follow-up in 1 month.  - escitalopram (LEXAPRO) 10 MG tablet; Take 1 tablet (10 mg total) by mouth daily.  Dispense: 30 tablet; Refill: 0  3. Emotional Eating Behavior Sherry Marquez will continue Wellbutrin SR, and we will refill for 1 month.   - buPROPion (WELLBUTRIN SR) 200 MG  12 hr tablet; Take 1 tablet (200 mg total) by mouth 2 (two) times daily.  Dispense: 60 tablet; Refill: 0  4. Obesity with Beginning BMI 37.45  5. BMI 35.0-35.9,adult Sherry Marquez is currently in the action stage of change. As such, her goal is to continue with weight loss efforts. She has agreed to change to keeping a food journal and adhering to recommended goals of 1400-1600 calories and 90+ grams of protein daily.   Exercise goals: As is.   Behavioral modification strategies: increasing lean protein intake and keeping a strict food journal.  Sherry Marquez has agreed to follow-up with our clinic in 4 weeks. She was informed of the importance of frequent follow-up visits to maximize her success with intensive lifestyle modifications for her multiple health conditions.   Objective:   Blood pressure 118/84, pulse 63, temperature 97.7 F (36.5 C), height 5\' 6"  (1.676 m), weight 219 lb (99.3 kg), SpO2 99 %. Body mass index is 35.35 kg/m.  Lab Results  Component Value Date   CREATININE 0.88 09/13/2022   BUN 21 09/13/2022   NA 141 09/13/2022   K 4.6 09/13/2022   CL 102 09/13/2022   CO2 25 09/13/2022   Lab Results  Component Value Date   ALT 19 09/13/2022   AST 19 09/13/2022   ALKPHOS 113 09/13/2022   BILITOT 0.3 09/13/2022   Lab Results  Component Value Date   HGBA1C 6.0 (H) 09/13/2022   HGBA1C 5.5 09/20/2021   HGBA1C 5.7 02/24/2021  HGBA1C 5.7 10/29/2013   Lab Results  Component Value Date   INSULIN 10.1 09/13/2022   INSULIN 9.4 09/20/2021   INSULIN 12.2 04/28/2021   Lab Results  Component Value Date   TSH 2.440 09/13/2022   Lab Results  Component Value Date   CHOL 249 (H) 09/13/2022   HDL 56 09/13/2022   LDLCALC 182 (H) 09/13/2022   TRIG 65 09/13/2022   CHOLHDL 4 02/24/2021   Lab Results  Component Value Date   VD25OH 32.9 09/13/2022   VD25OH 30.6 02/28/2022   VD25OH 42.9 09/20/2021   Lab Results  Component Value Date   WBC 4.6 02/24/2021   HGB 15.0 02/24/2021    HCT 44.0 02/24/2021   MCV 89.8 02/24/2021   PLT 241.0 02/24/2021   Lab Results  Component Value Date   FERRITIN 58 09/05/2020   Attestation Statements:   Reviewed by clinician on day of visit: allergies, medications, problem list, medical history, surgical history, family history, social history, and previous encounter notes.   I, Trixie Dredge, am acting as transcriptionist for Dennard Nip, MD.  I have reviewed the above documentation for accuracy and completeness, and I agree with the above. -  Dennard Nip, MD

## 2022-10-26 ENCOUNTER — Encounter: Payer: Self-pay | Admitting: Nurse Practitioner

## 2022-10-27 ENCOUNTER — Ambulatory Visit (INDEPENDENT_AMBULATORY_CARE_PROVIDER_SITE_OTHER): Payer: 59 | Admitting: Nurse Practitioner

## 2022-10-27 ENCOUNTER — Encounter: Payer: Self-pay | Admitting: Nurse Practitioner

## 2022-10-27 VITALS — BP 118/82 | HR 76 | Ht 65.25 in | Wt 224.0 lb

## 2022-10-27 DIAGNOSIS — Z01419 Encounter for gynecological examination (general) (routine) without abnormal findings: Secondary | ICD-10-CM

## 2022-10-27 DIAGNOSIS — N951 Menopausal and female climacteric states: Secondary | ICD-10-CM

## 2022-10-27 NOTE — Progress Notes (Signed)
   Sherry Marquez 1971/04/09 [id number]   History:  52 y.o. [id number] presents for annual exam. LMP April 2023. Mild hot flashes Normal pap and mammogram history. 7 cm right ovarian cystectomy 05/2020, repeat ultrasound showed 2 cm cyst present. 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 Depression managed by behavioral health.   Gynecologic History No LMP recorded. Patient is perimenopausal.   Contraception/Family planning: vasectomy Sexually active: Yes  Health Maintenance Last Pap: 10/25/2021. Results were: Normal neg HPV, 5-year repeat Last mammogram: 10/12/2021. Results were: Normal Last colonoscopy: Never. Negative Cologuard 09/22/2021 Last Dexa: Never  Past medical history, past surgical history, family history and social history were all reviewed and documented in the EPIC chart. Married. SAHM. 2 sons ages 25 (8th grade) and 17(senior, plans for [employer] in the fall for Estate manager/land agent path, on spectrum). Significant family history of heart disease.   ROS:  A ROS was performed and pertinent positives and negatives are included.  Exam:  Vitals:   10/27/22 1027  BP: 118/82  Pulse: 76  SpO2: 99%  Weight: 224 lb (101.6 kg)  Height: 5' 5.25" (1.657 m)     Body mass index is 36.99 kg/m.  General appearance:  Normal Thyroid:  Symmetrical, normal in size, without palpable masses or nodularity. Respiratory  Auscultation:  Clear without wheezing or rhonchi Cardiovascular  Auscultation:  Regular rate, without rubs, murmurs or gallops  Edema/varicosities:  Not grossly evident Abdominal  Soft,nontender, without masses, guarding or rebound.  Liver/spleen:  No organomegaly noted  Hernia:  None appreciated  Skin  Inspection:  Grossly normal   Breasts: Examined lying and sitting.   Right: Without masses, retractions, discharge or axillary adenopathy.   Left: Without masses, retractions, discharge or axillary adenopathy. Genitourinary   Inguinal/mons:  Normal without inguinal  adenopathy  External genitalia:  Normal appearing vulva with no masses, tenderness, or lesions  BUS/Urethra/Skene's glands:  Normal  Vagina:  Normal appearing with normal color and discharge, no lesions  Cervix:  Normal appearing without discharge or lesions  Uterus:  Normal in size, shape and contour.  Midline and mobile, nontender  Adnexa/parametria:     Rt: Normal in size, without masses or tenderness.   Lt: Normal in size, without masses or tenderness.  Anus and perineum: Normal  Digital rectal exam: Deferred  Patient informed chaperone available to be present for breast and pelvic exam. Patient has requested no chaperone to be present. Patient has been advised what will be completed during breast and pelvic exam.   Assessment/Plan:  52 y.o. [id number] for annual exam.   Well female exam with routine gynecological exam - Education provided on SBEs, importance of preventative screenings, current guidelines, high calcium diet, regular exercise, and multivitamin daily. Labs with PCP.   Perimenopause - LMP April 2023. Mild hot flashes. List of OTC supplements provided. Contraindication for ERT due to H/O PE/DVT.   Screening for cervical cancer - Normal Pap history. Will repeat at 5-year interval per guidelines.   Screening for breast cancer - Normal mammogram history.  Continue annual screenings.  Normal breast exam today.  Screening for colon cancer - Negative Cologuard 09/2021.   Return in 1 year for annual.     Tamela Gammon DNP, 10:57 AM 10/27/2022

## 2022-10-28 ENCOUNTER — Ambulatory Visit (INDEPENDENT_AMBULATORY_CARE_PROVIDER_SITE_OTHER): Payer: 59 | Admitting: Psychology

## 2022-10-28 DIAGNOSIS — F4321 Adjustment disorder with depressed mood: Secondary | ICD-10-CM | POA: Diagnosis not present

## 2022-10-28 NOTE — Progress Notes (Signed)
[facility] Counselor/Therapist Progress Note  Patient ID: Sherry Marquez, MRN: [id number],    Date: 10/28/2022  Time Spent: 10:00am - 10:55am   55 minutes   Treatment Type: Individual Therapy  Reported Symptoms: stress  Mental Status Exam: Appearance:  Casual     Behavior: Appropriate  Motor: Normal  Speech/Language:  Normal Rate  Affect: Appropriate  Mood: normal  Thought process: normal  Thought content:   WNL  Sensory/Perceptual disturbances:   WNL  Orientation: oriented to person, place, time/date, and situation  Attention: Good  Concentration: Good  Memory: WNL  Fund of knowledge:  Good  Insight:   Good  Judgment:  Good  Impulse Control: Good   Risk Assessment: Danger to Self:  No Self-injurious Behavior: No Danger to Others: No Duty to Warn:no Physical Aggression / Violence:No  Access to Firearms a concern: No  Gang Involvement:No   Subjective: Pt present for face-to-face individual therapy via video Webex.  Pt consents to telehealth video session due to COVID 19 pandemic. Location of pt: home Location of therapist: home office.   Pt talked about her husband Sherry Marquez having a difficult time at work recently.  Addressed the issues at [name]'s work and how they are affecting Sherry Marquez and pt.  Pt feels upset about how Sherry Marquez feels.  He feels betrayed by his boss and employer.  Pt was tearful as she talked about her concern for Sherry Marquez.  Helped pt process her thoughts and feelings.   Pt talked about her worries about her son Sherry Marquez.  He is applying to a couple of high school programs to include IB program at [name] and an early college program.  Addressed pt's concerns and worries.   Pt talked about her friend moving to [name] this summer.  Pt will miss her bc they walk together weekly.   Worked on self care strategies.  Provided supportive therapy.  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  Treatment Plan(treatment plan target date 06/29/2023) Client Abilities/Strengths  Pt is bright, engaging, and motivated for therapy.   Client Treatment Preferences  Individual therapy.  Client Statement of Needs  Improve coping skills.  Symptoms  Depressed or irritable mood. Low self-esteem. Unresolved grief issues.   Problems Addressed  Unipolar Depression Goals 1. Alleviate depressive symptoms and return to previous level of effective functioning. 2. Appropriately grieve the loss in order to normalize mood and to return to previously adaptive level of functioning. Objective Learn and implement behavioral strategies to overcome depression. Target Date: 2023-06-29 Frequency: Biweekly  Progress: 40 Modality: individual  Related Interventions Engage the client in "behavioral activation," increasing his/her activity level and contact with sources of reward, while identifying processes that inhibit activation.  Use behavioral techniques such as instruction, rehearsal, role-playing, role reversal, as needed, to facilitate activity in the client's daily life; reinforce success. Assist the client in developing skills that increase the likelihood of deriving pleasure from behavioral activation (e.g., assertiveness skills, developing an exercise plan, less internal/more external focus, increased social involvement); reinforce success. Objective Identify important people in life, past and present, and describe the quality, good and poor, of those relationships. Target Date: 2023-06-29 Frequency: Biweekly  Progress: 40 Modality: individual  Related Interventions Conduct Interpersonal Therapy beginning with the assessment of the client's "interpersonal inventory" of important past and present relationships; develop a case formulation linking depression to grief, interpersonal role disputes, role transitions, and/or interpersonal deficits). Objective Learn and implement  problem-solving and decision-making skills. Target Date: 2023-06-29 Frequency: Biweekly  Progress: 40 Modality: individual  Related Interventions Conduct Problem-Solving Therapy using techniques such as psychoeducation, modeling, and role-playing to teach client problem-solving skills (i.e., defining a problem specifically, generating possible solutions, evaluating the pros and cons of each solution, selecting and implementing a plan of action, evaluating the efficacy of the plan, accepting or revising the plan); role-play application of the problem-solving skill to a real life issue. Encourage in the client the development of a positive problem orientation in which problems and solving them are viewed as a natural part of life and not something to be feared, despaired, or avoided. 3. Develop healthy interpersonal relationships that lead to the alleviation and help prevent the relapse of depression. 4. Develop healthy thinking patterns and beliefs about self, others, and the world that lead to the alleviation and help prevent the relapse of depression. 5. Recognize, accept, and cope with feelings of depression. Diagnosis F43.21 Conditions For Discharge Achievement of treatment goals and objectives   Clint Bolder, LCSW

## 2022-11-09 ENCOUNTER — Encounter: Payer: Self-pay | Admitting: Nurse Practitioner

## 2022-11-10 ENCOUNTER — Ambulatory Visit (INDEPENDENT_AMBULATORY_CARE_PROVIDER_SITE_OTHER): Payer: 59 | Admitting: Family Medicine

## 2022-11-10 ENCOUNTER — Encounter (INDEPENDENT_AMBULATORY_CARE_PROVIDER_SITE_OTHER): Payer: Self-pay | Admitting: Family Medicine

## 2022-11-10 VITALS — BP 119/80 | HR 56 | Temp 97.3°F | Ht 66.0 in | Wt 220.0 lb

## 2022-11-10 DIAGNOSIS — R7303 Prediabetes: Secondary | ICD-10-CM

## 2022-11-10 DIAGNOSIS — F3289 Other specified depressive episodes: Secondary | ICD-10-CM

## 2022-11-10 DIAGNOSIS — Z6835 Body mass index (BMI) 35.0-35.9, adult: Secondary | ICD-10-CM

## 2022-11-10 DIAGNOSIS — E669 Obesity, unspecified: Secondary | ICD-10-CM | POA: Diagnosis not present

## 2022-11-10 DIAGNOSIS — E559 Vitamin D deficiency, unspecified: Secondary | ICD-10-CM

## 2022-11-10 MED ORDER — VITAMIN D (ERGOCALCIFEROL) 1.25 MG (50000 UNIT) PO CAPS: 50000.0000 [IU] | ORAL_CAPSULE | ORAL | 0 refills | Status: DC

## 2022-11-10 MED ORDER — BUPROPION HCL ER (SR) 200 MG PO TB12: 200.0000 mg | ORAL_TABLET | Freq: Two times a day (BID) | ORAL | 0 refills | Status: DC

## 2022-11-10 MED ORDER — METFORMIN HCL 500 MG PO TABS: 500.0000 mg | ORAL_TABLET | Freq: Every day | ORAL | 0 refills | Status: DC

## 2022-11-11 ENCOUNTER — Ambulatory Visit (INDEPENDENT_AMBULATORY_CARE_PROVIDER_SITE_OTHER): Payer: 59 | Admitting: Psychology

## 2022-11-11 DIAGNOSIS — F4321 Adjustment disorder with depressed mood: Secondary | ICD-10-CM

## 2022-11-11 NOTE — Progress Notes (Signed)
[facility] Counselor/Therapist Progress Note  Patient ID: Sherry Marquez, MRN: [id number],    Date: 11/11/2022  Time Spent: 11:00am - 11:55am   55 minutes   Treatment Type: Individual Therapy  Reported Symptoms: stress  Mental Status Exam: Appearance:  Casual     Behavior: Appropriate  Motor: Normal  Speech/Language:  Normal Rate  Affect: Appropriate  Mood: normal  Thought process: normal  Thought content:   WNL  Sensory/Perceptual disturbances:   WNL  Orientation: oriented to person, place, time/date, and situation  Attention: Good  Concentration: Good  Memory: WNL  Fund of knowledge:  Good  Insight:   Good  Judgment:  Good  Impulse Control: Good   Risk Assessment: Danger to Self:  No Self-injurious Behavior: No Danger to Others: No Duty to Warn:no Physical Aggression / Violence:No  Access to Firearms a concern: No  Gang Involvement:No   Subjective: Pt present for face-to-face individual therapy via video Webex.  Pt consents to telehealth video session due to COVID 19 pandemic. Location of pt: home Location of therapist: home office.   Pt talked about having spring break with her kids last week.  They stayed home and pt was glad they didn't go anywhere.  They got new furniture for their home and pt really likes it.  Pt feels like the week was productive.   Pt talked about her husband Sherry Marquez's work situation.  There have been some stressful issues at his work that have affected pt bc of Sherry Marquez's reactions.  Sherry Marquez told pt he needs to pull back emotionally bc he feels depleted.   Addressed how this impacts pt.  Pt talked about Sherry Marquez talking to her about the weight she has been gaining.   He stated he is concerned about her health. He did not understand what is getting in pt's way to putting effort into working on her weight and health.  Pt is disappointed in herself for not working on her health.  Pt also tends to procrastinate on doing house work.  Pt agrees with  everything her husband said but it was hard for her to hear.  Helped pt process her feelings.  Worked on self care strategies.  Provided supportive therapy.  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  Treatment Plan(treatment plan target date 06/29/2023) Client Abilities/Strengths  Pt is bright, engaging, and motivated for therapy.   Client Treatment Preferences  Individual therapy.  Client Statement of Needs  Improve coping skills.  Symptoms  Depressed or irritable mood. Low self-esteem. Unresolved grief issues.   Problems Addressed  Unipolar Depression Goals 1. Alleviate depressive symptoms and return to previous level of effective functioning. 2. Appropriately grieve the loss in order to normalize mood and to return to previously adaptive level of functioning. Objective Learn and implement behavioral strategies to overcome depression. Target Date: 2023-06-29 Frequency: Biweekly  Progress: 40 Modality: individual  Related Interventions Engage the client in "behavioral activation," increasing his/her activity level and contact with sources of reward, while identifying processes that inhibit activation.  Use behavioral techniques such as instruction, rehearsal, role-playing, role reversal, as needed, to facilitate activity in the client's daily life; reinforce success. Assist the client in developing skills that increase the likelihood of deriving pleasure from behavioral activation (e.g., assertiveness skills, developing an exercise plan, less internal/more external focus, increased social involvement); reinforce success. Objective Identify important people in life, past and present, and describe the quality, good and  poor, of those relationships. Target Date: 2023-06-29 Frequency: Biweekly  Progress: 40 Modality: individual  Related Interventions Conduct Interpersonal Therapy beginning with  the assessment of the client's "interpersonal inventory" of important past and present relationships; develop a case formulation linking depression to grief, interpersonal role disputes, role transitions, and/or interpersonal deficits). Objective Learn and implement problem-solving and decision-making skills. Target Date: 2023-06-29 Frequency: Biweekly  Progress: 40 Modality: individual  Related Interventions Conduct Problem-Solving Therapy using techniques such as psychoeducation, modeling, and role-playing to teach client problem-solving skills (i.e., defining a problem specifically, generating possible solutions, evaluating the pros and cons of each solution, selecting and implementing a plan of action, evaluating the efficacy of the plan, accepting or revising the plan); role-play application of the problem-solving skill to a real life issue. Encourage in the client the development of a positive problem orientation in which problems and solving them are viewed as a natural part of life and not something to be feared, despaired, or avoided. 3. Develop healthy interpersonal relationships that lead to the alleviation and help prevent the relapse of depression. 4. Develop healthy thinking patterns and beliefs about self, others, and the world that lead to the alleviation and help prevent the relapse of depression. 5. Recognize, accept, and cope with feelings of depression. Diagnosis F43.21 Conditions For Discharge Achievement of treatment goals and objectives   Salomon Fick, LCSW

## 2022-11-14 NOTE — Progress Notes (Unsigned)
Chief Complaint:   OBESITY Sherry Marquez is here to discuss her progress with her obesity treatment plan along with follow-up of her obesity related diagnoses. Sherry Marquez is on keeping a food journal and adhering to recommended goals of 1400-1600 calories and 90+ grams of protein and states she is following her eating plan approximately 70% of the time. Sherry Marquez states she is walking and lifting weights for 30 minutes 5 times per week.  Today's visit was #: 25 Starting weight: 232 lbs Starting date: 04/28/2021 Today's weight: 220 lbs Today's date: 11/10/2022 Total lbs lost to date: 12 Total lbs lost since last in-office visit: 0  Interim History: Sherry Marquez has been working on increasing her protein but she is retaining a bit of fluid today. She has started strengthening exercises and she is working on following her plan more closely.   Subjective:   1. Vitamin D deficiency Sherry Marquez is on Vitamin D, and she denies nausea, vomiting, or muscle weakness.   2. Pre-diabetes Sherry Marquez's last A1c increased to 6.0. She is working on her diet but she is not on medication.   3. Emotional Eating Behavior Sherry Marquez decided to not start Lexapro. She is working on decreasing emotional eating behaviors. No side effects were noted with Wellbutrin.   Assessment/Plan:   1. Vitamin D deficiency Sherry Marquez will continue prescription Vitamin D, and we will refill for 1 month.   - Vitamin D, Ergocalciferol, (DRISDOL) 1.25 MG (50000 UNIT) CAPS capsule; Take 1 capsule (50,000 Units total) by mouth every 7 (seven) days.  Dispense: 4 capsule; Refill: 0  2. Pre-diabetes Sherry Marquez agreed to start metformin 500 mg q AM with no refills. We will follow-up at her next visit.   - metFORMIN (GLUCOPHAGE) 500 MG tablet; Take 1 tablet (500 mg total) by mouth daily with breakfast.  Dispense: 30 tablet; Refill: 0  3. Emotional Eating Behavior Sherry Marquez will continue Wellbutrin SR, and we will refill for 1 month.   - buPROPion (WELLBUTRIN SR) 200 MG 12  hr tablet; Take 1 tablet (200 mg total) by mouth 2 (two) times daily.  Dispense: 60 tablet; Refill: 0  4. BMI 35.0-35.9,adult  5. Obesity, Beginning BMI 37.45 Sherry Marquez is currently in the action stage of change. As such, her goal is to continue with weight loss efforts. She has agreed to keeping a food journal and adhering to recommended goals of 1400-1600 calories and 90+ grams of protein daily.   Exercise goals: As is.   Behavioral modification strategies: increasing lean protein intake and keeping a strict food journal.  Sherry Marquez has agreed to follow-up with our clinic in 4 weeks. She was informed of the importance of frequent follow-up visits to maximize her success with intensive lifestyle modifications for her multiple health conditions.   Objective:   Blood pressure 119/80, pulse (!) 56, temperature (!) 97.3 F (36.3 C), height 5\' 6"  (1.676 m), weight 220 lb (99.8 kg), SpO2 97 %. Body mass index is 35.51 kg/m.  Lab Results  Component Value Date   CREATININE 0.88 09/13/2022   BUN 21 09/13/2022   NA 141 09/13/2022   K 4.6 09/13/2022   CL 102 09/13/2022   CO2 25 09/13/2022   Lab Results  Component Value Date   ALT 19 09/13/2022   AST 19 09/13/2022   ALKPHOS 113 09/13/2022   BILITOT 0.3 09/13/2022   Lab Results  Component Value Date   HGBA1C 6.0 (H) 09/13/2022   HGBA1C 5.5 09/20/2021   HGBA1C 5.7 02/24/2021   HGBA1C 5.7  10/29/2013   Lab Results  Component Value Date   INSULIN 10.1 09/13/2022   INSULIN 9.4 09/20/2021   INSULIN 12.2 04/28/2021   Lab Results  Component Value Date   TSH 2.440 09/13/2022   Lab Results  Component Value Date   CHOL 249 (H) 09/13/2022   HDL 56 09/13/2022   LDLCALC 182 (H) 09/13/2022   TRIG 65 09/13/2022   CHOLHDL 4 02/24/2021   Lab Results  Component Value Date   VD25OH 32.9 09/13/2022   VD25OH 30.6 02/28/2022   VD25OH 42.9 09/20/2021   Lab Results  Component Value Date   WBC 4.6 02/24/2021   HGB 15.0 02/24/2021   HCT 44.0  02/24/2021   MCV 89.8 02/24/2021   PLT 241.0 02/24/2021   Lab Results  Component Value Date   FERRITIN 58 09/05/2020   Attestation Statements:   Reviewed by clinician on day of visit: allergies, medications, problem list, medical history, surgical history, family history, social history, and previous encounter notes.  I have personally spent 41 minutes total time today in preparation, patient care, and documentation for this visit, including the following: review of clinical lab tests; review of medical tests/procedures/services.   I, Burt Knack, am acting as transcriptionist for Quillian Quince, MD.  I have reviewed the above documentation for accuracy and completeness, and I agree with the above. -  Quillian Quince, MD

## 2022-11-24 ENCOUNTER — Ambulatory Visit: Payer: 59 | Admitting: Psychology

## 2022-11-24 DIAGNOSIS — F4321 Adjustment disorder with depressed mood: Secondary | ICD-10-CM

## 2022-11-24 NOTE — Progress Notes (Signed)
[facility] Counselor/Therapist Progress Note  Patient ID: AMBRIANA SELWAY, MRN: [id number],    Date: 11/24/2022  Time Spent: 11:00am - 11:50am   50 minutes   Treatment Type: Individual Therapy  Reported Symptoms: stress  Mental Status Exam: Appearance:  Casual     Behavior: Appropriate  Motor: Normal  Speech/Language:  Normal Rate  Affect: Appropriate  Mood: normal  Thought process: normal  Thought content:   WNL  Sensory/Perceptual disturbances:   WNL  Orientation: oriented to person, place, time/date, and situation  Attention: Good  Concentration: Good  Memory: WNL  Fund of knowledge:  Good  Insight:   Good  Judgment:  Good  Impulse Control: Good   Risk Assessment: Danger to Self:  No Self-injurious Behavior: No Danger to Others: No Duty to Warn:no Physical Aggression / Violence:No  Access to Firearms a concern: No  Gang Involvement:No   Subjective: Pt present for face-to-face individual therapy via video.  Pt consents to telehealth video session due to COVID 19 pandemic. Location of pt: home Location of therapist: home office.   Pt talked about doing well on her diet the past two weeks.   She has felt more motivated to follow her healthy food plan.  Worked on intuitive eating strategies.    Pt has also started an exercise program.  Pt has been more productive with house cleaning and projects as well.   She has been more successful not procrastinating.   Pt talked about extended family issues.   Her cousin is getting a divorce and this will be the first divorce on pt's mother's side of the family.  Addressed the family dynamics. Helped pt process how this impacts her.  Worked on self care strategies.  Provided supportive therapy.  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  Treatment Plan(treatment plan target date 06/29/2023) Client Abilities/Strengths  Pt is  bright, engaging, and motivated for therapy.   Client Treatment Preferences  Individual therapy.  Client Statement of Needs  Improve coping skills.  Symptoms  Depressed or irritable mood. Low self-esteem. Unresolved grief issues.   Problems Addressed  Unipolar Depression Goals 1. Alleviate depressive symptoms and return to previous level of effective functioning. 2. Appropriately grieve the loss in order to normalize mood and to return to previously adaptive level of functioning. Objective Learn and implement behavioral strategies to overcome depression. Target Date: 2023-06-29 Frequency: Biweekly  Progress: 40 Modality: individual  Related Interventions Engage the client in "behavioral activation," increasing his/her activity level and contact with sources of reward, while identifying processes that inhibit activation.  Use behavioral techniques such as instruction, rehearsal, role-playing, role reversal, as needed, to facilitate activity in the client's daily life; reinforce success. Assist the client in developing skills that increase the likelihood of deriving pleasure from behavioral activation (e.g., assertiveness skills, developing an exercise plan, less internal/more external focus, increased social involvement); reinforce success. Objective Identify important people in life, past and present, and describe the quality, good and poor, of those relationships. Target Date: 2023-06-29 Frequency: Biweekly  Progress: 40 Modality: individual  Related Interventions Conduct Interpersonal Therapy beginning with the assessment of the client's "interpersonal inventory" of important past and present relationships; develop a case formulation linking depression to grief, interpersonal role disputes, role transitions, and/or interpersonal deficits). Objective Learn and implement problem-solving and decision-making skills. Target Date: 2023-06-29 Frequency: Biweekly  Progress: 40 Modality:  individual  Related Interventions Conduct Problem-Solving Therapy using techniques  such as psychoeducation, modeling, and role-playing to teach client problem-solving skills (i.e., defining a problem specifically, generating possible solutions, evaluating the pros and cons of each solution, selecting and implementing a plan of action, evaluating the efficacy of the plan, accepting or revising the plan); role-play application of the problem-solving skill to a real life issue. Encourage in the client the development of a positive problem orientation in which problems and solving them are viewed as a natural part of life and not something to be feared, despaired, or avoided. 3. Develop healthy interpersonal relationships that lead to the alleviation and help prevent the relapse of depression. 4. Develop healthy thinking patterns and beliefs about self, others, and the world that lead to the alleviation and help prevent the relapse of depression. 5. Recognize, accept, and cope with feelings of depression. Diagnosis F43.21 Conditions For Discharge Achievement of treatment goals and objectives   Salomon Fick, LCSW

## 2022-12-06 ENCOUNTER — Ambulatory Visit (INDEPENDENT_AMBULATORY_CARE_PROVIDER_SITE_OTHER): Payer: 59 | Admitting: Family Medicine

## 2022-12-06 ENCOUNTER — Encounter (INDEPENDENT_AMBULATORY_CARE_PROVIDER_SITE_OTHER): Payer: Self-pay | Admitting: Family Medicine

## 2022-12-06 VITALS — BP 129/76 | HR 56 | Temp 98.0°F | Ht 66.0 in | Wt 213.0 lb

## 2022-12-06 DIAGNOSIS — E559 Vitamin D deficiency, unspecified: Secondary | ICD-10-CM

## 2022-12-06 DIAGNOSIS — E669 Obesity, unspecified: Secondary | ICD-10-CM

## 2022-12-06 DIAGNOSIS — F3289 Other specified depressive episodes: Secondary | ICD-10-CM | POA: Diagnosis not present

## 2022-12-06 DIAGNOSIS — Z6834 Body mass index (BMI) 34.0-34.9, adult: Secondary | ICD-10-CM | POA: Diagnosis not present

## 2022-12-06 MED ORDER — VITAMIN D (ERGOCALCIFEROL) 1.25 MG (50000 UNIT) PO CAPS: 50000.0000 [IU] | ORAL_CAPSULE | ORAL | 0 refills | Status: DC

## 2022-12-06 MED ORDER — BUPROPION HCL ER (SR) 200 MG PO TB12: 200.0000 mg | ORAL_TABLET | Freq: Two times a day (BID) | ORAL | 0 refills | Status: DC

## 2022-12-07 NOTE — Progress Notes (Unsigned)
Chief Complaint:   OBESITY Sherry Marquez is here to discuss her progress with her obesity treatment plan along with follow-up of her obesity related diagnoses. Sherry Marquez is on keeping a food journal and adhering to recommended goals of 1400-1600 calories and 90+ grams of protein and states she is following her eating plan approximately 90% of the time. Sherry Marquez states she is walking and weight lifting for 30 minutes 4 times per week.  Today's visit was #: 26 Starting weight: 232 lbs Starting date: 04/28/2021 Today's weight: 213 lbs Today's date: 12/06/2022 Total lbs lost to date: 19 Total lbs lost since last in-office visit: 7  Interim History: Sherry Marquez has done well with her weight loss.  She is doing a combination of category 3 and journaling, and she is exercising regularly.  Her sleep is doing well and her hunger is mostly controlled.  Subjective:   1. Vitamin D deficiency Sherry Marquez is on vitamin D, and she is doing well with decreased fatigue.  She denies muscle weakness.  2. Emotional Eating Behavior Sherry Marquez is doing better with meeting her nutrition goals and this has helped her decrease her emotional eating behavior.  No side effects were noted on Wellbutrin.  Assessment/Plan:   1. Vitamin D deficiency Sherry Marquez will continue prescription vitamin D, and we will refill for 1 month.  - Vitamin D, Ergocalciferol, (DRISDOL) 1.25 MG (50000 UNIT) CAPS capsule; Take 1 capsule (50,000 Units total) by mouth every 7 (seven) days.  Dispense: 4 capsule; Refill: 0  2. Emotional Eating Behavior We will refill Wellbutrin SR at 200 mg twice daily for 1 month.  Sherry Marquez will continue to use emotional eating behavior strategies.  - buPROPion (WELLBUTRIN SR) 200 MG 12 hr tablet; Take 1 tablet (200 mg total) by mouth 2 (two) times daily.  Dispense: 60 tablet; Refill: 0  3. BMI 34.0-34.9,adult  4. Obesity, Beginning BMI 37.45 Sherry Marquez is currently in the action stage of change. As such, her goal is to continue with  weight loss efforts. She has agreed to the Category 3 Plan and keeping a food journal and adhering to recommended goals of 1400-1600 calories and 90+ grams of protein daily.   Exercise goals: As is.   Behavioral modification strategies: increasing lean protein intake and meal planning and cooking strategies.  Sherry Marquez has agreed to follow-up with our clinic in 4 weeks. She was informed of the importance of frequent follow-up visits to maximize her success with intensive lifestyle modifications for her multiple health conditions.   Objective:   Blood pressure 129/76, pulse (!) 56, temperature 98 F (36.7 C), height 5\' 6"  (1.676 m), weight 213 lb (96.6 kg), SpO2 99 %. Body mass index is 34.38 kg/m.  Lab Results  Component Value Date   CREATININE 0.88 09/13/2022   BUN 21 09/13/2022   NA 141 09/13/2022   K 4.6 09/13/2022   CL 102 09/13/2022   CO2 25 09/13/2022   Lab Results  Component Value Date   ALT 19 09/13/2022   AST 19 09/13/2022   ALKPHOS 113 09/13/2022   BILITOT 0.3 09/13/2022   Lab Results  Component Value Date   HGBA1C 6.0 (H) 09/13/2022   HGBA1C 5.5 09/20/2021   HGBA1C 5.7 02/24/2021   HGBA1C 5.7 10/29/2013   Lab Results  Component Value Date   INSULIN 10.1 09/13/2022   INSULIN 9.4 09/20/2021   INSULIN 12.2 04/28/2021   Lab Results  Component Value Date   TSH 2.440 09/13/2022   Lab Results  Component Value  Date   CHOL 249 (H) 09/13/2022   HDL 56 09/13/2022   LDLCALC 182 (H) 09/13/2022   TRIG 65 09/13/2022   CHOLHDL 4 02/24/2021   Lab Results  Component Value Date   VD25OH 32.9 09/13/2022   VD25OH 30.6 02/28/2022   VD25OH 42.9 09/20/2021   Lab Results  Component Value Date   WBC 4.6 02/24/2021   HGB 15.0 02/24/2021   HCT 44.0 02/24/2021   MCV 89.8 02/24/2021   PLT 241.0 02/24/2021   Lab Results  Component Value Date   FERRITIN 58 09/05/2020   Attestation Statements:   Reviewed by clinician on day of visit: allergies, medications, problem  list, medical history, surgical history, family history, social history, and previous encounter notes.   I, Burt Knack, am acting as transcriptionist for Quillian Quince, MD.  I have reviewed the above documentation for accuracy and completeness, and I agree with the above. -  Quillian Quince, MD

## 2022-12-08 ENCOUNTER — Ambulatory Visit (INDEPENDENT_AMBULATORY_CARE_PROVIDER_SITE_OTHER): Payer: 59 | Admitting: Psychology

## 2022-12-08 DIAGNOSIS — F4321 Adjustment disorder with depressed mood: Secondary | ICD-10-CM

## 2022-12-08 NOTE — Progress Notes (Signed)
[facility] Counselor/Therapist Progress Note  Patient ID: Sherry Marquez, MRN: [id number],    Date: 12/08/2022  Time Spent: 10:00am - 10:50am   50 minutes   Treatment Type: Individual Therapy  Reported Symptoms: stress  Mental Status Exam: Appearance:  Casual     Behavior: Appropriate  Motor: Normal  Speech/Language:  Normal Rate  Affect: Appropriate  Mood: normal  Thought process: normal  Thought content:   WNL  Sensory/Perceptual disturbances:   WNL  Orientation: oriented to person, place, time/date, and situation  Attention: Good  Concentration: Good  Memory: WNL  Fund of knowledge:  Good  Insight:   Good  Judgment:  Good  Impulse Control: Good   Risk Assessment: Danger to Self:  No Self-injurious Behavior: No Danger to Others: No Duty to Warn:no Physical Aggression / Violence:No  Access to Firearms a concern: No  Gang Involvement:No   Subjective: Pt present for face-to-face individual therapy via video.  Pt consents to telehealth video session due to COVID 19 pandemic. Location of pt: home Location of therapist: home office.   Pt talked about continuing to do well on her diet. She has felt more motivated to follow her healthy food plan.  Worked on intuitive eating strategies.    Pt has also started an exercise program and is being outside more.  She is walking with a friend but that friend is moving in a month so pt has to find other walking buddies.   Pt talked about her relationship with her husband.  He has been irritable and stressed bc of work.  He vents a lot to pt and that can get frustrating for her.  Helped pt process her feelings and relationship dynamics.   Worked on self care strategies.  Provided supportive therapy.  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  Treatment Plan(treatment plan target date 06/29/2023) Client Abilities/Strengths  Pt is  bright, engaging, and motivated for therapy.   Client Treatment Preferences  Individual therapy.  Client Statement of Needs  Improve coping skills.  Symptoms  Depressed or irritable mood. Low self-esteem. Unresolved grief issues.   Problems Addressed  Unipolar Depression Goals 1. Alleviate depressive symptoms and return to previous level of effective functioning. 2. Appropriately grieve the loss in order to normalize mood and to return to previously adaptive level of functioning. Objective Learn and implement behavioral strategies to overcome depression. Target Date: 2023-06-29 Frequency: Biweekly  Progress: 40 Modality: individual  Related Interventions Engage the client in "behavioral activation," increasing his/her activity level and contact with sources of reward, while identifying processes that inhibit activation.  Use behavioral techniques such as instruction, rehearsal, role-playing, role reversal, as needed, to facilitate activity in the client's daily life; reinforce success. Assist the client in developing skills that increase the likelihood of deriving pleasure from behavioral activation (e.g., assertiveness skills, developing an exercise plan, less internal/more external focus, increased social involvement); reinforce success. Objective Identify important people in life, past and present, and describe the quality, good and poor, of those relationships. Target Date: 2023-06-29 Frequency: Biweekly  Progress: 40 Modality: individual  Related Interventions Conduct Interpersonal Therapy beginning with the assessment of the client's "interpersonal inventory" of important past and present relationships; develop a case formulation linking depression to grief, interpersonal role disputes, role transitions, and/or interpersonal deficits). Objective Learn and implement problem-solving and decision-making skills. Target Date: 2023-06-29 Frequency: Biweekly  Progress: 40 Modality:  individual  Related Interventions Conduct  Problem-Solving Therapy using techniques such as psychoeducation, modeling, and role-playing to teach client problem-solving skills (i.e., defining a problem specifically, generating possible solutions, evaluating the pros and cons of each solution, selecting and implementing a plan of action, evaluating the efficacy of the plan, accepting or revising the plan); role-play application of the problem-solving skill to a real life issue. Encourage in the client the development of a positive problem orientation in which problems and solving them are viewed as a natural part of life and not something to be feared, despaired, or avoided. 3. Develop healthy interpersonal relationships that lead to the alleviation and help prevent the relapse of depression. 4. Develop healthy thinking patterns and beliefs about self, others, and the world that lead to the alleviation and help prevent the relapse of depression. 5. Recognize, accept, and cope with feelings of depression. Diagnosis F43.21 Conditions For Discharge Achievement of treatment goals and objectives   Sherry Fick, LCSW

## 2022-12-11 ENCOUNTER — Other Ambulatory Visit (INDEPENDENT_AMBULATORY_CARE_PROVIDER_SITE_OTHER): Payer: Self-pay | Admitting: Family Medicine

## 2022-12-11 DIAGNOSIS — R7303 Prediabetes: Secondary | ICD-10-CM

## 2022-12-22 ENCOUNTER — Ambulatory Visit (INDEPENDENT_AMBULATORY_CARE_PROVIDER_SITE_OTHER): Payer: 59 | Admitting: Psychology

## 2022-12-22 DIAGNOSIS — F4321 Adjustment disorder with depressed mood: Secondary | ICD-10-CM

## 2022-12-22 NOTE — Progress Notes (Signed)
[facility] Counselor/Therapist Progress Note  Patient ID: Sherry Marquez, MRN: [id number],    Date: 12/22/2022  Time Spent: 10:00am - 10:55am   55 minutes   Treatment Type: Individual Therapy  Reported Symptoms: stress  Mental Status Exam: Appearance:  Casual     Behavior: Appropriate  Motor: Normal  Speech/Language:  Normal Rate  Affect: Appropriate  Mood: normal  Thought process: normal  Thought content:   WNL  Sensory/Perceptual disturbances:   WNL  Orientation: oriented to person, place, time/date, and situation  Attention: Good  Concentration: Good  Memory: WNL  Fund of knowledge:  Good  Insight:   Good  Judgment:  Good  Impulse Control: Good   Risk Assessment: Danger to Self:  No Self-injurious Behavior: No Danger to Others: No Duty to Warn:no Physical Aggression / Violence:No  Access to Firearms a concern: No  Gang Involvement:No   Subjective: Pt present for face-to-face individual therapy via video.  Pt consents to telehealth video session due to COVID 19 pandemic. Location of pt: home Location of therapist: home office.   Pt talked about her cousin passing away suddenly last weekend.  Helped pt process her feelings and grief.   Pt talked about her husband [name] who is still struggling emotionally bc of work stress.  Addressed how this impacts pt.  [name] has been very irritable and pt is trying to not take it personally.  Helped pt process her feelings and relationship dynamics.   Pt talked about her weight and nutrition goals.  She ate more at Mother's Day but is trying to get back on track.   Worked on self care strategies.  Provided supportive therapy.  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  Treatment Plan(treatment plan target date 06/29/2023) Client Abilities/Strengths  Pt is bright, engaging, and motivated for therapy.   Client Treatment  Preferences  Individual therapy.  Client Statement of Needs  Improve coping skills.  Symptoms  Depressed or irritable mood. Low self-esteem. Unresolved grief issues.   Problems Addressed  Unipolar Depression Goals 1. Alleviate depressive symptoms and return to previous level of effective functioning. 2. Appropriately grieve the loss in order to normalize mood and to return to previously adaptive level of functioning. Objective Learn and implement behavioral strategies to overcome depression. Target Date: 2023-06-29 Frequency: Biweekly  Progress: 40 Modality: individual  Related Interventions Engage the client in "behavioral activation," increasing his/her activity level and contact with sources of reward, while identifying processes that inhibit activation.  Use behavioral techniques such as instruction, rehearsal, role-playing, role reversal, as needed, to facilitate activity in the client's daily life; reinforce success. Assist the client in developing skills that increase the likelihood of deriving pleasure from behavioral activation (e.g., assertiveness skills, developing an exercise plan, less internal/more external focus, increased social involvement); reinforce success. Objective Identify important people in life, past and present, and describe the quality, good and poor, of those relationships. Target Date: 2023-06-29 Frequency: Biweekly  Progress: 40 Modality: individual  Related Interventions Conduct Interpersonal Therapy beginning with the assessment of the client's "interpersonal inventory" of important past and present relationships; develop a case formulation linking depression to grief, interpersonal role disputes, role transitions, and/or interpersonal deficits). Objective Learn and implement problem-solving and decision-making skills. Target Date: 2023-06-29 Frequency: Biweekly  Progress: 40 Modality: individual  Related Interventions Conduct Problem-Solving Therapy  using techniques such as psychoeducation, modeling, and role-playing to teach client problem-solving skills (i.e., defining a  problem specifically, generating possible solutions, evaluating the pros and cons of each solution, selecting and implementing a plan of action, evaluating the efficacy of the plan, accepting or revising the plan); role-play application of the problem-solving skill to a real life issue. Encourage in the client the development of a positive problem orientation in which problems and solving them are viewed as a natural part of life and not something to be feared, despaired, or avoided. 3. Develop healthy interpersonal relationships that lead to the alleviation and help prevent the relapse of depression. 4. Develop healthy thinking patterns and beliefs about self, others, and the world that lead to the alleviation and help prevent the relapse of depression. 5. Recognize, accept, and cope with feelings of depression. Diagnosis F43.21 Conditions For Discharge Achievement of treatment goals and objectives   Salomon Fick, LCSW

## 2022-12-27 ENCOUNTER — Encounter (INDEPENDENT_AMBULATORY_CARE_PROVIDER_SITE_OTHER): Payer: Self-pay | Admitting: Family Medicine

## 2022-12-27 ENCOUNTER — Ambulatory Visit (INDEPENDENT_AMBULATORY_CARE_PROVIDER_SITE_OTHER): Payer: 59 | Admitting: Family Medicine

## 2022-12-27 VITALS — BP 121/81 | HR 57 | Temp 98.4°F | Ht 66.0 in | Wt 214.0 lb

## 2022-12-27 DIAGNOSIS — Z6834 Body mass index (BMI) 34.0-34.9, adult: Secondary | ICD-10-CM

## 2022-12-27 DIAGNOSIS — E669 Obesity, unspecified: Secondary | ICD-10-CM | POA: Diagnosis not present

## 2022-12-27 DIAGNOSIS — E559 Vitamin D deficiency, unspecified: Secondary | ICD-10-CM

## 2022-12-27 DIAGNOSIS — R7303 Prediabetes: Secondary | ICD-10-CM | POA: Diagnosis not present

## 2022-12-27 NOTE — Progress Notes (Signed)
Chief Complaint:   OBESITY Sherry Marquez is here to discuss her progress with her obesity treatment plan along with follow-up of her obesity related diagnoses. Sherry Marquez is on keeping a food journal and adhering to recommended goals of 1400-1600 calories and 90+ grams of protein and states she is following her eating plan approximately 80% of the time. Sherry Marquez states she is lifting weights and walking for 30 minutes 4-5 times per week.  Today's visit was #: 27 Starting weight: 232 lbs Starting date: 04/28/2021 Today's weight: 214 lbs Today's date: 12/27/2022 Total lbs lost to date: 18 Total lbs lost since last in-office visit: 0  Interim History: Sherry Marquez continues to work on her diet.  She is up 1 pound but I will bioimpedance scale suggests this is muscle gain.  She has been exercising regularly including strength training.  She has had a lot of celebration eating and then will be doing more social eating.  Subjective:   1. Vitamin D deficiency Sherry Marquez has questions about vitamin D and calcium levels.  She is working on increasing her vitamin K and magnesium.  She wonders why she runs low on vitamin D.  2. Prediabetes Sherry Marquez's A1c has increased to 6.0.  She was prescribed metformin but she has decided against taking it.  She continues to work on her diet and exercise.  Assessment/Plan:   1. Vitamin D deficiency Sherry Marquez will continue vitamin D prescription weekly.  All questions were answered today, and we will continue to monitor.  2. Prediabetes Sherry Marquez will discontinue metformin, and will continue as is.  We will recheck labs in 1 month to monitor her progress.  3. BMI 34.0-34.9,adult  4. Obesity, Beginning BMI 37.45 Sherry Marquez is currently in the action stage of change. As such, her goal is to continue with weight loss efforts. She has agreed to the Category 3 Plan.   Travel protein ideas were discussed.   Exercise goals: As is.   Behavioral modification strategies: increasing lean protein  intake, dealing with family or coworker sabotage, and travel eating strategies.  Sherry Marquez has agreed to follow-up with our clinic in 4 weeks. She was informed of the importance of frequent follow-up visits to maximize her success with intensive lifestyle modifications for her multiple health conditions.   Objective:   Blood pressure 121/81, pulse (!) 57, temperature 98.4 F (36.9 C), height 5\' 6"  (1.676 m), weight 214 lb (97.1 kg), SpO2 98 %. Body mass index is 34.54 kg/m.  Lab Results  Component Value Date   CREATININE 0.88 09/13/2022   BUN 21 09/13/2022   NA 141 09/13/2022   K 4.6 09/13/2022   CL 102 09/13/2022   CO2 25 09/13/2022   Lab Results  Component Value Date   ALT 19 09/13/2022   AST 19 09/13/2022   ALKPHOS 113 09/13/2022   BILITOT 0.3 09/13/2022   Lab Results  Component Value Date   HGBA1C 6.0 (H) 09/13/2022   HGBA1C 5.5 09/20/2021   HGBA1C 5.7 02/24/2021   HGBA1C 5.7 10/29/2013   Lab Results  Component Value Date   INSULIN 10.1 09/13/2022   INSULIN 9.4 09/20/2021   INSULIN 12.2 04/28/2021   Lab Results  Component Value Date   TSH 2.440 09/13/2022   Lab Results  Component Value Date   CHOL 249 (H) 09/13/2022   HDL 56 09/13/2022   LDLCALC 182 (H) 09/13/2022   TRIG 65 09/13/2022   CHOLHDL 4 02/24/2021   Lab Results  Component Value Date   VD25OH 32.9  09/13/2022   VD25OH 30.6 02/28/2022   VD25OH 42.9 09/20/2021   Lab Results  Component Value Date   WBC 4.6 02/24/2021   HGB 15.0 02/24/2021   HCT 44.0 02/24/2021   MCV 89.8 02/24/2021   PLT 241.0 02/24/2021   Lab Results  Component Value Date   FERRITIN 58 09/05/2020   Attestation Statements:   Reviewed by clinician on day of visit: allergies, medications, problem list, medical history, surgical history, family history, social history, and previous encounter notes.   I, Burt Knack, am acting as transcriptionist for Quillian Quince, MD.  I have reviewed the above documentation for  accuracy and completeness, and I agree with the above. -  Quillian Quince, MD

## 2023-01-05 ENCOUNTER — Ambulatory Visit (INDEPENDENT_AMBULATORY_CARE_PROVIDER_SITE_OTHER): Payer: 59 | Admitting: Psychology

## 2023-01-05 DIAGNOSIS — F4321 Adjustment disorder with depressed mood: Secondary | ICD-10-CM

## 2023-01-05 NOTE — Progress Notes (Signed)
[facility] Counselor/Therapist Progress Note  Patient ID: Sherry Marquez, MRN: [id number],    Date: 01/05/2023  Time Spent: 10:00am - 10:50am   50 minutes   Treatment Type: Individual Therapy  Reported Symptoms: stress  Mental Status Exam: Appearance:  Casual     Behavior: Appropriate  Motor: Normal  Speech/Language:  Normal Rate  Affect: Appropriate  Mood: normal  Thought process: normal  Thought content:   WNL  Sensory/Perceptual disturbances:   WNL  Orientation: oriented to person, place, time/date, and situation  Attention: Good  Concentration: Good  Memory: WNL  Fund of knowledge:  Good  Insight:   Good  Judgment:  Good  Impulse Control: Good   Risk Assessment: Danger to Self:  No Self-injurious Behavior: No Danger to Others: No Duty to Warn:no Physical Aggression / Violence:No  Access to Firearms a concern: No  Gang Involvement:No   Subjective: Pt present for face-to-face individual therapy via video.  Pt consents to telehealth video session due to COVID 19 pandemic. Location of pt: home Location of therapist: home office.   Pt talked about planning to go to her cousin's memorial service this weekend.   It will be a long trip that pt and family will take with her parents.  Pt feels stress about the trip.   Pt talked about her husband [name] who is still struggling emotionally bc of work stress.  Pt states she has trouble trusting her husband's reactions to things.   She tends to expect the worst case scenarios.  Helped pt process her feelings and relationship dynamics.   Pt talked about her weight and nutrition goals.   Worked on self care strategies.  Provided supportive therapy.  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  Treatment Plan(treatment plan target date 06/29/2023) Client Abilities/Strengths  Pt is bright, engaging, and motivated for therapy.    Client Treatment Preferences  Individual therapy.  Client Statement of Needs  Improve coping skills.  Symptoms  Depressed or irritable mood. Low self-esteem. Unresolved grief issues.   Problems Addressed  Unipolar Depression Goals 1. Alleviate depressive symptoms and return to previous level of effective functioning. 2. Appropriately grieve the loss in order to normalize mood and to return to previously adaptive level of functioning. Objective Learn and implement behavioral strategies to overcome depression. Target Date: 2023-06-29 Frequency: Biweekly  Progress: 40 Modality: individual  Related Interventions Engage the client in "behavioral activation," increasing his/her activity level and contact with sources of reward, while identifying processes that inhibit activation.  Use behavioral techniques such as instruction, rehearsal, role-playing, role reversal, as needed, to facilitate activity in the client's daily life; reinforce success. Assist the client in developing skills that increase the likelihood of deriving pleasure from behavioral activation (e.g., assertiveness skills, developing an exercise plan, less internal/more external focus, increased social involvement); reinforce success. Objective Identify important people in life, past and present, and describe the quality, good and poor, of those relationships. Target Date: 2023-06-29 Frequency: Biweekly  Progress: 40 Modality: individual  Related Interventions Conduct Interpersonal Therapy beginning with the assessment of the client's "interpersonal inventory" of important past and present relationships; develop a case formulation linking depression to grief, interpersonal role disputes, role transitions, and/or interpersonal deficits). Objective Learn and implement problem-solving and decision-making skills. Target Date: 2023-06-29 Frequency: Biweekly  Progress: 40 Modality: individual  Related Interventions Conduct  Problem-Solving Therapy using techniques such as psychoeducation, modeling, and role-playing to teach client  problem-solving skills (i.e., defining a problem specifically, generating possible solutions, evaluating the pros and cons of each solution, selecting and implementing a plan of action, evaluating the efficacy of the plan, accepting or revising the plan); role-play application of the problem-solving skill to a real life issue. Encourage in the client the development of a positive problem orientation in which problems and solving them are viewed as a natural part of life and not something to be feared, despaired, or avoided. 3. Develop healthy interpersonal relationships that lead to the alleviation and help prevent the relapse of depression. 4. Develop healthy thinking patterns and beliefs about self, others, and the world that lead to the alleviation and help prevent the relapse of depression. 5. Recognize, accept, and cope with feelings of depression. Diagnosis F43.21 Conditions For Discharge Achievement of treatment goals and objectives   Salomon Fick, LCSW

## 2023-01-17 ENCOUNTER — Other Ambulatory Visit (INDEPENDENT_AMBULATORY_CARE_PROVIDER_SITE_OTHER): Payer: Self-pay | Admitting: Family Medicine

## 2023-01-17 DIAGNOSIS — F3289 Other specified depressive episodes: Secondary | ICD-10-CM

## 2023-01-23 ENCOUNTER — Ambulatory Visit (INDEPENDENT_AMBULATORY_CARE_PROVIDER_SITE_OTHER): Payer: 59 | Admitting: Psychology

## 2023-01-23 DIAGNOSIS — F4321 Adjustment disorder with depressed mood: Secondary | ICD-10-CM | POA: Diagnosis not present

## 2023-01-23 NOTE — Progress Notes (Signed)
[facility] Counselor/Therapist Progress Note  Patient ID: Sherry Marquez, MRN: [id number],    Date: 01/23/2023  Time Spent: 10:00am - 10:55am   55 minutes   Treatment Type: Individual Therapy  Reported Symptoms: stress  Mental Status Exam: Appearance:  Casual     Behavior: Appropriate  Motor: Normal  Speech/Language:  Normal Rate  Affect: Appropriate  Mood: normal  Thought process: normal  Thought content:   WNL  Sensory/Perceptual disturbances:   WNL  Orientation: oriented to person, place, time/date, and situation  Attention: Good  Concentration: Good  Memory: WNL  Fund of knowledge:  Good  Insight:   Good  Judgment:  Good  Impulse Control: Good   Risk Assessment: Danger to Self:  No Self-injurious Behavior: No Danger to Others: No Duty to Warn:no Physical Aggression / Violence:No  Access to Firearms a concern: No  Gang Involvement:No    Subjective: Pt present for face-to-face individual therapy via video.  Pt consents to telehealth video session and is aware of limitations of video visits. Location of pt: home Location of therapist: home office.   Pt talked about how the summer is going with her kids home for the summer.   She is trying to mobilize them to do productive things during the day instead of just being on their electronics.   Pt talked about the trip to go to the memorial service for her cousin.  Pt was tearful as she talked about missing him.  Helped pt process her feelings and grief.   Pt talked about her husband [name] who is still struggling emotionally bc of work stress. They have had some stressful interactions bc of [name]'s angry reactions.   Helped pt process her feelings and relationship dynamics.   Pt talked about worrying a lot about possible worst case scenarios.  Pt states her mother was a Product/process development scientist and she thinks that impacted her.   Worked on worry management and thought reframing.  Pt talked about her friends moving away.  Pt is  going to miss her bc they walked together weekly.    Worked on self care strategies.  Provided supportive therapy.  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  Treatment Plan(treatment plan target date 06/29/2023) Client Abilities/Strengths  Pt is bright, engaging, and motivated for therapy.   Client Treatment Preferences  Individual therapy.  Client Statement of Needs  Improve coping skills.  Symptoms  Depressed or irritable mood. Low self-esteem. Unresolved grief issues.   Problems Addressed  Unipolar Depression Goals 1. Alleviate depressive symptoms and return to previous level of effective functioning. 2. Appropriately grieve the loss in order to normalize mood and to return to previously adaptive level of functioning. Objective Learn and implement behavioral strategies to overcome depression. Target Date: 2023-06-29 Frequency: Biweekly  Progress: 40 Modality: individual  Related Interventions Engage the client in "behavioral activation," increasing his/her activity level and contact with sources of reward, while identifying processes that inhibit activation.  Use behavioral techniques such as instruction, rehearsal, role-playing, role reversal, as needed, to facilitate activity in the client's daily life; reinforce success. Assist the client in developing skills that increase the likelihood of deriving pleasure from behavioral activation (e.g., assertiveness skills, developing an exercise plan, less internal/more external focus, increased social involvement); reinforce success. Objective Identify important people in life, past and present, and describe the quality, good and poor, of those relationships. Target Date: 2023-06-29 Frequency: Biweekly  Progress: 40  Modality: individual  Related Interventions Conduct Interpersonal Therapy beginning with the assessment of the client's  "interpersonal inventory" of important past and present relationships; develop a case formulation linking depression to grief, interpersonal role disputes, role transitions, and/or interpersonal deficits). Objective Learn and implement problem-solving and decision-making skills. Target Date: 2023-06-29 Frequency: Biweekly  Progress: 40 Modality: individual  Related Interventions Conduct Problem-Solving Therapy using techniques such as psychoeducation, modeling, and role-playing to teach client problem-solving skills (i.e., defining a problem specifically, generating possible solutions, evaluating the pros and cons of each solution, selecting and implementing a plan of action, evaluating the efficacy of the plan, accepting or revising the plan); role-play application of the problem-solving skill to a real life issue. Encourage in the client the development of a positive problem orientation in which problems and solving them are viewed as a natural part of life and not something to be feared, despaired, or avoided. 3. Develop healthy interpersonal relationships that lead to the alleviation and help prevent the relapse of depression. 4. Develop healthy thinking patterns and beliefs about self, others, and the world that lead to the alleviation and help prevent the relapse of depression. 5. Recognize, accept, and cope with feelings of depression. Diagnosis F43.21 Conditions For Discharge Achievement of treatment goals and objectives   Salomon Fick, LCSW

## 2023-01-24 ENCOUNTER — Ambulatory Visit (INDEPENDENT_AMBULATORY_CARE_PROVIDER_SITE_OTHER): Payer: 59 | Admitting: Family Medicine

## 2023-01-24 ENCOUNTER — Encounter (INDEPENDENT_AMBULATORY_CARE_PROVIDER_SITE_OTHER): Payer: Self-pay | Admitting: Family Medicine

## 2023-01-24 VITALS — BP 121/55 | HR 61 | Temp 98.0°F | Ht 66.0 in | Wt 213.0 lb

## 2023-01-24 DIAGNOSIS — E669 Obesity, unspecified: Secondary | ICD-10-CM | POA: Diagnosis not present

## 2023-01-24 DIAGNOSIS — E559 Vitamin D deficiency, unspecified: Secondary | ICD-10-CM | POA: Diagnosis not present

## 2023-01-24 DIAGNOSIS — M7711 Lateral epicondylitis, right elbow: Secondary | ICD-10-CM

## 2023-01-24 DIAGNOSIS — F3289 Other specified depressive episodes: Secondary | ICD-10-CM

## 2023-01-24 DIAGNOSIS — Z6834 Body mass index (BMI) 34.0-34.9, adult: Secondary | ICD-10-CM

## 2023-01-24 MED ORDER — BUPROPION HCL ER (SR) 200 MG PO TB12: 200.0000 mg | ORAL_TABLET | Freq: Two times a day (BID) | ORAL | 0 refills | Status: DC

## 2023-01-25 NOTE — Progress Notes (Unsigned)
Chief Complaint:   OBESITY Sherry Marquez is here to discuss her progress with her obesity treatment plan along with follow-up of her obesity related diagnoses. Sherry Marquez is on the Category 3 Plan and states she is following her eating plan approximately 65% of the time. Sherry Marquez states she is walking and lifting weights for 30-40 minutes 6 times per week.  Today's visit was #: 28 Starting weight: 232 lbs Starting date: 04/28/2021 Today's weight: 213 lbs Today's date: 01/24/2023 Total lbs lost to date: 19 Total lbs lost since last in-office visit: 1  Interim History: Patient has been traveling and going to her cousin's funeral.  She has not been able to be as strict with her eating plan, but she has still been mindful.  She was able to get back on track with her eating plan.  Subjective:   1. Vitamin D deficiency Patient is on vitamin D, and she denies nausea, vomiting, or muscle weakness.  She is working on strengthening exercises which should improve her bone density.  2. Lateral epicondylitis of right elbow Patient notes increased pain in her left bilateral elbow, pain is worse with resistance against external rotation.  She has been increasing her strengthening exercises.  3. Emotional Eating Behavior Patient is doing well with decreasing emotional eating behavior.  She has had extra challenges but she has remained mindful.  Assessment/Plan:   1. Vitamin D deficiency Patient will continue vitamin D, and we will recheck labs in 1 to 2 months.  2. Lateral epicondylitis of right elbow Patient was advised to modify her upper body exercises to decrease strain, and if pain still persists in 4 weeks we will refer her to sports medicine.  3. Emotional Eating Behavior We will refill Wellbutrin SR for 1 month.  Patient will continue to be mindful.  - buPROPion (WELLBUTRIN SR) 200 MG 12 hr tablet; Take 1 tablet (200 mg total) by mouth 2 (two) times daily.  Dispense: 60 tablet; Refill: 0  4. BMI  34.0-34.9,adult  5. Obesity, Beginning BMI 37.45 Sherry Marquez is currently in the action stage of change. As such, her goal is to continue with weight loss efforts. She has agreed to the Category 3 Plan.   Exercise goals: As is.   Behavioral modification strategies: increasing lean protein intake and emotional eating strategies.  Sherry Marquez has agreed to follow-up with our clinic in 4 weeks. She was informed of the importance of frequent follow-up visits to maximize her success with intensive lifestyle modifications for her multiple health conditions.   Objective:   Blood pressure (!) 121/55, pulse 61, temperature 98 F (36.7 C), height 5\' 6"  (1.676 m), weight 213 lb (96.6 kg), SpO2 95 %. Body mass index is 34.38 kg/m.  Lab Results  Component Value Date   CREATININE 0.88 09/13/2022   BUN 21 09/13/2022   NA 141 09/13/2022   K 4.6 09/13/2022   CL 102 09/13/2022   CO2 25 09/13/2022   Lab Results  Component Value Date   ALT 19 09/13/2022   AST 19 09/13/2022   ALKPHOS 113 09/13/2022   BILITOT 0.3 09/13/2022   Lab Results  Component Value Date   HGBA1C 6.0 (H) 09/13/2022   HGBA1C 5.5 09/20/2021   HGBA1C 5.7 02/24/2021   HGBA1C 5.7 10/29/2013   Lab Results  Component Value Date   INSULIN 10.1 09/13/2022   INSULIN 9.4 09/20/2021   INSULIN 12.2 04/28/2021   Lab Results  Component Value Date   TSH 2.440 09/13/2022   Lab  Results  Component Value Date   CHOL 249 (H) 09/13/2022   HDL 56 09/13/2022   LDLCALC 182 (H) 09/13/2022   TRIG 65 09/13/2022   CHOLHDL 4 02/24/2021   Lab Results  Component Value Date   VD25OH 32.9 09/13/2022   VD25OH 30.6 02/28/2022   VD25OH 42.9 09/20/2021   Lab Results  Component Value Date   WBC 4.6 02/24/2021   HGB 15.0 02/24/2021   HCT 44.0 02/24/2021   MCV 89.8 02/24/2021   PLT 241.0 02/24/2021   Lab Results  Component Value Date   FERRITIN 58 09/05/2020   Attestation Statements:   Reviewed by clinician on day of visit: allergies,  medications, problem list, medical history, surgical history, family history, social history, and previous encounter notes.   I, Burt Knack, am acting as transcriptionist for Quillian Quince, MD.  I have reviewed the above documentation for accuracy and completeness, and I agree with the above. -  Quillian Quince, MD

## 2023-01-26 NOTE — Addendum Note (Signed)
Addended by: Quillian Quince D on: 01/26/2023 07:26 AM   Modules accepted: Level of Service

## 2023-02-06 ENCOUNTER — Ambulatory Visit (INDEPENDENT_AMBULATORY_CARE_PROVIDER_SITE_OTHER): Payer: 59 | Admitting: Psychology

## 2023-02-06 DIAGNOSIS — F4321 Adjustment disorder with depressed mood: Secondary | ICD-10-CM

## 2023-02-06 NOTE — Progress Notes (Signed)
[facility] Counselor/Therapist Progress Note  Patient ID: Sherry Marquez, MRN: [id number],    Date: 02/06/2023  Time Spent: 10:00am - 10:55am   55 minutes   Treatment Type: Individual Therapy  Reported Symptoms: stress  Mental Status Exam: Appearance:  Casual     Behavior: Appropriate  Motor: Normal  Speech/Language:  Normal Rate  Affect: Appropriate  Mood: normal  Thought process: normal  Thought content:   WNL  Sensory/Perceptual disturbances:   WNL  Orientation: oriented to person, place, time/date, and situation  Attention: Good  Concentration: Good  Memory: WNL  Fund of knowledge:  Good  Insight:   Good  Judgment:  Good  Impulse Control: Good   Risk Assessment: Danger to Self:  No Self-injurious Behavior: No Danger to Others: No Duty to Warn:no Physical Aggression / Violence:No  Access to Firearms a concern: No  Gang Involvement:No    Subjective: Pt present for face-to-face individual therapy via video.  Pt consents to telehealth video session and is aware of limitations of video visits. Location of pt: home Location of therapist: home office.   Pt talked about  doing some home projects that she has not been able to finish for a couple of years.   It feels good to her to get those things done. Pt also got her dog in doggie day care which is a good self care arrangement for her.   Pt talked about her marriage.   Pt feels like their communication is off the pat few weeks.   Addressed the interactions and worked on relationship dynamics and communication strategies.   Pt states she has been trying to increase her self care by reading more and exercising.   Pt states she is struggling with her weight loss program.   Addressed the barriers to her weight loss goals. Pt talked about continuing to grieve for her cousin who recently passed away.  Helped pt process her feelings and grief.   Worked on additional self care strategies.  Provided supportive  therapy.  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  Treatment Plan(treatment plan target date 06/29/2023) Client Abilities/Strengths  Pt is bright, engaging, and motivated for therapy.   Client Treatment Preferences  Individual therapy.  Client Statement of Needs  Improve coping skills.  Symptoms  Depressed or irritable mood. Low self-esteem. Unresolved grief issues.   Problems Addressed  Unipolar Depression Goals 1. Alleviate depressive symptoms and return to previous level of effective functioning. 2. Appropriately grieve the loss in order to normalize mood and to return to previously adaptive level of functioning. Objective Learn and implement behavioral strategies to overcome depression. Target Date: 2023-06-29 Frequency: Biweekly  Progress: 40 Modality: individual  Related Interventions Engage the client in "behavioral activation," increasing his/her activity level and contact with sources of reward, while identifying processes that inhibit activation.  Use behavioral techniques such as instruction, rehearsal, role-playing, role reversal, as needed, to facilitate activity in the client's daily life; reinforce success. Assist the client in developing skills that increase the likelihood of deriving pleasure from behavioral activation (e.g., assertiveness skills, developing an exercise plan, less internal/more external focus, increased social involvement); reinforce success. Objective Identify important people in life, past and present, and describe the quality, good and poor, of those relationships. Target Date: 2023-06-29 Frequency: Biweekly  Progress: 40 Modality: individual  Related Interventions Conduct Interpersonal Therapy beginning with the assessment of the client's "interpersonal inventory" of important past  and present relationships; develop a case formulation linking depression  to grief, interpersonal role disputes, role transitions, and/or interpersonal deficits). Objective Learn and implement problem-solving and decision-making skills. Target Date: 2023-06-29 Frequency: Biweekly  Progress: 40 Modality: individual  Related Interventions Conduct Problem-Solving Therapy using techniques such as psychoeducation, modeling, and role-playing to teach client problem-solving skills (i.e., defining a problem specifically, generating possible solutions, evaluating the pros and cons of each solution, selecting and implementing a plan of action, evaluating the efficacy of the plan, accepting or revising the plan); role-play application of the problem-solving skill to a real life issue. Encourage in the client the development of a positive problem orientation in which problems and solving them are viewed as a natural part of life and not something to be feared, despaired, or avoided. 3. Develop healthy interpersonal relationships that lead to the alleviation and help prevent the relapse of depression. 4. Develop healthy thinking patterns and beliefs about self, others, and the world that lead to the alleviation and help prevent the relapse of depression. 5. Recognize, accept, and cope with feelings of depression. Diagnosis F43.21 Conditions For Discharge Achievement of treatment goals and objectives   Salomon Fick, LCSW

## 2023-02-19 ENCOUNTER — Other Ambulatory Visit (INDEPENDENT_AMBULATORY_CARE_PROVIDER_SITE_OTHER): Payer: Self-pay | Admitting: Family Medicine

## 2023-02-19 DIAGNOSIS — E559 Vitamin D deficiency, unspecified: Secondary | ICD-10-CM

## 2023-02-21 ENCOUNTER — Other Ambulatory Visit (INDEPENDENT_AMBULATORY_CARE_PROVIDER_SITE_OTHER): Payer: Self-pay | Admitting: Family Medicine

## 2023-02-21 ENCOUNTER — Encounter (INDEPENDENT_AMBULATORY_CARE_PROVIDER_SITE_OTHER): Payer: Self-pay | Admitting: Family Medicine

## 2023-02-21 ENCOUNTER — Ambulatory Visit (INDEPENDENT_AMBULATORY_CARE_PROVIDER_SITE_OTHER): Payer: 59 | Admitting: Family Medicine

## 2023-02-21 VITALS — BP 129/84 | HR 57 | Temp 97.9°F | Ht 66.0 in | Wt 215.0 lb

## 2023-02-21 DIAGNOSIS — F3289 Other specified depressive episodes: Secondary | ICD-10-CM

## 2023-02-21 DIAGNOSIS — Z6834 Body mass index (BMI) 34.0-34.9, adult: Secondary | ICD-10-CM

## 2023-02-21 DIAGNOSIS — E559 Vitamin D deficiency, unspecified: Secondary | ICD-10-CM | POA: Diagnosis not present

## 2023-02-21 DIAGNOSIS — E669 Obesity, unspecified: Secondary | ICD-10-CM

## 2023-02-21 MED ORDER — BUPROPION HCL ER (SR) 200 MG PO TB12
200.0000 mg | ORAL_TABLET | Freq: Two times a day (BID) | ORAL | 0 refills | Status: DC
Start: 2023-02-21 — End: 2023-03-21

## 2023-02-21 MED ORDER — VITAMIN D (ERGOCALCIFEROL) 1.25 MG (50000 UNIT) PO CAPS: 50000.0000 [IU] | ORAL_CAPSULE | ORAL | 0 refills | Status: DC

## 2023-02-22 NOTE — Progress Notes (Signed)
Chief Complaint:   OBESITY Sherry Marquez is here to discuss her progress with her obesity treatment plan along with follow-up of her obesity related diagnoses. Sherry Marquez is on the Category 3 Plan and states she is following her eating plan approximately 70% of the time. Sherry Marquez states she is walking for 20 minutes and lifting weights for 40 minutes 2-7 times per week.  Today's visit was #: 29 Starting weight: 232 lbs Starting date: 04/28/2021 Today's weight: 215 lbs Today's date: 02/21/2023 Total lbs lost to date: 17 Total lbs lost since last in-office visit: 0  Interim History: Patient has done more celebration eating since her last visit. She notes struggling to get back to her Category 3 plan and she she has not done as well with meal planning lately. She is working on getting back on track.   Subjective:   1. Vitamin D deficiency Patient's recent Vitamin D level was below goal. She requests a vitamin D refill.   2. Emotional Eating Behavior Patient is not having any problems with her Wellbutrin. She has not been as mindful with her meal plan but she is working on getting back on track.   Assessment/Plan:   1. Vitamin D deficiency We will refill prescription Vitamin D for 1 month, and we will recheck labs in 1-2 months.   - Vitamin D, Ergocalciferol, (DRISDOL) 1.25 MG (50000 UNIT) CAPS capsule; Take 1 capsule (50,000 Units total) by mouth every 7 (seven) days.  Dispense: 4 capsule; Refill: 0  2. Emotional Eating Behavior We will refill Wellbutrin SR for 1 month. Emotional eating behavior strategies were discussed.   - buPROPion (WELLBUTRIN SR) 200 MG 12 hr tablet; Take 1 tablet (200 mg total) by mouth 2 (two) times daily.  Dispense: 60 tablet; Refill: 0  3. BMI 34.0-34.9,adult  4. Obesity, Beginning BMI 37.45 Sherry Marquez is currently in the action stage of change. As such, her goal is to continue with weight loss efforts. She has agreed to the Category 3 Plan and keeping a food journal and  adhering to recommended goals of 400-600 calories and 40+ grams of protein at supper daily.   Exercise goals: As is.   Behavioral modification strategies: meal planning and cooking strategies.  Sherry Marquez has agreed to follow-up with our clinic in 4 weeks. She was informed of the importance of frequent follow-up visits to maximize her success with intensive lifestyle modifications for her multiple health conditions.   Objective:   Blood pressure 129/84, pulse (!) 57, temperature 97.9 F (36.6 C), height 5\' 6"  (1.676 m), weight 215 lb (97.5 kg), SpO2 98%. Body mass index is 34.7 kg/m.  Lab Results  Component Value Date   CREATININE 0.88 09/13/2022   BUN 21 09/13/2022   NA 141 09/13/2022   K 4.6 09/13/2022   CL 102 09/13/2022   CO2 25 09/13/2022   Lab Results  Component Value Date   ALT 19 09/13/2022   AST 19 09/13/2022   ALKPHOS 113 09/13/2022   BILITOT 0.3 09/13/2022   Lab Results  Component Value Date   HGBA1C 6.0 (H) 09/13/2022   HGBA1C 5.5 09/20/2021   HGBA1C 5.7 02/24/2021   HGBA1C 5.7 10/29/2013   Lab Results  Component Value Date   INSULIN 10.1 09/13/2022   INSULIN 9.4 09/20/2021   INSULIN 12.2 04/28/2021   Lab Results  Component Value Date   TSH 2.440 09/13/2022   Lab Results  Component Value Date   CHOL 249 (H) 09/13/2022   HDL 56 09/13/2022  LDLCALC 182 (H) 09/13/2022   TRIG 65 09/13/2022   CHOLHDL 4 02/24/2021   Lab Results  Component Value Date   VD25OH 32.9 09/13/2022   VD25OH 30.6 02/28/2022   VD25OH 42.9 09/20/2021   Lab Results  Component Value Date   WBC 4.6 02/24/2021   HGB 15.0 02/24/2021   HCT 44.0 02/24/2021   MCV 89.8 02/24/2021   PLT 241.0 02/24/2021   Lab Results  Component Value Date   FERRITIN 58 09/05/2020   Attestation Statements:   Reviewed by clinician on day of visit: allergies, medications, problem list, medical history, surgical history, family history, social history, and previous encounter notes.   I, Burt Knack, am acting as transcriptionist for Quillian Quince, MD.  I have reviewed the above documentation for accuracy and completeness, and I agree with the above. -  Quillian Quince, MD

## 2023-02-28 ENCOUNTER — Ambulatory Visit (INDEPENDENT_AMBULATORY_CARE_PROVIDER_SITE_OTHER): Payer: 59 | Admitting: Psychology

## 2023-02-28 DIAGNOSIS — F4321 Adjustment disorder with depressed mood: Secondary | ICD-10-CM | POA: Diagnosis not present

## 2023-02-28 NOTE — Progress Notes (Signed)
[facility] Counselor/Therapist Progress Note  Patient ID: Sherry Marquez, MRN: [id number],    Date: 02/28/2023  Time Spent: 10:00am - 10:55am   55 minutes   Treatment Type: Individual Therapy  Reported Symptoms: stress  Mental Status Exam: Appearance:  Casual     Behavior: Appropriate  Motor: Normal  Speech/Language:  Normal Rate  Affect: Appropriate  Mood: normal  Thought process: normal  Thought content:   WNL  Sensory/Perceptual disturbances:   WNL  Orientation: oriented to person, place, time/date, and situation  Attention: Good  Concentration: Good  Memory: WNL  Fund of knowledge:  Good  Insight:   Good  Judgment:  Good  Impulse Control: Good   Risk Assessment: Danger to Self:  No Self-injurious Behavior: No Danger to Others: No Duty to Warn:no Physical Aggression / Violence:No  Access to Firearms a concern: No  Gang Involvement:No    Subjective: Pt present for face-to-face individual therapy via video.  Pt consents to telehealth video session and is aware of limitations of video visits. Location of pt: home Location of therapist: home office.   Pt talked about spending time with family at her parents' house.  The visit went well.   Pt has been working on deep cleaning her house and trying to organize.  Pt is working on improving her motivation.   Pt talked about her health and weight loss goals.  She has not been consistent with healthy eating.   Worked on processing the barriers to pt making healthy choices.  Addressed how she could enlist her husband's help at being an accountability partner.   Worked on additional self care strategies.  Provided supportive therapy.  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  Treatment Plan(treatment plan target date 06/29/2023) Client Abilities/Strengths  Pt is bright, engaging, and motivated for therapy.   Client  Treatment Preferences  Individual therapy.  Client Statement of Needs  Improve coping skills.  Symptoms  Depressed or irritable mood. Low self-esteem. Unresolved grief issues.   Problems Addressed  Unipolar Depression Goals 1. Alleviate depressive symptoms and return to previous level of effective functioning. 2. Appropriately grieve the loss in order to normalize mood and to return to previously adaptive level of functioning. Objective Learn and implement behavioral strategies to overcome depression. Target Date: 2023-06-29 Frequency: Biweekly  Progress: 40 Modality: individual  Related Interventions Engage the client in "behavioral activation," increasing his/her activity level and contact with sources of reward, while identifying processes that inhibit activation.  Use behavioral techniques such as instruction, rehearsal, role-playing, role reversal, as needed, to facilitate activity in the client's daily life; reinforce success. Assist the client in developing skills that increase the likelihood of deriving pleasure from behavioral activation (e.g., assertiveness skills, developing an exercise plan, less internal/more external focus, increased social involvement); reinforce success. Objective Identify important people in life, past and present, and describe the quality, good and poor, of those relationships. Target Date: 2023-06-29 Frequency: Biweekly  Progress: 40 Modality: individual  Related Interventions Conduct Interpersonal Therapy beginning with the assessment of the client's "interpersonal inventory" of important past and present relationships; develop a case formulation linking depression to grief, interpersonal role disputes, role transitions, and/or interpersonal deficits). Objective Learn and implement problem-solving and decision-making skills. Target Date: 2023-06-29 Frequency: Biweekly  Progress: 40 Modality: individual  Related Interventions Conduct  Problem-Solving Therapy using techniques such as psychoeducation, modeling, and role-playing to teach client problem-solving skills (i.e., defining a  problem specifically, generating possible solutions, evaluating the pros and cons of each solution, selecting and implementing a plan of action, evaluating the efficacy of the plan, accepting or revising the plan); role-play application of the problem-solving skill to a real life issue. Encourage in the client the development of a positive problem orientation in which problems and solving them are viewed as a natural part of life and not something to be feared, despaired, or avoided. 3. Develop healthy interpersonal relationships that lead to the alleviation and help prevent the relapse of depression. 4. Develop healthy thinking patterns and beliefs about self, others, and the world that lead to the alleviation and help prevent the relapse of depression. 5. Recognize, accept, and cope with feelings of depression. Diagnosis F43.21 Conditions For Discharge Achievement of treatment goals and objectives   Salomon Fick, LCSW

## 2023-03-14 ENCOUNTER — Ambulatory Visit (INDEPENDENT_AMBULATORY_CARE_PROVIDER_SITE_OTHER): Payer: 59 | Admitting: Psychology

## 2023-03-14 DIAGNOSIS — F4321 Adjustment disorder with depressed mood: Secondary | ICD-10-CM

## 2023-03-14 NOTE — Progress Notes (Signed)
[facility] Counselor/Therapist Progress Note  Patient ID: ZADA HULTIN, MRN: [id number],    Date: 03/14/2023  Time Spent: 10:00am - 10:55am   55 minutes   Treatment Type: Individual Therapy  Reported Symptoms: stress  Mental Status Exam: Appearance:  Casual     Behavior: Appropriate  Motor: Normal  Speech/Language:  Normal Rate  Affect: Appropriate  Mood: normal  Thought process: normal  Thought content:   WNL  Sensory/Perceptual disturbances:   WNL  Orientation: oriented to person, place, time/date, and situation  Attention: Good  Concentration: Good  Memory: WNL  Fund of knowledge:  Good  Insight:   Good  Judgment:  Good  Impulse Control: Good   Risk Assessment: Danger to Self:  No Self-injurious Behavior: No Danger to Others: No Duty to Warn:no Physical Aggression / Violence:No  Access to Firearms a concern: No  Gang Involvement:No    Subjective: Pt present for face-to-face individual therapy via video.  Pt consents to telehealth video session and is aware of limitations of video visits. Location of pt: home Location of therapist: home office.   Pt talked about her son trying out for high school soccer team.  He worked hard but did not make the team.  This was disappointing for pt and her son.   Pt had a talk with her son [name] about the difficulties of the last 4 years.  Her son was tearful as he talked about his feelings.  Pt states they had not fostered open communication about feelings the past 4 years so this conversation was long overdue.   Helped pt process her thoughts and feelings.   Pt talked about feeling frustrated with her son Valentina Lucks bc he is not keeping his clothes and room picked up.   Addressed pt's frustrations.   Pt talked about planning an 81th birthday party for her mother.  She was feelings stress about the planning but spoke up to her brother about her opinions.  Pt is proud of herself for speaking up.  Pt talked about her  health and weight loss goals.  She has not been consistent with healthy eating.  She is maintaining but not losing weight.  Worked on processing the barriers to pt making healthy choices.  Worked on additional self care strategies.  Provided supportive therapy.  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  Treatment Plan(treatment plan target date 06/29/2023) Client Abilities/Strengths  Pt is bright, engaging, and motivated for therapy.   Client Treatment Preferences  Individual therapy.  Client Statement of Needs  Improve coping skills.  Symptoms  Depressed or irritable mood. Low self-esteem. Unresolved grief issues.   Problems Addressed  Unipolar Depression Goals 1. Alleviate depressive symptoms and return to previous level of effective functioning. 2. Appropriately grieve the loss in order to normalize mood and to return to previously adaptive level of functioning. Objective Learn and implement behavioral strategies to overcome depression. Target Date: 2023-06-29 Frequency: Biweekly  Progress: 40 Modality: individual  Related Interventions Engage the client in "behavioral activation," increasing his/her activity level and contact with sources of reward, while identifying processes that inhibit activation.  Use behavioral techniques such as instruction, rehearsal, role-playing, role reversal, as needed, to facilitate activity in the client's daily life; reinforce success. Assist the client in developing skills that increase the likelihood of deriving pleasure from behavioral activation (e.g., assertiveness skills, developing an exercise plan, less internal/more external focus, increased social involvement); reinforce  success. Objective Identify important people in life, past and present, and describe the quality, good and poor, of those relationships. Target Date: 2023-06-29 Frequency: Biweekly   Progress: 40 Modality: individual  Related Interventions Conduct Interpersonal Therapy beginning with the assessment of the client's "interpersonal inventory" of important past and present relationships; develop a case formulation linking depression to grief, interpersonal role disputes, role transitions, and/or interpersonal deficits). Objective Learn and implement problem-solving and decision-making skills. Target Date: 2023-06-29 Frequency: Biweekly  Progress: 40 Modality: individual  Related Interventions Conduct Problem-Solving Therapy using techniques such as psychoeducation, modeling, and role-playing to teach client problem-solving skills (i.e., defining a problem specifically, generating possible solutions, evaluating the pros and cons of each solution, selecting and implementing a plan of action, evaluating the efficacy of the plan, accepting or revising the plan); role-play application of the problem-solving skill to a real life issue. Encourage in the client the development of a positive problem orientation in which problems and solving them are viewed as a natural part of life and not something to be feared, despaired, or avoided. 3. Develop healthy interpersonal relationships that lead to the alleviation and help prevent the relapse of depression. 4. Develop healthy thinking patterns and beliefs about self, others, and the world that lead to the alleviation and help prevent the relapse of depression. 5. Recognize, accept, and cope with feelings of depression. Diagnosis F43.21 Conditions For Discharge Achievement of treatment goals and objectives   Salomon Fick, LCSW

## 2023-03-20 ENCOUNTER — Other Ambulatory Visit (INDEPENDENT_AMBULATORY_CARE_PROVIDER_SITE_OTHER): Payer: Self-pay | Admitting: Family Medicine

## 2023-03-20 DIAGNOSIS — E559 Vitamin D deficiency, unspecified: Secondary | ICD-10-CM

## 2023-03-20 DIAGNOSIS — F3289 Other specified depressive episodes: Secondary | ICD-10-CM

## 2023-03-21 ENCOUNTER — Ambulatory Visit (INDEPENDENT_AMBULATORY_CARE_PROVIDER_SITE_OTHER): Payer: 59 | Admitting: Family Medicine

## 2023-03-21 ENCOUNTER — Encounter (INDEPENDENT_AMBULATORY_CARE_PROVIDER_SITE_OTHER): Payer: Self-pay | Admitting: Family Medicine

## 2023-03-21 VITALS — BP 124/80 | HR 62 | Temp 97.6°F | Ht 66.0 in | Wt 213.0 lb

## 2023-03-21 DIAGNOSIS — R7303 Prediabetes: Secondary | ICD-10-CM

## 2023-03-21 DIAGNOSIS — Z6834 Body mass index (BMI) 34.0-34.9, adult: Secondary | ICD-10-CM

## 2023-03-21 DIAGNOSIS — E669 Obesity, unspecified: Secondary | ICD-10-CM | POA: Diagnosis not present

## 2023-03-21 DIAGNOSIS — E559 Vitamin D deficiency, unspecified: Secondary | ICD-10-CM

## 2023-03-21 DIAGNOSIS — F3289 Other specified depressive episodes: Secondary | ICD-10-CM

## 2023-03-21 MED ORDER — BUPROPION HCL ER (SR) 200 MG PO TB12
200.0000 mg | ORAL_TABLET | Freq: Two times a day (BID) | ORAL | 0 refills | Status: DC
Start: 2023-03-21 — End: 2023-04-18

## 2023-03-21 MED ORDER — VITAMIN D (ERGOCALCIFEROL) 1.25 MG (50000 UNIT) PO CAPS: 50000.0000 [IU] | ORAL_CAPSULE | ORAL | 0 refills | Status: DC

## 2023-03-22 ENCOUNTER — Other Ambulatory Visit (INDEPENDENT_AMBULATORY_CARE_PROVIDER_SITE_OTHER): Payer: Self-pay | Admitting: Family Medicine

## 2023-03-22 DIAGNOSIS — F3289 Other specified depressive episodes: Secondary | ICD-10-CM

## 2023-03-22 NOTE — Progress Notes (Signed)
Chief Complaint:   OBESITY Sherry Marquez is here to discuss her progress with her obesity treatment plan along with follow-up of her obesity related diagnoses. Sherry Marquez is on the Category 3 Plan and keeping a food journal and adhering to recommended goals of 400-600 calories and 40+ grams of protein at supper and states she is following her eating plan approximately 70% of the time. Sherry Marquez states she is walking for 30 minutes 4 times per week.  Today's visit was #: 30 Starting weight: 232 lbs Starting date: 04/28/2021 Today's weight: 213 lbs Today's date: 03/21/2023 Total lbs lost to date: 19 Total lbs lost since last in-office visit: 2  Interim History: Patient has done well with her weight loss over the last month. She still struggles with meeting her protein goals.   Subjective:   1. Prediabetes Patient's last A1c had increased to 6.0. She continues to work on her diet and weight loss to help prevent diabetes mellitus.   2. Vitamin D deficiency Patient's recent vitamin D level was at goal, and she has no signs of over replacement.  3. Emotional Eating Behavior Patient continues to have family stressors.  She is working on being mindful of her emotional eating behavior.  Assessment/Plan:   1. Prediabetes Patient declined metformin, and continue with her diet. We will recheck labs next month.   2. Vitamin D deficiency Patient will continue prescription Vitamin D once weekly, and we will refill for 1 month. We will recheck labs next month.   - Vitamin D, Ergocalciferol, (DRISDOL) 1.25 MG (50000 UNIT) CAPS capsule; Take 1 capsule (50,000 Units total) by mouth every 7 (seven) days.  Dispense: 4 capsule; Refill: 0  3. Emotional Eating Behavior Patient will continue Wellbutrin SR, and we will refill for 1 month.  - buPROPion (WELLBUTRIN SR) 200 MG 12 hr tablet; Take 1 tablet (200 mg total) by mouth 2 (two) times daily.  Dispense: 60 tablet; Refill: 0  4. BMI 34.0-34.9,adult  5.  Obesity, Beginning BMI 37.45 Sherry Marquez is currently in the action stage of change. As such, her goal is to continue with weight loss efforts. She has agreed to the Category 3 Plan and keeping a food journal and adhering to recommended goals of 400-600 calories and 40+ grams of protein at supper daily.   We will recheck fasting labs at her next visit. We discussed ways to increase protein, and meal planning should be easier when school restarts in 2 weeks.   Exercise goals: As is.   Behavioral modification strategies: increasing lean protein intake and emotional eating strategies.  Sherry Marquez has agreed to follow-up with our clinic in 4 weeks. She was informed of the importance of frequent follow-up visits to maximize her success with intensive lifestyle modifications for her multiple health conditions.   Objective:   Blood pressure 124/80, pulse 62, temperature 97.6 F (36.4 C), height 5\' 6"  (1.676 m), weight 213 lb (96.6 kg), SpO2 99%. Body mass index is 34.38 kg/m.  Lab Results  Component Value Date   CREATININE 0.88 09/13/2022   BUN 21 09/13/2022   NA 141 09/13/2022   K 4.6 09/13/2022   CL 102 09/13/2022   CO2 25 09/13/2022   Lab Results  Component Value Date   ALT 19 09/13/2022   AST 19 09/13/2022   ALKPHOS 113 09/13/2022   BILITOT 0.3 09/13/2022   Lab Results  Component Value Date   HGBA1C 6.0 (H) 09/13/2022   HGBA1C 5.5 09/20/2021   HGBA1C 5.7 02/24/2021  HGBA1C 5.7 10/29/2013   Lab Results  Component Value Date   INSULIN 10.1 09/13/2022   INSULIN 9.4 09/20/2021   INSULIN 12.2 04/28/2021   Lab Results  Component Value Date   TSH 2.440 09/13/2022   Lab Results  Component Value Date   CHOL 249 (H) 09/13/2022   HDL 56 09/13/2022   LDLCALC 182 (H) 09/13/2022   TRIG 65 09/13/2022   CHOLHDL 4 02/24/2021   Lab Results  Component Value Date   VD25OH 32.9 09/13/2022   VD25OH 30.6 02/28/2022   VD25OH 42.9 09/20/2021   Lab Results  Component Value Date   WBC 4.6  02/24/2021   HGB 15.0 02/24/2021   HCT 44.0 02/24/2021   MCV 89.8 02/24/2021   PLT 241.0 02/24/2021   Lab Results  Component Value Date   FERRITIN 58 09/05/2020   Attestation Statements:   Reviewed by clinician on day of visit: allergies, medications, problem list, medical history, surgical history, family history, social history, and previous encounter notes.   I, Burt Knack, am acting as transcriptionist for Quillian Quince, MD.  I have reviewed the above documentation for accuracy and completeness, and I agree with the above. -  Quillian Quince, MD

## 2023-03-30 ENCOUNTER — Ambulatory Visit: Payer: 59 | Admitting: Psychology

## 2023-03-30 DIAGNOSIS — F4321 Adjustment disorder with depressed mood: Secondary | ICD-10-CM | POA: Diagnosis not present

## 2023-03-30 NOTE — Progress Notes (Signed)
[facility] Counselor/Therapist Progress Note  Patient ID: Sherry Marquez, MRN: [id number],    Date: 03/30/2023  Time Spent: 10:00am - 10:50am   50 minutes   Treatment Type: Individual Therapy  Reported Symptoms: stress  Mental Status Exam: Appearance:  Casual     Behavior: Appropriate  Motor: Normal  Speech/Language:  Normal Rate  Affect: Appropriate  Mood: normal  Thought process: normal  Thought content:   WNL  Sensory/Perceptual disturbances:   WNL  Orientation: oriented to person, place, time/date, and situation  Attention: Good  Concentration: Good  Memory: WNL  Fund of knowledge:  Good  Insight:   Good  Judgment:  Good  Impulse Control: Good   Risk Assessment: Danger to Self:  No Self-injurious Behavior: No Danger to Others: No Duty to Warn:no Physical Aggression / Violence:No  Access to Firearms a concern: No  Gang Involvement:No    Subjective: Pt present for face-to-face individual therapy via video.  Pt consents to telehealth video session and is aware of limitations of video visits. Location of pt: home Location of therapist: home office.   Pt talked about working on Teacher, English as a foreign language at home.   Pt talked about her mother's 80th birthday party.   The party went well and pt and family enjoyed it.   Pt states the party preparation was not too stressful.   Pt talked about her relationship with her husband.  He was acting upset and pt was trying to second guess what was going on.  She eventually asked him and they were able to have a good conversation about the issues.  Pt talked about her health and weight loss goals.  She has been doing better with healthy eating.  She thinks keeping busy has helped her not reach for food as much. Pt's kids go back to school next week so pt is looking forward to the free time and hopes to increase her exercise.  Pt is having trouble "letting go" as her kids grow up.  She is working on releasing things that her  kids should be able to do for themselves.   Worked on additional self care strategies.  Provided supportive therapy.  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  Treatment Plan(treatment plan target date 06/29/2023) Client Abilities/Strengths  Pt is bright, engaging, and motivated for therapy.   Client Treatment Preferences  Individual therapy.  Client Statement of Needs  Improve coping skills.  Symptoms  Depressed or irritable mood. Low self-esteem. Unresolved grief issues.   Problems Addressed  Unipolar Depression Goals 1. Alleviate depressive symptoms and return to previous level of effective functioning. 2. Appropriately grieve the loss in order to normalize mood and to return to previously adaptive level of functioning. Objective Learn and implement behavioral strategies to overcome depression. Target Date: 2023-06-29 Frequency: Biweekly  Progress: 40 Modality: individual  Related Interventions Engage the client in "behavioral activation," increasing his/her activity level and contact with sources of reward, while identifying processes that inhibit activation.  Use behavioral techniques such as instruction, rehearsal, role-playing, role reversal, as needed, to facilitate activity in the client's daily life; reinforce success. Assist the client in developing skills that increase the likelihood of deriving pleasure from behavioral activation (e.g., assertiveness skills, developing an exercise plan, less internal/more external focus, increased social involvement); reinforce success. Objective Identify important people in life, past and present, and describe the quality, good and poor, of those relationships. Target Date: 2023-06-29  Frequency: Biweekly  Progress: 40 Modality: individual  Related Interventions Conduct Interpersonal Therapy beginning with the assessment of the client's  "interpersonal inventory" of important past and present relationships; develop a case formulation linking depression to grief, interpersonal role disputes, role transitions, and/or interpersonal deficits). Objective Learn and implement problem-solving and decision-making skills. Target Date: 2023-06-29 Frequency: Biweekly  Progress: 40 Modality: individual  Related Interventions Conduct Problem-Solving Therapy using techniques such as psychoeducation, modeling, and role-playing to teach client problem-solving skills (i.e., defining a problem specifically, generating possible solutions, evaluating the pros and cons of each solution, selecting and implementing a plan of action, evaluating the efficacy of the plan, accepting or revising the plan); role-play application of the problem-solving skill to a real life issue. Encourage in the client the development of a positive problem orientation in which problems and solving them are viewed as a natural part of life and not something to be feared, despaired, or avoided. 3. Develop healthy interpersonal relationships that lead to the alleviation and help prevent the relapse of depression. 4. Develop healthy thinking patterns and beliefs about self, others, and the world that lead to the alleviation and help prevent the relapse of depression. 5. Recognize, accept, and cope with feelings of depression. Diagnosis F43.21 Conditions For Discharge Achievement of treatment goals and objectives   Salomon Fick, LCSW

## 2023-04-13 ENCOUNTER — Ambulatory Visit: Payer: 59 | Admitting: Psychology

## 2023-04-15 ENCOUNTER — Other Ambulatory Visit (INDEPENDENT_AMBULATORY_CARE_PROVIDER_SITE_OTHER): Payer: Self-pay | Admitting: Family Medicine

## 2023-04-15 DIAGNOSIS — E559 Vitamin D deficiency, unspecified: Secondary | ICD-10-CM

## 2023-04-15 DIAGNOSIS — F3289 Other specified depressive episodes: Secondary | ICD-10-CM

## 2023-04-18 ENCOUNTER — Ambulatory Visit (INDEPENDENT_AMBULATORY_CARE_PROVIDER_SITE_OTHER): Payer: 59 | Admitting: Family Medicine

## 2023-04-18 ENCOUNTER — Encounter (INDEPENDENT_AMBULATORY_CARE_PROVIDER_SITE_OTHER): Payer: Self-pay | Admitting: Family Medicine

## 2023-04-18 VITALS — BP 122/80 | HR 69 | Temp 98.1°F | Ht 66.0 in | Wt 212.0 lb

## 2023-04-18 DIAGNOSIS — E7800 Pure hypercholesterolemia, unspecified: Secondary | ICD-10-CM | POA: Diagnosis not present

## 2023-04-18 DIAGNOSIS — E559 Vitamin D deficiency, unspecified: Secondary | ICD-10-CM | POA: Diagnosis not present

## 2023-04-18 DIAGNOSIS — Z6834 Body mass index (BMI) 34.0-34.9, adult: Secondary | ICD-10-CM

## 2023-04-18 DIAGNOSIS — R7303 Prediabetes: Secondary | ICD-10-CM | POA: Diagnosis not present

## 2023-04-18 DIAGNOSIS — F3289 Other specified depressive episodes: Secondary | ICD-10-CM | POA: Diagnosis not present

## 2023-04-18 DIAGNOSIS — E669 Obesity, unspecified: Secondary | ICD-10-CM

## 2023-04-18 DIAGNOSIS — R5383 Other fatigue: Secondary | ICD-10-CM

## 2023-04-18 MED ORDER — BUPROPION HCL ER (SR) 200 MG PO TB12
200.0000 mg | ORAL_TABLET | Freq: Two times a day (BID) | ORAL | 0 refills | Status: DC
Start: 2023-04-18 — End: 2023-05-16

## 2023-04-18 MED ORDER — VITAMIN D (ERGOCALCIFEROL) 1.25 MG (50000 UNIT) PO CAPS
50000.0000 [IU] | ORAL_CAPSULE | ORAL | 0 refills | Status: DC
Start: 2023-04-18 — End: 2023-05-16

## 2023-04-18 NOTE — Progress Notes (Signed)
.[facility]  Office: [phone]  /  Fax: [phone]  WEIGHT SUMMARY AND BIOMETRICS  Anthropometric Measurements Height: 5\' 6"  (1.676 m) Weight: 212 lb (96.2 kg) BMI (Calculated): 34.23 Weight at Last Visit: 213 lb Weight Lost Since Last Visit: 1 lb Weight Gained Since Last Visit: 0 Starting Weight: 232 lb Total Weight Loss (lbs): 29 lb (13.2 kg)   Body Composition  Body Fat %: 39 % Fat Mass (lbs): 82.8 lbs Muscle Mass (lbs): 123 lbs Total Body Water (lbs): 84 lbs Visceral Fat Rating : 10   Other Clinical Data Fasting: Yes Labs: Yes Today's Visit #: 31 Starting Date: 04/28/21    Chief Complaint: OBESITY   Discussed the use of AI scribe software for clinical note transcription with the patient, who gave verbal consent to proceed.  History of Present Illness   The patient is a 52 year old individual with a history of obesity, prediabetes, vitamin D insufficiency, and hyperlipidemia. She presents today for a follow-up visit and lab work. Over the past month, she has lost one pound and report adherence to the category 3 food plan approximately 75% of the time. She engages in daily physical activity, including walking and weightlifting for 20 to 30 minutes. The patient has a history of weight fluctuations, but she is currently 20 pounds lighter than she was two years ago. She expresses frustration with her weight loss progress but acknowledges the overall improvement in her health.  The patient has a history of emotional eating behaviors, currently managed with Wellbutrin. She reports feeling better with regular exercise and has noticed an increase in muscle mass. The patient also has a pet cat with diabetes, which has been causing some stress at home. The cat has been having issues with inappropriate elimination, which has been a source of frustration for the patient.  The patient also reports a history of fatigue, which she manages with magnesium supplementation. She notes a  difference in sleep quality when taking magnesium, suggesting deeper levels of sleep.  The patient has concerns about her cholesterol, particularly her LDL levels. She has been trying to manage this without the use of statins. She also has concerns about her vitamin B levels, as she often takes naps during the day.          PHYSICAL EXAM:  Blood pressure 122/80, pulse 69, temperature 98.1 F (36.7 C), height 5\' 6"  (1.676 m), weight 212 lb (96.2 kg), SpO2 96%. Body mass index is 34.22 kg/m.  DIAGNOSTIC DATA REVIEWED:  BMET    Component Value Date/Time   NA 141 09/13/2022 0930   K 4.6 09/13/2022 0930   CL 102 09/13/2022 0930   CO2 25 09/13/2022 0930   GLUCOSE 93 09/13/2022 0930   GLUCOSE 105 (H) 02/24/2021 0745   BUN 21 09/13/2022 0930   CREATININE 0.88 09/13/2022 0930   CREATININE 1.03 12/06/2019 0930   CALCIUM 9.4 09/13/2022 0930   GFRNONAA >60 09/02/2020 1309   Lab Results  Component Value Date   HGBA1C 6.0 (H) 09/13/2022   HGBA1C 5.7 10/29/2013   Lab Results  Component Value Date   INSULIN 10.1 09/13/2022   INSULIN 12.2 04/28/2021   Lab Results  Component Value Date   TSH 2.440 09/13/2022   CBC    Component Value Date/Time   WBC 4.6 02/24/2021 0745   RBC 4.89 02/24/2021 0745   HGB 15.0 02/24/2021 0745   HGB 12.7 01/31/2019 1015   HCT 44.0 02/24/2021 0745   HCT 38.0 01/31/2019 1015   PLT 241.0  02/24/2021 0745   PLT 603 (H) 01/31/2019 1015   MCV 89.8 02/24/2021 0745   MCV 91 01/31/2019 1015   MCH 30.4 09/07/2020 0850   MCHC 34.1 02/24/2021 0745   RDW 14.8 02/24/2021 0745   RDW 12.5 01/31/2019 1015   Iron Studies    Component Value Date/Time   FERRITIN 58 09/05/2020 0241   FERRITIN 265 (H) 01/31/2019 1015   Lipid Panel     Component Value Date/Time   CHOL 249 (H) 09/13/2022 0930   TRIG 65 09/13/2022 0930   HDL 56 09/13/2022 0930   CHOLHDL 4 02/24/2021 0745   VLDL 27.6 02/24/2021 0745   LDLCALC 182 (H) 09/13/2022 0930   LDLCALC 152 (H)  12/06/2019 0930   Hepatic Function Panel     Component Value Date/Time   PROT 6.6 09/13/2022 0930   ALBUMIN 4.3 09/13/2022 0930   AST 19 09/13/2022 0930   ALT 19 09/13/2022 0930   ALKPHOS 113 09/13/2022 0930   BILITOT 0.3 09/13/2022 0930      Component Value Date/Time   TSH 2.440 09/13/2022 0930   Nutritional Lab Results  Component Value Date   VD25OH 32.9 09/13/2022   VD25OH 30.6 02/28/2022   VD25OH 42.9 09/20/2021     Assessment and Plan    Obesity Patient has lost 1 pound in the last month and is following the category 3 food plan approximately 75% of the time. She is also exercising daily, including walking and weight lifting. Noted increase in muscle mass. -Continue current diet and exercise regimen.  Emotional eating behaviors Patient is currently on Wellbutrin and reports no issues with the medication. -Continue Wellbutrin as prescribed.  Prediabetes, Vitamin D insufficiency, and Pure Hyperlipidemia Patient is fasting today for labs to assess these conditions. She continues to work on diet and exercise to treat her prediabetes and HLD. -Draw labs to assess glucose, HbA1c, lipid panel, and Vitamin D levels.  Fatigue Patient reports taking naps several days a week. She is currently taking magnesium which seems to improve her sleep quality. -Check Vitamin B12 and folic acid levels to assess for possible deficiency contributing to fatigue.  Follow-up in 4 weeks to review lab results and assess progress with weight loss efforts.       She was informed of the importance of frequent follow up visits to maximize her success with intensive lifestyle modifications for her multiple health conditions.    Quillian Quince, MD

## 2023-04-19 LAB — CMP14+EGFR
ALT: 21 IU/L (ref 0–32)
AST: 18 IU/L (ref 0–40)
Albumin: 4.4 g/dL (ref 3.9–4.9)
Alkaline Phosphatase: 116 IU/L (ref 44–121)
BUN/Creatinine Ratio: 25 — ABNORMAL HIGH (ref 9–23)
BUN: 24 mg/dL (ref 6–24)
Bilirubin Total: 0.4 mg/dL (ref 0.0–1.2)
CO2: 24 mmol/L (ref 20–29)
Calcium: 9.9 mg/dL (ref 8.7–10.2)
Chloride: 101 mmol/L (ref 96–106)
Creatinine, Ser: 0.96 mg/dL (ref 0.57–1.00)
Globulin, Total: 2.7 g/dL (ref 1.5–4.5)
Glucose: 106 mg/dL — ABNORMAL HIGH (ref 70–99)
Potassium: 5.1 mmol/L (ref 3.5–5.2)
Sodium: 140 mmol/L (ref 134–144)
Total Protein: 7.1 g/dL (ref 6.0–8.5)
eGFR: 72 mL/min/{1.73_m2} (ref >59)

## 2023-04-19 LAB — HEMOGLOBIN A1C
Est. average glucose Bld gHb Est-mCnc: 117 mg/dL
Hgb A1c MFr Bld: 5.7 % — ABNORMAL HIGH (ref 4.8–5.6)

## 2023-04-19 LAB — VITAMIN D 25 HYDROXY (VIT D DEFICIENCY, FRACTURES): Vit D, 25-Hydroxy: 50.7 ng/mL (ref 30.0–100.0)

## 2023-04-19 LAB — LIPID PANEL WITH LDL/HDL RATIO
Cholesterol, Total: 249 mg/dL — ABNORMAL HIGH (ref 100–199)
HDL: 58 mg/dL (ref >39)
LDL Chol Calc (NIH): 173 mg/dL — ABNORMAL HIGH (ref 0–99)
LDL/HDL Ratio: 3 ratio (ref 0.0–3.2)
Triglycerides: 104 mg/dL (ref 0–149)
VLDL Cholesterol Cal: 18 mg/dL (ref 5–40)

## 2023-04-19 LAB — FOLATE: Folate: 13.9 ng/mL (ref >3.0)

## 2023-04-19 LAB — VITAMIN B12: Vitamin B-12: 1035 pg/mL (ref 232–1245)

## 2023-04-19 LAB — INSULIN, RANDOM: INSULIN: 8.8 u[IU]/mL (ref 2.6–24.9)

## 2023-04-27 ENCOUNTER — Ambulatory Visit (INDEPENDENT_AMBULATORY_CARE_PROVIDER_SITE_OTHER): Payer: 59 | Admitting: Psychology

## 2023-04-27 DIAGNOSIS — F4321 Adjustment disorder with depressed mood: Secondary | ICD-10-CM | POA: Diagnosis not present

## 2023-04-27 NOTE — Progress Notes (Signed)
[facility] Counselor/Therapist Progress Note  Patient ID: Sherry Marquez, MRN: [id number],    Date: 04/27/2023  Time Spent: 10:00am - 10:55am   55 minutes   Treatment Type: Individual Therapy  Reported Symptoms: stress  Mental Status Exam: Appearance:  Casual     Behavior: Appropriate  Motor: Normal  Speech/Language:  Normal Rate  Affect: Appropriate  Mood: normal  Thought process: normal  Thought content:   WNL  Sensory/Perceptual disturbances:   WNL  Orientation: oriented to person, place, time/date, and situation  Attention: Good  Concentration: Good  Memory: WNL  Fund of knowledge:  Good  Insight:   Good  Judgment:  Good  Impulse Control: Good   Risk Assessment: Danger to Self:  No Self-injurious Behavior: No Danger to Others: No Duty to Warn:no Physical Aggression / Violence:No  Access to Firearms a concern: No  Gang Involvement:No    Subjective: Pt present for face-to-face individual therapy via video.  Pt consents to telehealth video session and is aware of limitations and benefits of video sessions. Location of pt: home Location of therapist: home office.   Pt talked about having a stressful week.   Her youngest son is starting drivers ed which is stressful.   Pt feels her relationship with [name] is strained at times bc he acts like she never says the right thing.   Addressed the challenges of parenting teens.   Pt states she has had a lot of things to make decisions about and she does not feel like she did the best job with decision making.  Addressed the issues and helped pt problem solve. Pt feels like she did not follow her gut in the decisions and feels she should have.   Pt talked about her cat Tom.   He is not using the litter box to poop so pt finds poop throughout the house.   Pt is concerned about the cat and frustrated with the issues.  Elijah Birk is only 51 years old but he has diabetes and has to be given insulin twice a day.   Pt is wondering  about considering the decision to have the cat put down.   This is very upsetting for her.   Pt is feelings stressed and overwhelmed.  Pt's self critical thoughts intensify her stress.  Worked on thought reframing and self compassion.   Worked on additional self care strategies.  Provided supportive therapy.  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  Treatment Plan(treatment plan target date 06/29/2023) Client Abilities/Strengths  Pt is bright, engaging, and motivated for therapy.   Client Treatment Preferences  Individual therapy.  Client Statement of Needs  Improve coping skills.  Symptoms  Depressed or irritable mood. Low self-esteem. Unresolved grief issues.   Problems Addressed  Unipolar Depression Goals 1. Alleviate depressive symptoms and return to previous level of effective functioning. 2. Appropriately grieve the loss in order to normalize mood and to return to previously adaptive level of functioning. Objective Learn and implement behavioral strategies to overcome depression. Target Date: 2023-06-29 Frequency: Biweekly  Progress: 40 Modality: individual  Related Interventions Engage the client in "behavioral activation," increasing his/her activity level and contact with sources of reward, while identifying processes that inhibit activation.  Use behavioral techniques such as instruction, rehearsal, role-playing, role reversal, as needed, to facilitate activity in the client's daily life; reinforce success. Assist the client in developing skills that increase the likelihood of deriving  pleasure from behavioral activation (e.g., assertiveness skills, developing an exercise plan, less internal/more external focus, increased social involvement); reinforce success. Objective Identify important people in life, past and present, and describe the quality, good and poor, of those  relationships. Target Date: 2023-06-29 Frequency: Biweekly  Progress: 40 Modality: individual  Related Interventions Conduct Interpersonal Therapy beginning with the assessment of the client's "interpersonal inventory" of important past and present relationships; develop a case formulation linking depression to grief, interpersonal role disputes, role transitions, and/or interpersonal deficits). Objective Learn and implement problem-solving and decision-making skills. Target Date: 2023-06-29 Frequency: Biweekly  Progress: 40 Modality: individual  Related Interventions Conduct Problem-Solving Therapy using techniques such as psychoeducation, modeling, and role-playing to teach client problem-solving skills (i.e., defining a problem specifically, generating possible solutions, evaluating the pros and cons of each solution, selecting and implementing a plan of action, evaluating the efficacy of the plan, accepting or revising the plan); role-play application of the problem-solving skill to a real life issue. Encourage in the client the development of a positive problem orientation in which problems and solving them are viewed as a natural part of life and not something to be feared, despaired, or avoided. 3. Develop healthy interpersonal relationships that lead to the alleviation and help prevent the relapse of depression. 4. Develop healthy thinking patterns and beliefs about self, others, and the world that lead to the alleviation and help prevent the relapse of depression. 5. Recognize, accept, and cope with feelings of depression. Diagnosis F43.21 Conditions For Discharge Achievement of treatment goals and objectives   Salomon Fick, LCSW

## 2023-05-11 ENCOUNTER — Ambulatory Visit: Payer: 59 | Admitting: Psychology

## 2023-05-16 ENCOUNTER — Encounter (INDEPENDENT_AMBULATORY_CARE_PROVIDER_SITE_OTHER): Payer: Self-pay | Admitting: Family Medicine

## 2023-05-16 ENCOUNTER — Ambulatory Visit (INDEPENDENT_AMBULATORY_CARE_PROVIDER_SITE_OTHER): Payer: 59 | Admitting: Family Medicine

## 2023-05-16 VITALS — BP 137/79 | HR 66 | Temp 97.5°F | Ht 66.0 in | Wt 211.0 lb

## 2023-05-16 DIAGNOSIS — Z6834 Body mass index (BMI) 34.0-34.9, adult: Secondary | ICD-10-CM | POA: Diagnosis not present

## 2023-05-16 DIAGNOSIS — F5089 Other specified eating disorder: Secondary | ICD-10-CM | POA: Diagnosis not present

## 2023-05-16 DIAGNOSIS — F3289 Other specified depressive episodes: Secondary | ICD-10-CM

## 2023-05-16 DIAGNOSIS — E669 Obesity, unspecified: Secondary | ICD-10-CM

## 2023-05-16 DIAGNOSIS — E559 Vitamin D deficiency, unspecified: Secondary | ICD-10-CM

## 2023-05-16 MED ORDER — BUPROPION HCL ER (SR) 200 MG PO TB12
200.0000 mg | ORAL_TABLET | Freq: Two times a day (BID) | ORAL | 0 refills | Status: DC
Start: 2023-05-16 — End: 2023-06-13

## 2023-05-16 MED ORDER — VITAMIN D (ERGOCALCIFEROL) 1.25 MG (50000 UNIT) PO CAPS
50000.0000 [IU] | ORAL_CAPSULE | ORAL | 0 refills | Status: DC
Start: 2023-05-16 — End: 2023-06-13

## 2023-05-16 MED ORDER — ESCITALOPRAM OXALATE 10 MG PO TABS
10.0000 mg | ORAL_TABLET | Freq: Every day | ORAL | 0 refills | Status: DC
Start: 2023-05-16 — End: 2023-08-15

## 2023-05-16 NOTE — Progress Notes (Signed)
Chief Complaint:   OBESITY Sherry Marquez is here to discuss her progress with her obesity treatment plan along with follow-up of her obesity related diagnoses. Sherry Marquez is on the Category 3 Plan and states she is following her eating plan approximately 70% of the time. Sherry Marquez states she is walking for 20-30 minutes 7 times per week.  Today's visit was #: 32 Starting weight: 232 lbs Starting date: 04/28/2021 Today's weight: 211 lbs Today's date: 05/16/2023 Total lbs lost to date: 21 Total lbs lost since last in-office visit: 1  Interim History: Patient has had more stressors at home lately, and she has noted some increased stress eating.  She has done well with increasing her protein.  Subjective:   1. Vitamin D insufficiency Patient is stable on vitamin D, and her recent vitamin D level was at goal.  She has no signs of over replacement.  I discussed labs with the patient today.  2. Emotional Eating Behavior Patient's stress level is higher than normal and this may be affecting her emotional eating behavior.  She is stable on Wellbutrin but her anxiety has increased.  Assessment/Plan:   1. Vitamin D insufficiency Patient will continue prescription vitamin D 50,000 IU once every 7 days, and we will refill for 1 month.  - Vitamin D, Ergocalciferol, (DRISDOL) 1.25 MG (50000 UNIT) CAPS capsule; Take 1 capsule (50,000 Units total) by mouth every 7 (seven) days.  Dispense: 4 capsule; Refill: 0   2. Emotional Eating Behavior Patient will continue Wellbutrin SR 200 mg twice daily, and we will refill for 1 month.  She agreed to start Lexapro 10 mg once daily with no refills.  We will follow-up at her next visit in 1 month.  - buPROPion (WELLBUTRIN SR) 200 MG 12 hr tablet; Take 1 tablet (200 mg total) by mouth 2 (two) times daily.  Dispense: 60 tablet; Refill: 0  - escitalopram (LEXAPRO) 10 MG tablet; Take 1 tablet (10 mg total) by mouth daily.  Dispense: 30 tablet; Refill: 0  3. BMI  34.0-34.9,adult  4. Obesity, Beginning BMI 37.45 Sherry Marquez is currently in the action stage of change. As such, her goal is to continue with weight loss efforts. She has agreed to the Category 3 Plan.   Exercise goals: As is.   Behavioral modification strategies: increasing lean protein intake and emotional eating strategies.  Sherry Marquez has agreed to follow-up with our clinic in 4 weeks. She was informed of the importance of frequent follow-up visits to maximize her success with intensive lifestyle modifications for her multiple health conditions.   Objective:   Blood pressure 137/79, pulse 66, temperature (!) 97.5 F (36.4 C), height 5\' 6"  (1.676 m), weight 211 lb (95.7 kg), SpO2 98%. Body mass index is 34.06 kg/m.  Lab Results  Component Value Date   CREATININE 0.96 04/18/2023   BUN 24 04/18/2023   NA 140 04/18/2023   K 5.1 04/18/2023   CL 101 04/18/2023   CO2 24 04/18/2023   Lab Results  Component Value Date   ALT 21 04/18/2023   AST 18 04/18/2023   ALKPHOS 116 04/18/2023   BILITOT 0.4 04/18/2023   Lab Results  Component Value Date   HGBA1C 5.7 (H) 04/18/2023   HGBA1C 6.0 (H) 09/13/2022   HGBA1C 5.5 09/20/2021   HGBA1C 5.7 02/24/2021   HGBA1C 5.7 10/29/2013   Lab Results  Component Value Date   INSULIN 8.8 04/18/2023   INSULIN 10.1 09/13/2022   INSULIN 9.4 09/20/2021   INSULIN 12.2  04/28/2021   Lab Results  Component Value Date   TSH 2.440 09/13/2022   Lab Results  Component Value Date   CHOL 249 (H) 04/18/2023   HDL 58 04/18/2023   LDLCALC 173 (H) 04/18/2023   TRIG 104 04/18/2023   CHOLHDL 4 02/24/2021   Lab Results  Component Value Date   VD25OH 50.7 04/18/2023   VD25OH 32.9 09/13/2022   VD25OH 30.6 02/28/2022   Lab Results  Component Value Date   WBC 4.6 02/24/2021   HGB 15.0 02/24/2021   HCT 44.0 02/24/2021   MCV 89.8 02/24/2021   PLT 241.0 02/24/2021   Lab Results  Component Value Date   FERRITIN 58 09/05/2020   Attestation Statements:    Reviewed by clinician on day of visit: allergies, medications, problem list, medical history, surgical history, family history, social history, and previous encounter notes.   I, Burt Knack, am acting as transcriptionist for Quillian Quince, MD.  I have reviewed the above documentation for accuracy and completeness, and I agree with the above. -  Quillian Quince, MD

## 2023-05-25 ENCOUNTER — Ambulatory Visit: Payer: 59 | Admitting: Psychology

## 2023-05-25 DIAGNOSIS — F4321 Adjustment disorder with depressed mood: Secondary | ICD-10-CM | POA: Diagnosis not present

## 2023-05-25 NOTE — Progress Notes (Signed)
[facility] Counselor/Therapist Progress Note  Patient ID: Sherry Marquez, MRN: [id number],    Date: 05/25/2023  Time Spent: 10:00am - 10:55am   55 minutes   Treatment Type: Individual Therapy  Reported Symptoms: stress, anxiety  Mental Status Exam: Appearance:  Casual     Behavior: Appropriate  Motor: Normal  Speech/Language:  Normal Rate  Affect: Appropriate  Mood: normal  Thought process: normal  Thought content:   WNL  Sensory/Perceptual disturbances:   WNL  Orientation: oriented to person, place, time/date, and situation  Attention: Good  Concentration: Good  Memory: WNL  Fund of knowledge:  Good  Insight:   Good  Judgment:  Good  Impulse Control: Good   Risk Assessment: Danger to Self:  No Self-injurious Behavior: No Danger to Others: No Duty to Warn:no Physical Aggression / Violence:No  Access to Firearms a concern: No  Gang Involvement:No    Subjective: Pt present for face-to-face individual therapy via video.  Pt consents to telehealth video session and is aware of limitations and benefits of video sessions. Location of pt: home Location of therapist: home office.   Pt talked about concern about a family member who is having health problems.  Pt's family is very close so they are worried about the family member who is in the hospital.  Addressed pt's worries and concerns.   Pt talked about issues with her youngest son who is 80.  She is concerned he is not always being truthful.   Addressed how pt can parent these issues.   Pt is feelings stressed and overwhelmed.  Pt's self critical thoughts intensify her stress.  Worked on thought reframing and self compassion.   Pt states she is overeating bc of stress.   Worked on stress management and self care strategies.  Provided supportive therapy.  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  Treatment  Plan(treatment plan target date 06/29/2023) Client Abilities/Strengths  Pt is bright, engaging, and motivated for therapy.   Client Treatment Preferences  Individual therapy.  Client Statement of Needs  Improve coping skills.  Symptoms  Depressed or irritable mood. Low self-esteem. Unresolved grief issues.   Problems Addressed  Unipolar Depression Goals 1. Alleviate depressive symptoms and return to previous level of effective functioning. 2. Appropriately grieve the loss in order to normalize mood and to return to previously adaptive level of functioning. Objective Learn and implement behavioral strategies to overcome depression. Target Date: 2023-06-29 Frequency: Biweekly  Progress: 40 Modality: individual  Related Interventions Engage the client in "behavioral activation," increasing his/her activity level and contact with sources of reward, while identifying processes that inhibit activation.  Use behavioral techniques such as instruction, rehearsal, role-playing, role reversal, as needed, to facilitate activity in the client's daily life; reinforce success. Assist the client in developing skills that increase the likelihood of deriving pleasure from behavioral activation (e.g., assertiveness skills, developing an exercise plan, less internal/more external focus, increased social involvement); reinforce success. Objective Identify important people in life, past and present, and describe the quality, good and poor, of those relationships. Target Date: 2023-06-29 Frequency: Biweekly  Progress: 40 Modality: individual  Related Interventions Conduct Interpersonal Therapy beginning with the assessment of the client's "interpersonal inventory" of important past and present relationships; develop a case formulation linking depression to grief, interpersonal role disputes, role transitions, and/or interpersonal deficits). Objective Learn and implement problem-solving and decision-making  skills. Target Date: 2023-06-29 Frequency: Biweekly  Progress: 40  Modality: individual  Related Interventions Conduct Problem-Solving Therapy using techniques such as psychoeducation, modeling, and role-playing to teach client problem-solving skills (i.e., defining a problem specifically, generating possible solutions, evaluating the pros and cons of each solution, selecting and implementing a plan of action, evaluating the efficacy of the plan, accepting or revising the plan); role-play application of the problem-solving skill to a real life issue. Encourage in the client the development of a positive problem orientation in which problems and solving them are viewed as a natural part of life and not something to be feared, despaired, or avoided. 3. Develop healthy interpersonal relationships that lead to the alleviation and help prevent the relapse of depression. 4. Develop healthy thinking patterns and beliefs about self, others, and the world that lead to the alleviation and help prevent the relapse of depression. 5. Recognize, accept, and cope with feelings of depression. Diagnosis F43.21 Conditions For Discharge Achievement of treatment goals and objectives   Salomon Fick, LCSW

## 2023-06-08 ENCOUNTER — Ambulatory Visit: Payer: 59 | Admitting: Psychology

## 2023-06-13 ENCOUNTER — Ambulatory Visit (INDEPENDENT_AMBULATORY_CARE_PROVIDER_SITE_OTHER): Payer: 59 | Admitting: Family Medicine

## 2023-06-13 ENCOUNTER — Encounter (INDEPENDENT_AMBULATORY_CARE_PROVIDER_SITE_OTHER): Payer: Self-pay | Admitting: Family Medicine

## 2023-06-13 VITALS — BP 125/70 | HR 60 | Temp 97.8°F | Ht 66.0 in | Wt 214.0 lb

## 2023-06-13 DIAGNOSIS — F3289 Other specified depressive episodes: Secondary | ICD-10-CM

## 2023-06-13 DIAGNOSIS — E559 Vitamin D deficiency, unspecified: Secondary | ICD-10-CM | POA: Diagnosis not present

## 2023-06-13 DIAGNOSIS — Z6834 Body mass index (BMI) 34.0-34.9, adult: Secondary | ICD-10-CM

## 2023-06-13 DIAGNOSIS — E669 Obesity, unspecified: Secondary | ICD-10-CM | POA: Diagnosis not present

## 2023-06-13 MED ORDER — BUPROPION HCL ER (SR) 200 MG PO TB12
200.0000 mg | ORAL_TABLET | Freq: Two times a day (BID) | ORAL | 0 refills | Status: DC
Start: 2023-06-13 — End: 2023-07-18

## 2023-06-13 MED ORDER — VITAMIN D (ERGOCALCIFEROL) 1.25 MG (50000 UNIT) PO CAPS
50000.0000 [IU] | ORAL_CAPSULE | ORAL | 0 refills | Status: DC
Start: 2023-06-13 — End: 2023-07-18

## 2023-06-13 NOTE — Progress Notes (Signed)
.smr  Office: [phone]  /  Fax: [phone]  WEIGHT SUMMARY AND BIOMETRICS  Anthropometric Measurements Height: 5\' 6"  (1.676 m) Weight: 214 lb (97.1 kg) BMI (Calculated): 34.56 Weight at Last Visit: 211 lb Weight Lost Since Last Visit: 0 Weight Gained Since Last Visit: 3 lb Starting Weight: 232 lb Total Weight Loss (lbs): 27 lb (12.2 kg)   Body Composition  Body Fat %: 39.3 % Fat Mass (lbs): 84.4 lbs Muscle Mass (lbs): 123.8 lbs Total Body Water (lbs): 83.8 lbs Visceral Fat Rating : 10   Other Clinical Data Fasting: No Labs: No Today's Visit #: 39 Starting Date: 04/28/21    Chief Complaint: OBESITY   History of Present Illness   The patient, diagnosed with obesity, vitamin D deficiency, and depression with emotional eating behaviors, presents for a follow-up visit. She reports a weight gain of three pounds over the past month despite adherence to a category three eating plan approximately 60% of the time and regular physical activity, including walking for 20-30 minutes daily. The patient is currently on bupropion for depression and emotional eating behaviors and vitamin D supplementation for her deficiency. She was prescribed Lexapro at the last visit but chose not to take it.  The patient acknowledges increased snacking, attributing it to avoidance of dealing with feelings and subsequent guilt about falling off track with her diet. She also reports a decrease in meal planning, particularly for lunches and breakfasts, which she believes contributes to her snacking habits. The patient expresses frustration with the ease of being thrown off her meal planning routine, noting a recent instance when visitors disrupted her schedule.  The patient also discusses her struggle with tracking her food intake, acknowledging a tendency towards perfectionism that hinders her efforts. She mentions an interest in focusing on protein and healthy fats for satiety, based on a weight loss  plan she discovered on social media. Despite these struggles, the patient is making efforts to consume healthy foods, such as cottage cheese, yogurt, and blueberries, but admits to occasional indulgence in less healthy options like candy corn.  The patient also discusses the stress of raising a teenage son and the impact it has on her emotional eating. She is working with a therapist to manage this stress. The patient declined to start Lexapro, expressing a reluctance to add more medication to her regimen. She is open to revisiting this decision if needed in the future.          PHYSICAL EXAM:  Blood pressure 125/70, pulse 60, temperature 97.8 F (36.6 C), height 5\' 6"  (1.676 m), weight 214 lb (97.1 kg), SpO2 98%. Body mass index is 34.54 kg/m.  DIAGNOSTIC DATA REVIEWED:  BMET    Component Value Date/Time   NA 140 04/18/2023 0747   K 5.1 04/18/2023 0747   CL 101 04/18/2023 0747   CO2 24 04/18/2023 0747   GLUCOSE 106 (H) 04/18/2023 0747   GLUCOSE 105 (H) 02/24/2021 0745   BUN 24 04/18/2023 0747   CREATININE 0.96 04/18/2023 0747   CREATININE 1.03 12/06/2019 0930   CALCIUM 9.9 04/18/2023 0747   GFRNONAA >60 09/02/2020 1309   Lab Results  Component Value Date   HGBA1C 5.7 (H) 04/18/2023   HGBA1C 5.7 10/29/2013   Lab Results  Component Value Date   INSULIN 8.8 04/18/2023   INSULIN 12.2 04/28/2021   Lab Results  Component Value Date   TSH 2.440 09/13/2022   CBC    Component Value Date/Time   WBC 4.6 02/24/2021 0745  RBC 4.89 02/24/2021 0745   HGB 15.0 02/24/2021 0745   HGB 12.7 01/31/2019 1015   HCT 44.0 02/24/2021 0745   HCT 38.0 01/31/2019 1015   PLT 241.0 02/24/2021 0745   PLT 603 (H) 01/31/2019 1015   MCV 89.8 02/24/2021 0745   MCV 91 01/31/2019 1015   MCH 30.4 09/07/2020 0850   MCHC 34.1 02/24/2021 0745   RDW 14.8 02/24/2021 0745   RDW 12.5 01/31/2019 1015   Iron Studies    Component Value Date/Time   FERRITIN 58 09/05/2020 0241   FERRITIN 265 (H)  01/31/2019 1015   Lipid Panel     Component Value Date/Time   CHOL 249 (H) 04/18/2023 0747   TRIG 104 04/18/2023 0747   HDL 58 04/18/2023 0747   CHOLHDL 4 02/24/2021 0745   VLDL 27.6 02/24/2021 0745   LDLCALC 173 (H) 04/18/2023 0747   LDLCALC 152 (H) 12/06/2019 0930   Hepatic Function Panel     Component Value Date/Time   PROT 7.1 04/18/2023 0747   ALBUMIN 4.4 04/18/2023 0747   AST 18 04/18/2023 0747   ALT 21 04/18/2023 0747   ALKPHOS 116 04/18/2023 0747   BILITOT 0.4 04/18/2023 0747      Component Value Date/Time   TSH 2.440 09/13/2022 0930   Nutritional Lab Results  Component Value Date   VD25OH 50.7 04/18/2023   VD25OH 32.9 09/13/2022   VD25OH 30.6 02/28/2022     Assessment and Plan    Obesity Weight gain of 3 pounds in the last month. Adherence to category three eating plan is 60%. Emotional eating and snacking identified as contributing factors. Meal planning has decreased. -Resume meal planning and prepping. -Consider food journaling with a goal of 1500 calories and 100+ grams of protein daily or follow the category 3 eating plan -Develop holiday eating strategies. -work on minimizing all or nothing thinking -Follow up in 4 weeks.  Vitamin D deficiency Currently on prescription vitamin D and reports good adherence. -Refill prescription vitamin D.  Depression with emotional eating behaviors Currently on bupropion. Lexapro was prescribed but patient decided not to take it. -Continue bupropion. -Consider starting therapy with Lexapro if symptoms worsen.        She was informed of the importance of frequent follow up visits to maximize her success with intensive lifestyle modifications for her multiple health conditions.    Quillian Quince, MD

## 2023-06-13 NOTE — Patient Instructions (Signed)
bmi

## 2023-06-23 ENCOUNTER — Ambulatory Visit (INDEPENDENT_AMBULATORY_CARE_PROVIDER_SITE_OTHER): Payer: 59 | Admitting: Psychology

## 2023-06-23 DIAGNOSIS — F4321 Adjustment disorder with depressed mood: Secondary | ICD-10-CM

## 2023-06-23 NOTE — Progress Notes (Signed)
[facility] Counselor/Therapist Progress Note  Patient ID: Sherry Marquez, MRN: [id number],    Date: 06/23/2023  Time Spent: 11:00am - 11:55am   55 minutes   Treatment Type: Individual Therapy  Reported Symptoms: stress, anxiety  Mental Status Exam: Appearance:  Casual     Behavior: Appropriate  Motor: Normal  Speech/Language:  Normal Rate  Affect: Appropriate  Mood: normal  Thought process: normal  Thought content:   WNL  Sensory/Perceptual disturbances:   WNL  Orientation: oriented to person, place, time/date, and situation  Attention: Good  Concentration: Good  Memory: WNL  Fund of knowledge:  Good  Insight:   Good  Judgment:  Good  Impulse Control: Good   Risk Assessment: Danger to Self:  No Self-injurious Behavior: No Danger to Others: No Duty to Warn:no Physical Aggression / Violence:No  Access to Firearms a concern: No  Gang Involvement:No    Subjective: Pt present for face-to-face individual therapy via video.  Pt consents to telehealth video session and is aware of limitations and benefits of video sessions. Location of pt: home Location of therapist: home office.   Pt talked about feeling tearful today.  She feels stressed and overwhelmed but is not sure why she is feeling tearful.   Pt is stressed about her cat.  He continues to not use the litter box.  This is very upsetting to pt.  The cat is only 65 years old but has diabetes so pt has to give him twice daily insulin shots.  Pt and husband are thinking about making the difficult decision to put the cat down.     Pt talked about her son Sherry Marquez.  He is a teenager and communicating with him can be challenging.  Pt is worried about how Sherry Marquez is doing in school.  Addressed the dynamics between pt and Sherry Marquez.  Sherry Marquez does not listen to pt and she gets frustrated.  He also does not follow through with doing things he's asked to do.   Worked on parenting strategies.    Pt tends to be very hard on herself.   Worked on self compassion.   Pt feels stress that she is hardly ever alone bc her husband works from home and the kids are home when not in school.   Worked on stress management and self care strategies.  Provided supportive therapy.  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  Treatment Plan(treatment plan target date 06/29/2023) Client Abilities/Strengths  Pt is bright, engaging, and motivated for therapy.   Client Treatment Preferences  Individual therapy.  Client Statement of Needs  Improve coping skills.  Symptoms  Depressed or irritable mood. Low self-esteem. Unresolved grief issues.   Problems Addressed  Unipolar Depression Goals 1. Alleviate depressive symptoms and return to previous level of effective functioning. 2. Appropriately grieve the loss in order to normalize mood and to return to previously adaptive level of functioning. Objective Learn and implement behavioral strategies to overcome depression. Target Date: 2023-06-29 Frequency: Biweekly  Progress: 40 Modality: individual  Related Interventions Engage the client in "behavioral activation," increasing his/her activity level and contact with sources of reward, while identifying processes that inhibit activation.  Use behavioral techniques such as instruction, rehearsal, role-playing, role reversal, as needed, to facilitate activity in the client's daily life; reinforce success. Assist the client in developing skills that increase the likelihood of deriving pleasure from behavioral activation (e.g., assertiveness skills, developing an exercise plan,  less internal/more external focus, increased social involvement); reinforce success. Objective Identify important people in life, past and present, and describe the quality, good and poor, of those relationships. Target Date: 2023-06-29 Frequency: Biweekly  Progress: 40 Modality:  individual  Related Interventions Conduct Interpersonal Therapy beginning with the assessment of the client's "interpersonal inventory" of important past and present relationships; develop a case formulation linking depression to grief, interpersonal role disputes, role transitions, and/or interpersonal deficits). Objective Learn and implement problem-solving and decision-making skills. Target Date: 2023-06-29 Frequency: Biweekly  Progress: 40 Modality: individual  Related Interventions Conduct Problem-Solving Therapy using techniques such as psychoeducation, modeling, and role-playing to teach client problem-solving skills (i.e., defining a problem specifically, generating possible solutions, evaluating the pros and cons of each solution, selecting and implementing a plan of action, evaluating the efficacy of the plan, accepting or revising the plan); role-play application of the problem-solving skill to a real life issue. Encourage in the client the development of a positive problem orientation in which problems and solving them are viewed as a natural part of life and not something to be feared, despaired, or avoided. 3. Develop healthy interpersonal relationships that lead to the alleviation and help prevent the relapse of depression. 4. Develop healthy thinking patterns and beliefs about self, others, and the world that lead to the alleviation and help prevent the relapse of depression. 5. Recognize, accept, and cope with feelings of depression. Diagnosis F43.21 Conditions For Discharge Achievement of treatment goals and objectives   Salomon Fick, LCSW

## 2023-07-18 ENCOUNTER — Ambulatory Visit (INDEPENDENT_AMBULATORY_CARE_PROVIDER_SITE_OTHER): Payer: 59 | Admitting: Family Medicine

## 2023-07-18 ENCOUNTER — Encounter (INDEPENDENT_AMBULATORY_CARE_PROVIDER_SITE_OTHER): Payer: Self-pay | Admitting: Family Medicine

## 2023-07-18 VITALS — BP 136/79 | HR 70 | Temp 98.0°F | Ht 66.0 in | Wt 217.0 lb

## 2023-07-18 DIAGNOSIS — Z6837 Body mass index (BMI) 37.0-37.9, adult: Secondary | ICD-10-CM

## 2023-07-18 DIAGNOSIS — Z6835 Body mass index (BMI) 35.0-35.9, adult: Secondary | ICD-10-CM | POA: Diagnosis not present

## 2023-07-18 DIAGNOSIS — F5089 Other specified eating disorder: Secondary | ICD-10-CM | POA: Diagnosis not present

## 2023-07-18 DIAGNOSIS — E669 Obesity, unspecified: Secondary | ICD-10-CM

## 2023-07-18 DIAGNOSIS — E559 Vitamin D deficiency, unspecified: Secondary | ICD-10-CM

## 2023-07-18 DIAGNOSIS — F3289 Other specified depressive episodes: Secondary | ICD-10-CM

## 2023-07-18 MED ORDER — VITAMIN D (ERGOCALCIFEROL) 1.25 MG (50000 UNIT) PO CAPS
50000.0000 [IU] | ORAL_CAPSULE | ORAL | 0 refills | Status: DC
Start: 2023-07-18 — End: 2023-08-15

## 2023-07-18 MED ORDER — BUPROPION HCL ER (SR) 200 MG PO TB12
200.0000 mg | ORAL_TABLET | Freq: Two times a day (BID) | ORAL | 0 refills | Status: DC
Start: 2023-07-18 — End: 2023-08-15

## 2023-07-18 NOTE — Progress Notes (Signed)
.smr  Office: [phone]  /  Fax: [phone]  WEIGHT SUMMARY AND BIOMETRICS  Anthropometric Measurements Height: 5\' 6"  (1.676 m) Weight: 217 lb (98.4 kg) BMI (Calculated): 35.04 Weight at Last Visit: 217 lb Weight Lost Since Last Visit: 0 Weight Gained Since Last Visit: 3 lb Starting Weight: 232 lb Total Weight Loss (lbs): 15 lb (6.804 kg)   Body Composition  Body Fat %: 39.5 % Fat Mass (lbs): 85.8 lbs Muscle Mass (lbs): 124.8 lbs Total Body Water (lbs): 84.6 lbs Visceral Fat Rating : 10   Other Clinical Data Fasting: no Labs: no Today's Visit #: 34 Starting Date: 04/28/21    Chief Complaint: OBESITY    History of Present Illness   The patient, currently on Wellbutrin for emotional eating behaviors and prescription vitamin D for vitamin D deficiency, presents with concerns about weight gain and emotional eating. She reports a recent weight gain of three pounds over the past month, which coincided with the Thanksgiving holiday. Despite following her category three eating plan approximately 75% of the time, she expresses frustration with her progress and adherence to the plan. She also reports attempting to incorporate physical activity into her routine, walking for 20-30 minutes several times a week.  The patient's stress levels have been high due to the deteriorating health of family members, which she believes is contributing to her emotional eating. She is also on Lexapro for mood regulation, but expresses reluctance to take it due to concerns about adding more medications to her regimen.  The patient acknowledges that her adherence to the eating plan has been inconsistent and expresses a desire for a more structured plan. She expresses interest in a pescetarian plan, but also expresses concerns about her ability to adhere to it strictly. She reports a tendency to snack, particularly when engaged in tasks she does not enjoy, and expresses a preference for raw  vegetables as a snack. She also reports a tendency to eat larger portions of certain foods, such as pasta, and expresses a dislike for "fake" dressings.  The patient acknowledges that her struggles with weight management and emotional eating are likely influenced by emotional and mental factors. She expresses frustration with her lack of progress and a desire for more support and accountability. She also expresses a desire to minimize weight gain over the holiday season and to reassess her progress and plan in the new year.          PHYSICAL EXAM:  Blood pressure 136/79, pulse 70, temperature 98 F (36.7 C), height 5\' 6"  (1.676 m), weight 217 lb (98.4 kg), SpO2 98%. Body mass index is 35.02 kg/m.  DIAGNOSTIC DATA REVIEWED:  BMET    Component Value Date/Time   NA 140 04/18/2023 0747   K 5.1 04/18/2023 0747   CL 101 04/18/2023 0747   CO2 24 04/18/2023 0747   GLUCOSE 106 (H) 04/18/2023 0747   GLUCOSE 105 (H) 02/24/2021 0745   BUN 24 04/18/2023 0747   CREATININE 0.96 04/18/2023 0747   CREATININE 1.03 12/06/2019 0930   CALCIUM 9.9 04/18/2023 0747   GFRNONAA >60 09/02/2020 1309   Lab Results  Component Value Date   HGBA1C 5.7 (H) 04/18/2023   HGBA1C 5.7 10/29/2013   Lab Results  Component Value Date   INSULIN 8.8 04/18/2023   INSULIN 12.2 04/28/2021   Lab Results  Component Value Date   TSH 2.440 09/13/2022   CBC    Component Value Date/Time   WBC 4.6 02/24/2021 0745   RBC 4.89 02/24/2021  0745   HGB 15.0 02/24/2021 0745   HGB 12.7 01/31/2019 1015   HCT 44.0 02/24/2021 0745   HCT 38.0 01/31/2019 1015   PLT 241.0 02/24/2021 0745   PLT 603 (H) 01/31/2019 1015   MCV 89.8 02/24/2021 0745   MCV 91 01/31/2019 1015   MCH 30.4 09/07/2020 0850   MCHC 34.1 02/24/2021 0745   RDW 14.8 02/24/2021 0745   RDW 12.5 01/31/2019 1015   Iron Studies    Component Value Date/Time   FERRITIN 58 09/05/2020 0241   FERRITIN 265 (H) 01/31/2019 1015   Lipid Panel     Component  Value Date/Time   CHOL 249 (H) 04/18/2023 0747   TRIG 104 04/18/2023 0747   HDL 58 04/18/2023 0747   CHOLHDL 4 02/24/2021 0745   VLDL 27.6 02/24/2021 0745   LDLCALC 173 (H) 04/18/2023 0747   LDLCALC 152 (H) 12/06/2019 0930   Hepatic Function Panel     Component Value Date/Time   PROT 7.1 04/18/2023 0747   ALBUMIN 4.4 04/18/2023 0747   AST 18 04/18/2023 0747   ALT 21 04/18/2023 0747   ALKPHOS 116 04/18/2023 0747   BILITOT 0.4 04/18/2023 0747      Component Value Date/Time   TSH 2.440 09/13/2022 0930   Nutritional Lab Results  Component Value Date   VD25OH 50.7 04/18/2023   VD25OH 32.9 09/13/2022   VD25OH 30.6 02/28/2022     Assessment and Plan    Obesity and Emotional Eating   Reports emotional eating exacerbated by high stress due to family health issues. Currently on Wellbutrin and Lexapro for mood management. Gained three pounds in the last month, partially attributed to holiday eating. Discussed structured eating plans, protein intake, and portion control. Emphasized journaling and raw vegetable snacks to manage cravings.  On Lexapro but has not been taking it due to reluctance to add more medications. Reports feeling sad but not anxious. Discussed importance of informed decision-making regarding medication adherence. Prefers not to add more medications at this time.   - Refill Wellbutrin   - Continue Lexapro as prescribed if she decides to resume   - Consider alternative structured eating plans, including pescetarian plan with additional 200 calories   - Encourage meal planning and raw vegetable snacks     Vitamin D Deficiency   Chronic vitamin D deficiency managed with prescription vitamin D 50,000 IU weekly. No new symptoms reported.   - Refill prescription vitamin D 50,000 IU weekly    Follow-up   - Confirm January appointment on August 15, 2023   - Schedule February appointment.       She was informed of the importance of frequent follow up visits to maximize  her success with intensive lifestyle modifications for her multiple health conditions.    Quillian Quince, MD

## 2023-07-21 ENCOUNTER — Ambulatory Visit: Payer: 59 | Admitting: Psychology

## 2023-07-21 DIAGNOSIS — F4321 Adjustment disorder with depressed mood: Secondary | ICD-10-CM | POA: Diagnosis not present

## 2023-07-21 NOTE — Progress Notes (Signed)
[facility] Counselor Initial Adult Exam  Name: Sherry Marquez Date: 07/21/2023 MRN: [id number] DOB: Aug 10, 1970 PCP: Karie Georges, MD  Time spent: 10:00am-10:50am   50 minutes  Guardian/Payee:  Donnamarie Poag requested: No   Reason for Visit /Presenting Problem: Pt present for face-to-face initial assessment update via video.  Pt consents to telehealth video session and is aware of limitations and benefits of virtual sessions. Location of pt: home Location of therapist: home office.  Pt is 52 yo white female.  Pt lives with her husband and two sons ages 62 and 21.   Pt states her 39 yo is an anxious child.  The family has had multiple losses the past couple of years.   Pt's in laws died of COVID and her brother in law died from suicide.   Pt talked about the recent death of her cousin.   She is grieving and feels stress of having a funeral next week so close to Christmas. Helped pt process her feelings and grief.   Pt has some challenges in her relationship with her husband.  She is working on finding her voice in the marriage.   Pt tends to take on other people's emotions and puts hers on the back burner.   Pt wants to learn to deal with her emotions better.   Reviewed pt's treatment plan for annual update.  Updated pt's treatment plan and IA.  Pt participated in setting treatment goals.  Pt would like to manage stress better and improve coping skills.  Plan to meet monthly.      Mental Status Exam: Appearance:   Casual     Behavior:  Appropriate  Motor:  Normal  Speech/Language:   Normal Rate  Affect:  Appropriate  Mood:  normal  Thought process:  normal  Thought content:    WNL  Sensory/Perceptual disturbances:    WNL  Orientation:  oriented to person, place, time/date, and situation  Attention:  Good  Concentration:  Good  Memory:  WNL  Fund of knowledge:   Good  Insight:    Good  Judgment:   Good  Impulse Control:  Good    Reported Symptoms:   stress, sadness  Risk Assessment: Danger to Self:  No Self-injurious Behavior: No Danger to Others: No Duty to Warn:no Physical Aggression / Violence:No  Access to Firearms a concern: No  Gang Involvement:No  Patient / guardian was educated about steps to take if suicide or homicide risk level increases between visits: n/a While future psychiatric events cannot be accurately predicted, the patient does not currently require acute inpatient psychiatric care and does not currently meet [facility] involuntary commitment criteria.  Substance Abuse History: Current substance abuse: No     Past Psychiatric History:   No previous psychological problems have been observed Outpatient Providers:none previously History of Psych Hospitalization: No  Psychological Testing:  n/a    Abuse History:  Victim of: No.,  n/a    Report needed: No. Victim of Neglect:No. Perpetrator of  n/a   Witness / Exposure to Domestic Violence: No   Protective Services Involvement: No  Witness to [employer] Violence:  No   Family History:  Family History  Problem Relation Age of Onset   Atrial fibrillation Mother    Hypothyroidism Mother    Hypertension Mother    Heart disease Mother    Sleep apnea Mother    Obesity Mother    Heart disease Father  defibrillator   Hypertension Father    Heart attack Father 57   CAD Father        4-way bypass   High Cholesterol Father    Sleep apnea Father    Obesity Father    Hypertension Brother    Lymphoma Maternal Grandmother 9   Congestive Heart Failure Maternal Grandmother    Stroke Maternal Grandfather 69   Cancer Paternal Grandmother        mets to lung   Heart attack Paternal Grandfather        early death    Living situation: the patient lives with her husband and two sons.    Pt grew up with both parents and an older brother.   Pt states that overall her childhood was good and her parents were supportive.   Pt is not very close to her  brother.   Pt states she is close to her parents.   Family history of mental health issues: Father has anxiety.  Brother is alcoholic but has been sober for 6 years.   No child abuse.    Sexual Orientation: Straight  Relationship Status: married  Name of spouse / other:pt and husband [name] have been married since 2003.  If a parent, number of children / ages:pt has two teenage sons.  Support Systems: spouse parents  Financial Stress:  No   Income/Employment/Disability: Employment.  Pt's husband work.   Pt is a stay at home mom.  Military Service: No   Educational History: Education: Risk manager: Protestant  Any cultural differences that may affect / interfere with treatment:  not applicable   Recreation/Hobbies: gardening  Stressors: Loss of in laws.  Pt has had several losses in the past couple of years.     Strengths: Supportive Relationships, Family, Hopefulness, Self Advocate, and Able to Communicate Effectively  Barriers:  none   Legal History: Pending legal issue / charges: The patient has no significant history of legal issues. History of legal issue / charges:  n/a  Medical History/Surgical History: reviewed Past Medical History:  Diagnosis Date   Allergy    Anxiety    Back pain    COVID    Diabetes (HCC)    Gestational   Eczema    Environmental and seasonal allergies    High cholesterol    History of gestational diabetes    Hx of blood clots    Joint pain    Lack of energy    LUMBAR SPRAIN AND STRAIN 12/05/2009   Qualifier: Diagnosis of  By: Clent Ridges MD, Tera Mater    Obesity    Osteoarthritis    Pneumonia    Right ovarian cyst    Rosacea    SOB (shortness of breath)     Past Surgical History:  Procedure Laterality Date   CESAREAN SECTION  03-15-2005   dr Audie Box  @[facility]    LAPAROSCOPY N/A 05/20/2019   Procedure: LAPAROSCOPY DIAGNOSTIC;  Surgeon: Dara Lords, MD;  Location: [facility];   Service: Gynecology;  Laterality: N/A;   WISDOM TOOTH EXTRACTION  2001    Medications: Current Outpatient Medications  Medication Sig Dispense Refill   buPROPion (WELLBUTRIN SR) 200 MG 12 hr tablet Take 1 tablet (200 mg total) by mouth 2 (two) times daily. 60 tablet 0   cetirizine (ZYRTEC) 10 MG tablet Take 10 mg by mouth daily.     COLLAGEN PO Take by mouth. 1 scoop daily     escitalopram (LEXAPRO) 10  MG tablet Take 1 tablet (10 mg total) by mouth daily. 30 tablet 0   MAGNESIUM PO Take by mouth daily.     POTASSIUM CHLORIDE PO Take 640 mg by mouth.     triamcinolone cream (KENALOG) 0.1 % Apply 1 Application topically 2 (two) times daily. Apply thin layer, cover with a thick hypoallergenic or non fragrant cream. 30 g 0   Vitamin D, Ergocalciferol, (DRISDOL) 1.25 MG (50000 UNIT) CAPS capsule Take 1 capsule (50,000 Units total) by mouth every 7 (seven) days. 4 capsule 0   No current facility-administered medications for this visit.    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   Client Treatment Preferences  Individual therapy.  Client Statement of Needs  Improve coping skills.  Symptoms  Depressed or irritable mood. Low self-esteem. Unresolved grief issues.   Problems Addressed  Unipolar Depression Goals 1. Alleviate depressive symptoms and return to previous level of effective functioning. 2. Appropriately grieve the loss in order to normalize mood and to return to previously adaptive level of functioning. Objective Learn and implement behavioral strategies to overcome depression. Target Date: 2024-07-20 Frequency: monthly  Progress: 50 Modality: individual  Related Interventions Engage the  client in "behavioral activation," increasing his/her activity level and contact with sources of reward, while identifying processes that inhibit activation.  Use behavioral techniques such as instruction, rehearsal, role-playing, role reversal, as needed, to facilitate activity in the client's daily life; reinforce success. Assist the client in developing skills that increase the likelihood of deriving pleasure from behavioral activation (e.g., assertiveness skills, developing an exercise plan, less internal/more external focus, increased social involvement); reinforce success. Objective Identify important people in life, past and present, and describe the quality, good and poor, of those relationships. Target Date: 2024-07-20 Frequency: monthly  Progress: 50 Modality: individual  Related Interventions Conduct Interpersonal Therapy beginning with the assessment of the client's "interpersonal inventory" of important past and present relationships; develop a case formulation linking depression to grief, interpersonal role disputes, role transitions, and/or interpersonal deficits). Objective Learn and implement problem-solving and decision-making skills. Target Date: 2024-07-20 Frequency: monthly  Progress: 50 Modality: individual  Related Interventions Conduct Problem-Solving Therapy using techniques such as psychoeducation, modeling, and role-playing to teach client problem-solving skills (i.e., defining a problem specifically, generating possible solutions, evaluating the pros and cons of each solution, selecting and implementing a plan of action, evaluating the efficacy of the plan, accepting or revising the plan); role-play application of the problem-solving skill to a real life issue. Encourage in the client the development of a positive problem orientation in which problems and solving them are viewed as a natural part of life and not something to be feared, despaired, or avoided. 3. Develop  healthy interpersonal relationships that lead to the alleviation and help prevent the relapse of depression. 4. Develop healthy thinking patterns and beliefs about self, others, and the world that lead to the alleviation and help prevent the relapse of depression. 5. Recognize, accept, and cope with feelings of depression. Diagnosis F43.21 Conditions For Discharge Achievement of treatment goals and objectives    Salomon Fick, LCSW

## 2023-08-15 ENCOUNTER — Encounter (INDEPENDENT_AMBULATORY_CARE_PROVIDER_SITE_OTHER): Payer: Self-pay | Admitting: Family Medicine

## 2023-08-15 ENCOUNTER — Ambulatory Visit (INDEPENDENT_AMBULATORY_CARE_PROVIDER_SITE_OTHER): Payer: 59 | Admitting: Family Medicine

## 2023-08-15 VITALS — BP 146/83 | HR 61 | Temp 97.8°F | Ht 66.0 in | Wt 215.0 lb

## 2023-08-15 DIAGNOSIS — E559 Vitamin D deficiency, unspecified: Secondary | ICD-10-CM | POA: Diagnosis not present

## 2023-08-15 DIAGNOSIS — E669 Obesity, unspecified: Secondary | ICD-10-CM | POA: Diagnosis not present

## 2023-08-15 DIAGNOSIS — R030 Elevated blood-pressure reading, without diagnosis of hypertension: Secondary | ICD-10-CM

## 2023-08-15 DIAGNOSIS — F3289 Other specified depressive episodes: Secondary | ICD-10-CM

## 2023-08-15 DIAGNOSIS — F5089 Other specified eating disorder: Secondary | ICD-10-CM | POA: Diagnosis not present

## 2023-08-15 DIAGNOSIS — Z6834 Body mass index (BMI) 34.0-34.9, adult: Secondary | ICD-10-CM

## 2023-08-15 MED ORDER — BUPROPION HCL ER (SR) 200 MG PO TB12
200.0000 mg | ORAL_TABLET | Freq: Two times a day (BID) | ORAL | 0 refills | Status: DC
Start: 2023-08-15 — End: 2023-09-12

## 2023-08-15 MED ORDER — VITAMIN D (ERGOCALCIFEROL) 1.25 MG (50000 UNIT) PO CAPS
50000.0000 [IU] | ORAL_CAPSULE | ORAL | 0 refills | Status: DC
Start: 2023-08-15 — End: 2023-09-12

## 2023-08-15 NOTE — Progress Notes (Signed)
 .smr  Office: [phone]  /  Fax: [phone]  WEIGHT SUMMARY AND BIOMETRICS  Anthropometric Measurements Height: 5' 6 (1.676 m) Weight: 215 lb (97.5 kg) BMI (Calculated): 34.72 Weight at Last Visit: 217 lb Weight Lost Since Last Visit: 2 lb Weight Gained Since Last Visit: 0 Starting Weight: 232 lb Total Weight Loss (lbs): 17 lb (7.711 kg)   Body Composition  Body Fat %: 39.3 % Fat Mass (lbs): 84.4 lbs Muscle Mass (lbs): 124 lbs Total Body Water (lbs): 83.2 lbs Visceral Fat Rating : 10   Other Clinical Data Fasting: No Labs: No Today's Visit #: 35 Starting Date: 04/28/21    Chief Complaint: OBESITY  History of Present Illness   The patient, with a known history of vitamin D  deficiency, emotional eating behaviors, and obesity, presents for a follow-up visit. She reports a weight loss of two pounds over the past month, despite the holiday season. She has been adhering to her category three eating plan approximately 60% of the time and has incorporated walking for 20 minutes daily as a form of exercise.  The patient has recently experienced several stressful life events, including a family funeral and the decision to euthanize her pet cat. Despite these stressors, she has managed to maintain her weight loss efforts. She has also started using a lymphatic drainage cream to manage perceived water retention and has purchased a weighted vest to increase the intensity of her daily walks.  The patient has been journaling several days a week as a coping mechanism for stress and emotional eating. She reports no issues with her current medications. However, she has noticed an increase in her blood pressure, which she attributes to recent stressors. She has been advised to monitor her blood pressure at home.  In terms of dietary habits, the patient attempted a pescatarian diet but found it left her feeling hungry. She has since returned to her category three eating plan, making minor  adjustments such as reducing egg intake and replacing crackers with low-fat bread. She has also increased her water intake.          PHYSICAL EXAM:  Blood pressure (!) 146/83, pulse 61, temperature 97.8 F (36.6 C), height 5' 6 (1.676 m), weight 215 lb (97.5 kg), SpO2 98%. Body mass index is 34.7 kg/m.  DIAGNOSTIC DATA REVIEWED:  BMET    Component Value Date/Time   NA 140 04/18/2023 0747   K 5.1 04/18/2023 0747   CL 101 04/18/2023 0747   CO2 24 04/18/2023 0747   GLUCOSE 106 (H) 04/18/2023 0747   GLUCOSE 105 (H) 02/24/2021 0745   BUN 24 04/18/2023 0747   CREATININE 0.96 04/18/2023 0747   CREATININE 1.03 12/06/2019 0930   CALCIUM 9.9 04/18/2023 0747   GFRNONAA >60 09/02/2020 1309   Lab Results  Component Value Date   HGBA1C 5.7 (H) 04/18/2023   HGBA1C 5.7 10/29/2013   Lab Results  Component Value Date   INSULIN  8.8 04/18/2023   INSULIN  12.2 04/28/2021   Lab Results  Component Value Date   TSH 2.440 09/13/2022   CBC    Component Value Date/Time   WBC 4.6 02/24/2021 0745   RBC 4.89 02/24/2021 0745   HGB 15.0 02/24/2021 0745   HGB 12.7 01/31/2019 1015   HCT 44.0 02/24/2021 0745   HCT 38.0 01/31/2019 1015   PLT 241.0 02/24/2021 0745   PLT 603 (H) 01/31/2019 1015   MCV 89.8 02/24/2021 0745   MCV 91 01/31/2019 1015   MCH 30.4 09/07/2020 0850  MCHC 34.1 02/24/2021 0745   RDW 14.8 02/24/2021 0745   RDW 12.5 01/31/2019 1015   Iron Studies    Component Value Date/Time   FERRITIN 58 09/05/2020 0241   FERRITIN 265 (H) 01/31/2019 1015   Lipid Panel     Component Value Date/Time   CHOL 249 (H) 04/18/2023 0747   TRIG 104 04/18/2023 0747   HDL 58 04/18/2023 0747   CHOLHDL 4 02/24/2021 0745   VLDL 27.6 02/24/2021 0745   LDLCALC 173 (H) 04/18/2023 0747   LDLCALC 152 (H) 12/06/2019 0930   Hepatic Function Panel     Component Value Date/Time   PROT 7.1 04/18/2023 0747   ALBUMIN 4.4 04/18/2023 0747   AST 18 04/18/2023 0747   ALT 21 04/18/2023 0747    ALKPHOS 116 04/18/2023 0747   BILITOT 0.4 04/18/2023 0747      Component Value Date/Time   TSH 2.440 09/13/2022 0930   Nutritional Lab Results  Component Value Date   VD25OH 50.7 04/18/2023   VD25OH 32.9 09/13/2022   VD25OH 30.6 02/28/2022     Assessment and Plan    Vitamin D  Deficiency   -refill vit D Rx  Obesity   Patient has lost two pounds in the last month. She follows a category three eating plan 60% of the time and walks 20 minutes seven times per week, using a weighted vest for additional resistance. Dietary adjustments include reducing egg intake and substituting low-fat bread for crackers.   - Continue category three eating plan   - Continue walking for exercise with weighted vest   - Maintain dietary adjustments    Emotional Eating   Patient reports emotional eating during stressful times and finds journaling helpful for emotional regulation.   - Continue journaling to manage emotional eating   -refill Wellbutrin   Elevated BP without hx of Hypertension   Blood pressure is slightly elevated, potentially due to recent stress from family issues and recent events, including a funeral and the loss of a pet.   - Monitor blood pressure at home    General Health Maintenance   Patient is making lifestyle modifications including dietary changes, increased water intake, and regular physical activity, resulting in feeling less tired and more energetic.   - Continue dietary modifications   - Continue increased water intake   - Continue regular physical activity    Follow-up   - Schedule follow-up apt 4 w          She was informed of the importance of frequent follow up visits to maximize her success with intensive lifestyle modifications for her multiple health conditions.    Louann Penton, MD

## 2023-08-18 ENCOUNTER — Ambulatory Visit: Payer: 59 | Admitting: Psychology

## 2023-08-18 DIAGNOSIS — F4321 Adjustment disorder with depressed mood: Secondary | ICD-10-CM

## 2023-08-18 NOTE — Progress Notes (Signed)
 [facility] Counselor/Therapist Progress Note  Patient ID: Sherry Marquez, MRN: [id number],    Date: 08/18/2023  Time Spent: 10:00am-10:55am   55 minutes   Treatment Type: Individual Therapy  Reported Symptoms: stress  Mental Status Exam: Appearance:  Casual     Behavior: Appropriate  Motor: Normal  Speech/Language:  Normal Rate  Affect: Appropriate  Mood: normal  Thought process: normal  Thought content:   WNL  Sensory/Perceptual disturbances:   WNL  Orientation: oriented to person, place, time/date, and situation  Attention: Good  Concentration: Good  Memory: Immediate;   Good  Fund of knowledge:  Good  Insight:   Good  Judgment:  Good  Impulse Control: Good   Risk Assessment: Danger to Self:  No Self-injurious Behavior: No Danger to Others: No Duty to Warn:no Physical Aggression / Violence:No  Access to Firearms a concern: No  Gang Involvement:No   Subjective: Pt present for face-to-face individual therapy via video.  Pt consents to telehealth video session and is aware of limitations and benefits of virtual sessions.   Location of pt: home Location of therapist: home office.   Pt talked about having a busy month with the holidays and attending the funeral for a family member.   Pt talked about having to put down her cat.  Helped pt process her feelings and grief.   Pt talked about feeling lost in life and not content.   She feels fortunate that she does not have to work but does not have a sense of purpose.  Addressed how pt can develop herself.  She is looking for volunteer opportunities she can participate in.    Pt states she does not have any friends which bothers her.  Addressed how pt can make more social connections.   Pt states she has been journaling.  Worked on journaling prompts that can help her be more aware of her thoughts.  Pt talked about choosing a word for the year.  Her word this year is routines.  Pt would like to create some  healthy routines this year.  Helped pt identify the areas she wants to target for setting routines.  Pt has realized she needs to work on forgiveness.  She needs to work on forgiving her husband [name] for being so controlling.  Identified that pt needs to work on forgiving herself as well bc she can tend to be very hard on herself.   Worked on self care strategies. Provided supportive therapy.    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   Pt participated in setting treatment goals.   Plan to meet monthly.   Pt agrees with treatment plan.  Treatment Plan(treatment plan target date 07/20/2024) Client Abilities/Strengths  Pt is bright, engaging, and motivated for therapy.   Client Treatment Preferences  Individual therapy.  Client Statement of Needs  Improve coping skills.  Symptoms  Depressed or irritable mood. Low self-esteem. Unresolved grief issues.   Problems Addressed  Unipolar Depression Goals 1. Alleviate depressive symptoms and return to previous level of effective functioning. 2. Appropriately grieve the loss in order to normalize mood and to return to previously adaptive level of functioning. Objective Learn and implement behavioral strategies to overcome depression. Target Date: 2024-07-20 Frequency: monthly  Progress: 50 Modality: individual  Related Interventions Engage the client in behavioral activation, increasing his/her activity level and contact with sources of reward, while identifying processes that inhibit activation.  Use  behavioral techniques such as instruction, rehearsal, role-playing, role reversal, as needed, to facilitate activity in the client's daily life; reinforce success. Assist the client in developing skills that increase the likelihood of deriving pleasure from behavioral activation (e.g., assertiveness skills, developing an exercise plan, less internal/more  external focus, increased social involvement); reinforce success. Objective Identify important people in life, past and present, and describe the quality, good and poor, of those relationships. Target Date: 2024-07-20 Frequency: monthly  Progress: 50 Modality: individual  Related Interventions Conduct Interpersonal Therapy beginning with the assessment of the client's interpersonal inventory of important past and present relationships; develop a case formulation linking depression to grief, interpersonal role disputes, role transitions, and/or interpersonal deficits). Objective Learn and implement problem-solving and decision-making skills. Target Date: 2024-07-20 Frequency: monthly  Progress: 50 Modality: individual  Related Interventions Conduct Problem-Solving Therapy using techniques such as psychoeducation, modeling, and role-playing to teach client problem-solving skills (i.e., defining a problem specifically, generating possible solutions, evaluating the pros and cons of each solution, selecting and implementing a plan of action, evaluating the efficacy of the plan, accepting or revising the plan); role-play application of the problem-solving skill to a real life issue. Encourage in the client the development of a positive problem orientation in which problems and solving them are viewed as a natural part of life and not something to be feared, despaired, or avoided. 3. Develop healthy interpersonal relationships that lead to the alleviation and help prevent the relapse of depression. 4. Develop healthy thinking patterns and beliefs about self, others, and the world that lead to the alleviation and help prevent the relapse of depression. 5. Recognize, accept, and cope with feelings of depression. Diagnosis F43.21 Conditions For Discharge Achievement of treatment goals and objectives   [name], LCSW

## 2023-09-12 ENCOUNTER — Encounter (INDEPENDENT_AMBULATORY_CARE_PROVIDER_SITE_OTHER): Payer: Self-pay | Admitting: Family Medicine

## 2023-09-12 ENCOUNTER — Ambulatory Visit (INDEPENDENT_AMBULATORY_CARE_PROVIDER_SITE_OTHER): Payer: 59 | Admitting: Family Medicine

## 2023-09-12 VITALS — BP 130/80 | HR 75 | Temp 97.9°F | Ht 66.0 in | Wt 217.0 lb

## 2023-09-12 DIAGNOSIS — Z6835 Body mass index (BMI) 35.0-35.9, adult: Secondary | ICD-10-CM

## 2023-09-12 DIAGNOSIS — M79672 Pain in left foot: Secondary | ICD-10-CM | POA: Diagnosis not present

## 2023-09-12 DIAGNOSIS — F5089 Other specified eating disorder: Secondary | ICD-10-CM | POA: Diagnosis not present

## 2023-09-12 DIAGNOSIS — R7303 Prediabetes: Secondary | ICD-10-CM

## 2023-09-12 DIAGNOSIS — E559 Vitamin D deficiency, unspecified: Secondary | ICD-10-CM

## 2023-09-12 DIAGNOSIS — E669 Obesity, unspecified: Secondary | ICD-10-CM

## 2023-09-12 DIAGNOSIS — F3289 Other specified depressive episodes: Secondary | ICD-10-CM

## 2023-09-12 MED ORDER — BUPROPION HCL ER (SR) 200 MG PO TB12: 200.0000 mg | ORAL_TABLET | Freq: Two times a day (BID) | ORAL | 0 refills | Status: DC

## 2023-09-12 MED ORDER — VITAMIN D (ERGOCALCIFEROL) 1.25 MG (50000 UNIT) PO CAPS
50000.0000 [IU] | ORAL_CAPSULE | ORAL | 0 refills | Status: DC
Start: 2023-09-12 — End: 2023-09-12

## 2023-09-12 MED ORDER — VITAMIN D (ERGOCALCIFEROL) 1.25 MG (50000 UNIT) PO CAPS: 50000.0000 [IU] | ORAL_CAPSULE | ORAL | 0 refills | Status: DC

## 2023-09-12 NOTE — Progress Notes (Signed)
 .smr  Office: [phone]  /  Fax: [phone]  WEIGHT SUMMARY AND BIOMETRICS  Anthropometric Measurements Height: 5' 6 (1.676 m) Weight: 217 lb (98.4 kg) BMI (Calculated): 35.04 Weight at Last Visit: 215 lb Weight Lost Since Last Visit: 0 Weight Gained Since Last Visit: 2 lb Starting Weight: 232 lb Total Weight Loss (lbs): 15 lb (6.804 kg)   Body Composition  Body Fat %: 40.6 % Fat Mass (lbs): 88.2 lbs Muscle Mass (lbs): 122.6 lbs Total Body Water (lbs): 85.6 lbs Visceral Fat Rating : 11   Other Clinical Data Fasting: No Labs: No Today's Visit #: 36 Starting Date: 04/28/21    Chief Complaint: OBESITY  History of Present Illness   Sherry Marquez is a 53 year old female with obesity, vitamin D  deficiency, and prediabetes who presents for management of these conditions.  She is experiencing weight gain, having gained two pounds over the past month. She follows her category three eating plan 75% of the time and engages in physical activity, including walking for 20 minutes three times per week and weight lifting for 30 minutes twice a week. She is working on diet, exercise, and weight loss to improve her prediabetes and is not currently on medications for this condition.  She is currently taking prescription vitamin D  at a dose of 50,000 international units per week for her vitamin D  deficiency and is requesting a refill.  She is on blood insulin  for emotional eating behaviors and is requesting a refill. Her diet is inconsistent, stating 'I'd be good for a few days and then I'd fall off for a few days.' She feels tired and has not been consistent with her exercise routine this month. She is meal planning and trying new recipes, focusing on high protein meals.  She experiences stiffness in the morning, with her back and arms falling asleep during the night. She also reports plantar fasciitis flare-ups, likely due to weight gain, and is seeking a podiatrist recommendation  for her arthritic big toe and Left foot pain in the second toe, which she feels on the ball of her foot when walking.          PHYSICAL EXAM:  Blood pressure 130/80, pulse 75, temperature 97.9 F (36.6 C), height 5' 6 (1.676 m), weight 217 lb (98.4 kg), SpO2 97%. Body mass index is 35.02 kg/m.  DIAGNOSTIC DATA REVIEWED:  BMET    Component Value Date/Time   NA 140 04/18/2023 0747   K 5.1 04/18/2023 0747   CL 101 04/18/2023 0747   CO2 24 04/18/2023 0747   GLUCOSE 106 (H) 04/18/2023 0747   GLUCOSE 105 (H) 02/24/2021 0745   BUN 24 04/18/2023 0747   CREATININE 0.96 04/18/2023 0747   CREATININE 1.03 12/06/2019 0930   CALCIUM 9.9 04/18/2023 0747   GFRNONAA >60 09/02/2020 1309   Lab Results  Component Value Date   HGBA1C 5.7 (H) 04/18/2023   HGBA1C 5.7 10/29/2013   Lab Results  Component Value Date   INSULIN  8.8 04/18/2023   INSULIN  12.2 04/28/2021   Lab Results  Component Value Date   TSH 2.440 09/13/2022   CBC    Component Value Date/Time   WBC 4.6 02/24/2021 0745   RBC 4.89 02/24/2021 0745   HGB 15.0 02/24/2021 0745   HGB 12.7 01/31/2019 1015   HCT 44.0 02/24/2021 0745   HCT 38.0 01/31/2019 1015   PLT 241.0 02/24/2021 0745   PLT 603 (H) 01/31/2019 1015   MCV 89.8 02/24/2021 0745   MCV  91 01/31/2019 1015   MCH 30.4 09/07/2020 0850   MCHC 34.1 02/24/2021 0745   RDW 14.8 02/24/2021 0745   RDW 12.5 01/31/2019 1015   Iron Studies    Component Value Date/Time   FERRITIN 58 09/05/2020 0241   FERRITIN 265 (H) 01/31/2019 1015   Lipid Panel     Component Value Date/Time   CHOL 249 (H) 04/18/2023 0747   TRIG 104 04/18/2023 0747   HDL 58 04/18/2023 0747   CHOLHDL 4 02/24/2021 0745   VLDL 27.6 02/24/2021 0745   LDLCALC 173 (H) 04/18/2023 0747   LDLCALC 152 (H) 12/06/2019 0930   Hepatic Function Panel     Component Value Date/Time   PROT 7.1 04/18/2023 0747   ALBUMIN 4.4 04/18/2023 0747   AST 18 04/18/2023 0747   ALT 21 04/18/2023 0747   ALKPHOS 116  04/18/2023 0747   BILITOT 0.4 04/18/2023 0747      Component Value Date/Time   TSH 2.440 09/13/2022 0930   Nutritional Lab Results  Component Value Date   VD25OH 50.7 04/18/2023   VD25OH 32.9 09/13/2022   VD25OH 30.6 02/28/2022     Assessment and Plan    Obesity Gained 2 pounds in the last month. Following category three eating plan 75% of the time. Engaging in physical activities including walking and weight lifting. Discussed the importance of consistency in meal planning and high-protein recipes. - Continue current diet and exercise regimen - Encourage consistency in meal planning and high-protein recipes - Follow up in four weeks  Prediabetes Not currently on medications. Discussed potential benefits and side effects of metformin , including reducing hunger between meals and low likelihood of adverse effects. Explained that 20% of the population may experience side effects, while 80% do not. Emphasized that metformin  is not a permanent medication and can be discontinued if adverse effects occur. - Start metformin , one pill in the morning with the first meal - Monitor for side effects and effectiveness  Emotional Eating Behaviors On blood insulin  for emotional eating behaviors. Well-managed with no increase in blood pressure. - Refill prescription for blood insulin   Vitamin D  Deficiency On prescription vitamin D  50,000 IU per week. 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 Also has a history of plantar fasciitis - Referral to podiatrist Dr. Alona at [facility] - Provide contact number for scheduling  General Health Maintenance Considering weaning off Wellbutrin  in the next few months but will not make changes concurrently with starting metformin . - Refill prescription for  Wellbutrin  - Discuss weaning off Wellbutrin  in future visits  Follow-up - Follow up in four weeks.        She was informed of the importance of frequent follow up visits to maximize her success with intensive lifestyle modifications for her multiple health conditions.    Louann Penton, MD

## 2023-09-14 ENCOUNTER — Ambulatory Visit (INDEPENDENT_AMBULATORY_CARE_PROVIDER_SITE_OTHER): Payer: 59 | Admitting: Psychology

## 2023-09-14 DIAGNOSIS — F4321 Adjustment disorder with depressed mood: Secondary | ICD-10-CM | POA: Diagnosis not present

## 2023-09-14 NOTE — Progress Notes (Signed)
 [facility] Counselor/Therapist Progress Note  Patient ID: BREEZE BERRINGER, MRN: [id number],    Date: 09/14/2023  Time Spent: 10:00am-10:55am   55 minutes   Treatment Type: Individual Therapy  Reported Symptoms: stress  Mental Status Exam: Appearance:  Casual     Behavior: Appropriate  Motor: Normal  Speech/Language:  Normal Rate  Affect: Appropriate  Mood: normal  Thought process: normal  Thought content:   WNL  Sensory/Perceptual disturbances:   WNL  Orientation: oriented to person, place, time/date, and situation  Attention: Good  Concentration: Good  Memory: Immediate;   Good  Fund of knowledge:  Good  Insight:   Good  Judgment:  Good  Impulse Control: Good   Risk Assessment: Danger to Self:  No Self-injurious Behavior: No Danger to Others: No Duty to Warn:no Physical Aggression / Violence:No  Access to Firearms a concern: No  Gang Involvement:No   Subjective: Pt present for face-to-face individual therapy via video.  Pt consents to telehealth video session and is aware of limitations and benefits of virtual sessions.   Location of pt: home Location of therapist: home office.   Pt talked about thinking about getting training and looking for a job in book keeping.  She wants to help small businesses.  She is excited about this pursuit and feels like it will give her more of a sense of purpose.   Pt talked about wanting to make some friends.  There are a couple of women pt would like to connect with but she feels anxious about it.  Worked with pt on how she can reach out to those women.   Addressed pt's anxiety.   Pt is working on building healthier routines for each day.  Pt wants to add exercise.   Worked with pt on how she can build in that routine.  Pt is not happy with her eating habits.   She is inconsistent with healthy eating choices.  Pt is going to [employer] [facility] [employer]. Pt talked about her son Dusty planning to have his girlfriend to  the house on Valentine's Day.  Pt is anxious about it.  Addressed how pt can prepare for the visit.  Worked on self care strategies. Provided supportive therapy.    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   Pt participated in setting treatment goals.   Plan to meet monthly.   Pt agrees with treatment plan.  Treatment Plan(treatment plan target date 07/20/2024) Client Abilities/Strengths  Pt is bright, engaging, and motivated for therapy.   Client Treatment Preferences  Individual therapy.  Client Statement of Needs  Improve coping skills.  Symptoms  Depressed or irritable mood. Low self-esteem. Unresolved grief issues.   Problems Addressed  Unipolar Depression Goals 1. Alleviate depressive symptoms and return to previous level of effective functioning. 2. Appropriately grieve the loss in order to normalize mood and to return to previously adaptive level of functioning. Objective Learn and implement behavioral strategies to overcome depression. Target Date: 2024-07-20 Frequency: monthly  Progress: 50 Modality: individual  Related Interventions Engage the client in behavioral activation, increasing his/her activity level and contact with sources of reward, while identifying processes that inhibit activation.  Use behavioral techniques such as instruction, rehearsal, role-playing, role reversal, as needed, to facilitate activity in the client's daily life; reinforce success. Assist the client in developing skills that increase the likelihood of deriving pleasure from behavioral activation (e.g., assertiveness skills, developing  an exercise plan, less internal/more external focus, increased social involvement); reinforce success. Objective Identify important people in life, past and present, and describe the quality, good and poor, of those relationships. Target Date: 2024-07-20 Frequency:  monthly  Progress: 50 Modality: individual  Related Interventions Conduct Interpersonal Therapy beginning with the assessment of the client's interpersonal inventory of important past and present relationships; develop a case formulation linking depression to grief, interpersonal role disputes, role transitions, and/or interpersonal deficits). Objective Learn and implement problem-solving and decision-making skills. Target Date: 2024-07-20 Frequency: monthly  Progress: 50 Modality: individual  Related Interventions Conduct Problem-Solving Therapy using techniques such as psychoeducation, modeling, and role-playing to teach client problem-solving skills (i.e., defining a problem specifically, generating possible solutions, evaluating the pros and cons of each solution, selecting and implementing a plan of action, evaluating the efficacy of the plan, accepting or revising the plan); role-play application of the problem-solving skill to a real life issue. Encourage in the client the development of a positive problem orientation in which problems and solving them are viewed as a natural part of life and not something to be feared, despaired, or avoided. 3. Develop healthy interpersonal relationships that lead to the alleviation and help prevent the relapse of depression. 4. Develop healthy thinking patterns and beliefs about self, others, and the world that lead to the alleviation and help prevent the relapse of depression. 5. Recognize, accept, and cope with feelings of depression. Diagnosis F43.21 Conditions For Discharge Achievement of treatment goals and objectives   [name], LCSW

## 2023-09-22 ENCOUNTER — Encounter: Payer: Self-pay | Admitting: Podiatry

## 2023-09-22 ENCOUNTER — Ambulatory Visit (INDEPENDENT_AMBULATORY_CARE_PROVIDER_SITE_OTHER): Payer: 59

## 2023-09-22 ENCOUNTER — Ambulatory Visit: Payer: 59 | Admitting: Podiatry

## 2023-09-22 DIAGNOSIS — M778 Other enthesopathies, not elsewhere classified: Secondary | ICD-10-CM

## 2023-09-22 DIAGNOSIS — M2022 Hallux rigidus, left foot: Secondary | ICD-10-CM | POA: Diagnosis not present

## 2023-09-22 MED ORDER — MELOXICAM 15 MG PO TABS: 15.0000 mg | ORAL_TABLET | Freq: Every day | ORAL | 0 refills | Status: AC | PRN

## 2023-09-22 NOTE — Progress Notes (Signed)
Subjective:   Patient ID: Sherry Marquez, female   DOB: 53 y.o.   MRN: [id number]   HPI Chief Complaint  Patient presents with   Foot Pain    new pt-pain to ball of left foot   53 year old female presents the office with above concerns.  She states any pain to the ball of her foot she points on the second MTPJ.  Patient upon the patient was having on the top as well.  She denies any recent treatment.  Injuries that she reports.  No numbness or tingling.  She does state that she has arthritis in her big toe joint this is been ongoing for many years.  Review of Systems  All other systems reviewed and are negative.  Past Medical History:  Diagnosis Date   Allergy    Anxiety    Back pain    COVID    Diabetes (HCC)    Gestational   Eczema    Environmental and seasonal allergies    High cholesterol    History of gestational diabetes    Hx of blood clots    Joint pain    Lack of energy    LUMBAR SPRAIN AND STRAIN 12/05/2009   Qualifier: Diagnosis of  By: Clent Ridges MD, Tera Mater    Obesity    Osteoarthritis    Pneumonia    Right ovarian cyst    Rosacea    SOB (shortness of breath)     Past Surgical History:  Procedure Laterality Date   CESAREAN SECTION  03-15-2005   dr Audie Box  @[facility]    LAPAROSCOPY N/A 05/20/2019   Procedure: LAPAROSCOPY DIAGNOSTIC;  Surgeon: Dara Lords, MD;  Location: [facility];  Service: Gynecology;  Laterality: N/A;   WISDOM TOOTH EXTRACTION  2001     Current Outpatient Medications:    meloxicam (MOBIC) 15 MG tablet, Take 1 tablet (15 mg total) by mouth daily as needed for pain., Disp: 30 tablet, Rfl: 0   buPROPion (WELLBUTRIN SR) 200 MG 12 hr tablet, Take 1 tablet (200 mg total) by mouth 2 (two) times daily., Disp: 60 tablet, Rfl: 0   cetirizine (ZYRTEC) 10 MG tablet, Take 10 mg by mouth daily., Disp: , Rfl:    COLLAGEN PO, Take by mouth. 1 scoop daily, Disp: , Rfl:    MAGNESIUM PO, Take by mouth daily., Disp: , Rfl:    POTASSIUM  CHLORIDE PO, Take 640 mg by mouth., Disp: , Rfl:    triamcinolone cream (KENALOG) 0.1 %, Apply 1 Application topically 2 (two) times daily. Apply thin layer, cover with a thick hypoallergenic or non fragrant cream., Disp: 30 g, Rfl: 0   Vitamin D, Ergocalciferol, (DRISDOL) 1.25 MG (50000 UNIT) CAPS capsule, Take 1 capsule (50,000 Units total) by mouth every 7 (seven) days., Disp: 4 capsule, Rfl: 0  Allergies  Allergen Reactions   Cefotan [Cefotetan] Rash    Developed rash during IV infusion- dose completed -benadryl given, no other issues occured          Objective:  Physical Exam  General: AAO x3, NAD  Dermatological: Skin is warm, dry and supple bilateral.  There are no open sores, no preulcerative lesions, no rash or signs of infection present.  Vascular: Dorsalis Pedis artery and Posterior Tibial artery pedal pulses are 2/4 bilateral with immedate capillary fill time.  There is no pain with calf compression, swelling, warmth, erythema.   Neruologic: Grossly intact via light touch bilateral.   Musculoskeletal: Decreased range of motion  first MPJ with crepitation with range of motion.  Tenderness submetatarsal 2 but no pain on dorsal second MPJ.  There is no erythema present and only slight edema noted.  There is no pain with MPJ range of motion.  There is no erythema or tenderness.      Assessment:   Capsulitis second MTPJ, hallux rigidus     Plan:  -Treatment options discussed including all alternatives, risks, and complications -Etiology of symptoms were discussed -X-rays were obtained and reviewed.  Multiple views were obtained.  X-ray changes present the first MTPJ with joint space narrowing and spur noted.  No evidence of acute fracture. -I treated her symptoms are more probably more biomechanical in nature given hallux rigidus and transfer pressure.  We discussed shoes, good arch support.  Discussed metatarsal support help offload.  We can consider custom orthotics as  well to help more appropriately offload if needed. -We discussed general stretch exercises for her feet. -Prescribed mobic. Discussed side effects of the medication and directed to stop if any are to occur and call the office.   Return if symptoms worsen or fail to improve.  Vivi Barrack DPM

## 2023-09-22 NOTE — Patient Instructions (Signed)
For insets I like [employer], [employer], [employer]  -  Plantar Fasciitis (Heel Spur Syndrome) with Rehab The plantar fascia is a fibrous, ligament-like, soft-tissue structure that spans the bottom of the foot. Plantar fasciitis is a condition that causes pain in the foot due to inflammation of the tissue. SYMPTOMS  Pain and tenderness on the underneath side of the foot. Pain that worsens with standing or walking. CAUSES  Plantar fasciitis is caused by irritation and injury to the plantar fascia on the underneath side of the foot. Common mechanisms of injury include: Direct trauma to bottom of the foot. Damage to a small nerve that runs under the foot where the main fascia attaches to the heel bone. Stress placed on the plantar fascia due to bone spurs. RISK INCREASES WITH:  Activities that place stress on the plantar fascia (running, jumping, pivoting, or cutting). Poor strength and flexibility. Improperly fitted shoes. Tight calf muscles. Flat feet. Failure to warm-up properly before activity. Obesity. PREVENTION Warm up and stretch properly before activity. Allow for adequate recovery between workouts. Maintain physical fitness: Strength, flexibility, and endurance. Cardiovascular fitness. Maintain a health body weight. Avoid stress on the plantar fascia. Wear properly fitted shoes, including arch supports for individuals who have flat feet.  PROGNOSIS  If treated properly, then the symptoms of plantar fasciitis usually resolve without surgery. However, occasionally surgery is necessary.  RELATED COMPLICATIONS  Recurrent symptoms that may result in a chronic condition. Problems of the lower back that are caused by compensating for the injury, such as limping. Pain or weakness of the foot during push-off following surgery. Chronic inflammation, scarring, and partial or complete fascia tear, occurring more often from repeated injections.  TREATMENT  Treatment initially involves  the use of ice and medication to help reduce pain and inflammation. The use of strengthening and stretching exercises may help reduce pain with activity, especially stretches of the Achilles tendon. These exercises may be performed at home or with a therapist. Your caregiver may recommend that you use heel cups of arch supports to help reduce stress on the plantar fascia. Occasionally, corticosteroid injections are given to reduce inflammation. If symptoms persist for greater than 6 months despite non-surgical (conservative), then surgery may be recommended.   MEDICATION  If pain medication is necessary, then nonsteroidal anti-inflammatory medications, such as aspirin and ibuprofen, or other minor pain relievers, such as acetaminophen, are often recommended. Do not take pain medication within 7 days before surgery. Prescription pain relievers may be given if deemed necessary by your caregiver. Use only as directed and only as much as you need. Corticosteroid injections may be given by your caregiver. These injections should be reserved for the most serious cases, because they may only be given a certain number of times.  HEAT AND COLD Cold treatment (icing) relieves pain and reduces inflammation. Cold treatment should be applied for 10 to 15 minutes every 2 to 3 hours for inflammation and pain and immediately after any activity that aggravates your symptoms. Use ice packs or massage the area with a piece of ice (ice massage). Heat treatment may be used prior to performing the stretching and strengthening activities prescribed by your caregiver, physical therapist, or athletic trainer. Use a heat pack or soak the injury in warm water.  SEEK IMMEDIATE MEDICAL CARE IF: Treatment seems to offer no benefit, or the condition worsens. Any medications produce adverse side effects.  EXERCISES- RANGE OF MOTION (ROM) AND STRETCHING EXERCISES - Plantar Fasciitis (Heel Spur Syndrome) These exercises may  help  you when beginning to rehabilitate your injury. Your symptoms may resolve with or without further involvement from your physician, physical therapist or athletic trainer. While completing these exercises, remember:  Restoring tissue flexibility helps normal motion to return to the joints. This allows healthier, less painful movement and activity. An effective stretch should be held for at least 30 seconds. A stretch should never be painful. You should only feel a gentle lengthening or release in the stretched tissue.  RANGE OF MOTION - Toe Extension, Flexion Sit with your right / left leg crossed over your opposite knee. Grasp your toes and gently pull them back toward the top of your foot. You should feel a stretch on the bottom of your toes and/or foot. Hold this stretch for 10 seconds. Now, gently pull your toes toward the bottom of your foot. You should feel a stretch on the top of your toes and or foot. Hold this stretch for 10 seconds. Repeat  times. Complete this stretch 3 times per day.   RANGE OF MOTION - Ankle Dorsiflexion, Active Assisted Remove shoes and sit on a chair that is preferably not on a carpeted surface. Place right / left foot under knee. Extend your opposite leg for support. Keeping your heel down, slide your right / left foot back toward the chair until you feel a stretch at your ankle or calf. If you do not feel a stretch, slide your bottom forward to the edge of the chair, while still keeping your heel down. Hold this stretch for 10 seconds. Repeat 3 times. Complete this stretch 2 times per day.   STRETCH  Gastroc, Standing Place hands on wall. Extend right / left leg, keeping the front knee somewhat bent. Slightly point your toes inward on your back foot. Keeping your right / left heel on the floor and your knee straight, shift your weight toward the wall, not allowing your back to arch. You should feel a gentle stretch in the right / left calf. Hold this position  for 10 seconds. Repeat 3 times. Complete this stretch 2 times per day.  STRETCH  Soleus, Standing Place hands on wall. Extend right / left leg, keeping the other knee somewhat bent. Slightly point your toes inward on your back foot. Keep your right / left heel on the floor, bend your back knee, and slightly shift your weight over the back leg so that you feel a gentle stretch deep in your back calf. Hold this position for 10 seconds. Repeat 3 times. Complete this stretch 2 times per day.  STRETCH  Gastrocsoleus, Standing  Note: This exercise can place a lot of stress on your foot and ankle. Please complete this exercise only if specifically instructed by your caregiver.  Place the ball of your right / left foot on a step, keeping your other foot firmly on the same step. Hold on to the wall or a rail for balance. Slowly lift your other foot, allowing your body weight to press your heel down over the edge of the step. You should feel a stretch in your right / left calf. Hold this position for 10 seconds. Repeat this exercise with a slight bend in your right / left knee. Repeat 3 times. Complete this stretch 2 times per day.   STRENGTHENING EXERCISES - Plantar Fasciitis (Heel Spur Syndrome)  These exercises may help you when beginning to rehabilitate your injury. They may resolve your symptoms with or without further involvement from your physician, physical therapist or  Event organiser. While completing these exercises, remember:  Muscles can gain both the endurance and the strength needed for everyday activities through controlled exercises. Complete these exercises as instructed by your physician, physical therapist or athletic trainer. Progress the resistance and repetitions only as guided.  STRENGTH - Towel Curls Sit in a chair positioned on a non-carpeted surface. Place your foot on a towel, keeping your heel on the floor. Pull the towel toward your heel by only curling your toes.  Keep your heel on the floor. Repeat 3 times. Complete this exercise 2 times per day.  STRENGTH - Ankle Inversion Secure one end of a rubber exercise band/tubing to a fixed object (table, pole). Loop the other end around your foot just before your toes. Place your fists between your knees. This will focus your strengthening at your ankle. Slowly, pull your big toe up and in, making sure the band/tubing is positioned to resist the entire motion. Hold this position for 10 seconds. Have your muscles resist the band/tubing as it slowly pulls your foot back to the starting position. Repeat 3 times. Complete this exercises 2 times per day.  Document Released: 07/25/2005 Document Revised: 10/17/2011 Document Reviewed: 11/06/2008 [facility] Patient Information 2014 [place], [place].

## 2023-09-29 ENCOUNTER — Other Ambulatory Visit (INDEPENDENT_AMBULATORY_CARE_PROVIDER_SITE_OTHER): Payer: Self-pay | Admitting: Family Medicine

## 2023-09-29 DIAGNOSIS — E559 Vitamin D deficiency, unspecified: Secondary | ICD-10-CM

## 2023-09-29 DIAGNOSIS — F3289 Other specified depressive episodes: Secondary | ICD-10-CM

## 2023-10-10 ENCOUNTER — Ambulatory Visit (INDEPENDENT_AMBULATORY_CARE_PROVIDER_SITE_OTHER): Payer: 59 | Admitting: Family Medicine

## 2023-10-10 ENCOUNTER — Encounter (INDEPENDENT_AMBULATORY_CARE_PROVIDER_SITE_OTHER): Payer: Self-pay | Admitting: Family Medicine

## 2023-10-10 VITALS — BP 132/84 | HR 71 | Temp 97.7°F | Ht 66.0 in | Wt 219.0 lb

## 2023-10-10 DIAGNOSIS — R7303 Prediabetes: Secondary | ICD-10-CM

## 2023-10-10 DIAGNOSIS — Z6835 Body mass index (BMI) 35.0-35.9, adult: Secondary | ICD-10-CM

## 2023-10-10 DIAGNOSIS — F5089 Other specified eating disorder: Secondary | ICD-10-CM

## 2023-10-10 DIAGNOSIS — F3289 Other specified depressive episodes: Secondary | ICD-10-CM

## 2023-10-10 DIAGNOSIS — E559 Vitamin D deficiency, unspecified: Secondary | ICD-10-CM

## 2023-10-10 DIAGNOSIS — E669 Obesity, unspecified: Secondary | ICD-10-CM | POA: Diagnosis not present

## 2023-10-10 MED ORDER — METFORMIN HCL 500 MG PO TABS: 500.0000 mg | ORAL_TABLET | Freq: Every day | ORAL | 0 refills | Status: DC

## 2023-10-10 MED ORDER — VITAMIN D (ERGOCALCIFEROL) 1.25 MG (50000 UNIT) PO CAPS: 50000.0000 [IU] | ORAL_CAPSULE | ORAL | 0 refills | Status: DC

## 2023-10-10 MED ORDER — BUPROPION HCL ER (SR) 200 MG PO TB12: 200.0000 mg | ORAL_TABLET | Freq: Two times a day (BID) | ORAL | 0 refills | Status: DC

## 2023-10-10 NOTE — Progress Notes (Signed)
 Office: [phone]  /  Fax: [phone]  WEIGHT SUMMARY AND BIOMETRICS  Anthropometric Measurements Height: 5\' 6"  (1.676 m) Weight: 219 lb (99.3 kg) BMI (Calculated): 35.36 Weight at Last Visit: 217 lb Weight Lost Since Last Visit: 0 lb Weight Gained Since Last Visit: 2 lb Starting Weight: 232 lb Total Weight Loss (lbs): 13 lb (5.897 kg)   Body Composition  Body Fat %: 40.9 % Fat Mass (lbs): 89.8 lbs Muscle Mass (lbs): 123.2 lbs Total Body Water (lbs): 86 lbs Visceral Fat Rating : 11   Other Clinical Data Fasting: no Labs: no Today's Visit #: 37 Starting Date: 04/28/21    Chief Complaint: OBESITY    History of Present Illness   Sherry Marquez is a 53 year old female with obesity who presents for treatment monitoring and progress evaluation.  She has gained two pounds over the past month. She follows her category three eating plan approximately 75% of the time and engages in daily walking for 30 minutes. She has a history of prediabetes, vitamin D insufficiency, and emotional eating behaviors. Some of her weight gain is muscle, however.  Her most recent hemoglobin A1c has improved from 6.0 to 5.7. She started taking metformin shortly after her last visit, which has helped reduce her hunger in the mornings, but she experiences less effect later in the day. Initial side effects of diarrhea have decreased in frequency. She is not currently on any other medications for prediabetes.  She is taking Wellbutrin to manage her emotional eating behaviors. Her emotional state has been more positive recently, which she attributes to the spring weather and a lack of recent personal losses. She describes her eating as being influenced by her emotions, stating, 'I eat when I'm happy, I eat when I'm sad, I eat when I'm bored.'  She is on prescription vitamin D for her vitamin D insufficiency. There are no new symptoms or changes reported in this area.  In terms of her social  history, she mentions having a dog, which encourages her to maintain her walking routine. She also discusses her routine being disrupted, which affects her planning and adherence to her health goals.          PHYSICAL EXAM:  Blood pressure 132/84, pulse 71, temperature 97.7 F (36.5 C), height 5\' 6"  (1.676 m), weight 219 lb (99.3 kg), SpO2 99%. Body mass index is 35.35 kg/m.  DIAGNOSTIC DATA REVIEWED:  BMET    Component Value Date/Time   NA 140 04/18/2023 0747   K 5.1 04/18/2023 0747   CL 101 04/18/2023 0747   CO2 24 04/18/2023 0747   GLUCOSE 106 (H) 04/18/2023 0747   GLUCOSE 105 (H) 02/24/2021 0745   BUN 24 04/18/2023 0747   CREATININE 0.96 04/18/2023 0747   CREATININE 1.03 12/06/2019 0930   CALCIUM 9.9 04/18/2023 0747   GFRNONAA >60 09/02/2020 1309   Lab Results  Component Value Date   HGBA1C 5.7 (H) 04/18/2023   HGBA1C 5.7 10/29/2013   Lab Results  Component Value Date   INSULIN 8.8 04/18/2023   INSULIN 12.2 04/28/2021   Lab Results  Component Value Date   TSH 2.440 09/13/2022   CBC    Component Value Date/Time   WBC 4.6 02/24/2021 0745   RBC 4.89 02/24/2021 0745   HGB 15.0 02/24/2021 0745   HGB 12.7 01/31/2019 1015   HCT 44.0 02/24/2021 0745   HCT 38.0 01/31/2019 1015   PLT 241.0 02/24/2021 0745   PLT 603 (H) 01/31/2019 1015  MCV 89.8 02/24/2021 0745   MCV 91 01/31/2019 1015   MCH 30.4 09/07/2020 0850   MCHC 34.1 02/24/2021 0745   RDW 14.8 02/24/2021 0745   RDW 12.5 01/31/2019 1015   Iron Studies    Component Value Date/Time   FERRITIN 58 09/05/2020 0241   FERRITIN 265 (H) 01/31/2019 1015   Lipid Panel     Component Value Date/Time   CHOL 249 (H) 04/18/2023 0747   TRIG 104 04/18/2023 0747   HDL 58 04/18/2023 0747   CHOLHDL 4 02/24/2021 0745   VLDL 27.6 02/24/2021 0745   LDLCALC 173 (H) 04/18/2023 0747   LDLCALC 152 (H) 12/06/2019 0930   Hepatic Function Panel     Component Value Date/Time   PROT 7.1 04/18/2023 0747   ALBUMIN 4.4  04/18/2023 0747   AST 18 04/18/2023 0747   ALT 21 04/18/2023 0747   ALKPHOS 116 04/18/2023 0747   BILITOT 0.4 04/18/2023 0747      Component Value Date/Time   TSH 2.440 09/13/2022 0930   Nutritional Lab Results  Component Value Date   VD25OH 50.7 04/18/2023   VD25OH 32.9 09/13/2022   VD25OH 30.6 02/28/2022     Assessment and Plan    Obesity She has gained two pounds in the last month, likely due to muscle gain from walking and previous strengthening exercises. She adheres to her category three eating plan approximately 75% of the time and engages in 30 minutes of daily walking. She has reduced weightlifting but is motivated to resume. Encouragement to continue walking and consider adding strengthening exercises was provided. - Refill Bydureon - Encourage continuation of daily walking - Encourage resumption of strengthening exercises  Prediabetes Her hemoglobin A1c improved from 6.0 to 5.7. She is not currently on medications for prediabetes but has started taking metformin. She experiences side effects such as diarrhea, particularly in the morning, indicating sensitivity to the medication. Sensitivity to side effects may also indicate sensitivity to the benefits of metformin. Switching metformin intake to lunchtime may improve coverage in the afternoon when hunger is more challenging. - Move metformin intake to lunchtime to improve coverage in the afternoon - Monitor for side effects and adjust as needed  Emotional eating behaviors She is on Wellbutrin for emotional eating behaviors. She reports feeling happier recently, which may be contributing to her eating habits. Discussion about the possibility of weaning off Wellbutrin in the future if her emotional state remains stable was conducted. - Consider weaning off Wellbutrin in the future if emotional state remains stable  Vitamin D insufficiency She is on prescription vitamin D for her deficiency. Continuation of this treatment  is planned. - Refill vitamin D prescription   follow up 4 weeks        She was informed of the importance of frequent follow up visits to maximize her success with intensive lifestyle modifications for her multiple health conditions.    Quillian Quince, MD

## 2023-10-12 ENCOUNTER — Ambulatory Visit: Payer: 59 | Admitting: Psychology

## 2023-10-12 DIAGNOSIS — F4321 Adjustment disorder with depressed mood: Secondary | ICD-10-CM

## 2023-10-12 NOTE — Progress Notes (Signed)
 [facility] Counselor/Therapist Progress Note  Patient ID: Sherry Marquez, MRN: [id number],    Date: 10/12/2023  Time Spent: 10:00am-10:55am   55 minutes   Treatment Type: Individual Therapy  Reported Symptoms: stress  Mental Status Exam: Appearance:  Casual     Behavior: Appropriate  Motor: Normal  Speech/Language:  Normal Rate  Affect: Appropriate  Mood: normal  Thought process: normal  Thought content:   WNL  Sensory/Perceptual disturbances:   WNL  Orientation: oriented to person, place, time/date, and situation  Attention: Good  Concentration: Good  Memory: Immediate;   Good  Fund of knowledge:  Good  Insight:   Good  Judgment:  Good  Impulse Control: Good   Risk Assessment: Danger to Self:  No Self-injurious Behavior: No Danger to Others: No Duty to Warn:no Physical Aggression / Violence:No  Access to Firearms a concern: No  Gang Involvement:No   Subjective: Pt present for face-to-face individual therapy via video.  Pt consents to telehealth video session and is aware of limitations and benefits of virtual sessions.   Location of pt: home Location of therapist: home office.   Pt talked about feeling better and making more effort to take better care of herself.  She has felt more productive and happy in general. Pt is still working on improving her eating habits.   Pt talked about her son Sherry Marquez having his girlfriend to their house.   It went well so pt is feeling less anxious about that.   Pt talked about making a new friend.  They connected for lunch and it was good except the friend talked about politics more than pt was comfortable with.  Addressed how pt can navigate the political conversations. Pt talked about visiting her parents last weekend and had a nice visit.    Pt talked about her relationship with her husband Sherry Marquez.  Pt is working on communicating more effectively with Sherry Marquez.   She is aware she tends to make assumptions that are often not  accurate.  Worked on Sport and exercise psychologist.  Worked on self care strategies.  Pt is going to take a painting class.  Provided supportive therapy.    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   Pt participated in setting treatment goals.   Plan to meet monthly.   Pt agrees with treatment plan.  Treatment Plan(treatment plan target date 07/20/2024) Client Abilities/Strengths  Pt is bright, engaging, and motivated for therapy.   Client Treatment Preferences  Individual therapy.  Client Statement of Needs  Improve coping skills.  Symptoms  Depressed or irritable mood. Low self-esteem. Unresolved grief issues.   Problems Addressed  Unipolar Depression Goals 1. Alleviate depressive symptoms and return to previous level of effective functioning. 2. Appropriately grieve the loss in order to normalize mood and to return to previously adaptive level of functioning. Objective Learn and implement behavioral strategies to overcome depression. Target Date: 2024-07-20 Frequency: monthly  Progress: 50 Modality: individual  Related Interventions Engage the client in "behavioral activation," increasing his/her activity level and contact with sources of reward, while identifying processes that inhibit activation.  Use behavioral techniques such as instruction, rehearsal, role-playing, role reversal, as needed, to facilitate activity in the client's daily life; reinforce success. Assist the client in developing skills that increase the likelihood of deriving pleasure from behavioral activation (e.g., assertiveness skills, developing an exercise plan, less internal/more external focus, increased social involvement); reinforce success. Objective Identify important  people in life, past and present, and describe the quality, good and poor, of those relationships. Target Date: 2024-07-20 Frequency: monthly   Progress: 50 Modality: individual  Related Interventions Conduct Interpersonal Therapy beginning with the assessment of the client's "interpersonal inventory" of important past and present relationships; develop a case formulation linking depression to grief, interpersonal role disputes, role transitions, and/or interpersonal deficits). Objective Learn and implement problem-solving and decision-making skills. Target Date: 2024-07-20 Frequency: monthly  Progress: 50 Modality: individual  Related Interventions Conduct Problem-Solving Therapy using techniques such as psychoeducation, modeling, and role-playing to teach client problem-solving skills (i.e., defining a problem specifically, generating possible solutions, evaluating the pros and cons of each solution, selecting and implementing a plan of action, evaluating the efficacy of the plan, accepting or revising the plan); role-play application of the problem-solving skill to a real life issue. Encourage in the client the development of a positive problem orientation in which problems and solving them are viewed as a natural part of life and not something to be feared, despaired, or avoided. 3. Develop healthy interpersonal relationships that lead to the alleviation and help prevent the relapse of depression. 4. Develop healthy thinking patterns and beliefs about self, others, and the world that lead to the alleviation and help prevent the relapse of depression. 5. Recognize, accept, and cope with feelings of depression. Diagnosis F43.21 Conditions For Discharge Achievement of treatment goals and objectives   Salomon Fick, LCSW

## 2023-10-31 ENCOUNTER — Encounter: Payer: Self-pay | Admitting: Family Medicine

## 2023-10-31 ENCOUNTER — Ambulatory Visit (INDEPENDENT_AMBULATORY_CARE_PROVIDER_SITE_OTHER): Admitting: Family Medicine

## 2023-10-31 VITALS — BP 134/70 | HR 67 | Temp 98.1°F | Ht 65.25 in | Wt 225.6 lb

## 2023-10-31 DIAGNOSIS — Z0000 Encounter for general adult medical examination without abnormal findings: Secondary | ICD-10-CM

## 2023-10-31 NOTE — Patient Instructions (Addendum)
 Berberbine -- 500 or 600 mg 30 minutes before each meal  Also you may try chromium supplement  Health Maintenance, Female Adopting a healthy lifestyle and getting preventive care are important in promoting health and wellness. Ask your health care provider about: The right schedule for you to have regular tests and exams. Things you can do on your own to prevent diseases and keep yourself healthy. What should I know about diet, weight, and exercise? Eat a healthy diet  Eat a diet that includes plenty of vegetables, fruits, low-fat dairy products, and lean protein. Do not eat a lot of foods that are high in solid fats, added sugars, or sodium. Maintain a healthy weight Body mass index (BMI) is used to identify weight problems. It estimates body fat based on height and weight. Your health care provider can help determine your BMI and help you achieve or maintain a healthy weight. Get regular exercise Get regular exercise. This is one of the most important things you can do for your health. Most adults should: Exercise for at least 150 minutes each week. The exercise should increase your heart rate and make you sweat (moderate-intensity exercise). Do strengthening exercises at least twice a week. This is in addition to the moderate-intensity exercise. Spend less time sitting. Even light physical activity can be beneficial. Watch cholesterol and blood lipids Have your blood tested for lipids and cholesterol at 52 years of age, then have this test every 5 years. Have your cholesterol levels checked more often if: Your lipid or cholesterol levels are high. You are older than 52 years of age. You are at high risk for heart disease. What should I know about cancer screening? Depending on your health history and family history, you may need to have cancer screening at various ages. This may include screening for: Breast cancer. Cervical cancer. Colorectal cancer. Skin cancer. Lung  cancer. What should I know about heart disease, diabetes, and high blood pressure? Blood pressure and heart disease High blood pressure causes heart disease and increases the risk of stroke. This is more likely to develop in people who have high blood pressure readings or are overweight. Have your blood pressure checked: Every 3-5 years if you are 54-34 years of age. Every year if you are 36 years old or older. Diabetes Have regular diabetes screenings. This checks your fasting blood sugar level. Have the screening done: Once every three years after age 70 if you are at a normal weight and have a low risk for diabetes. More often and at a younger age if you are overweight or have a high risk for diabetes. What should I know about preventing infection? Hepatitis B If you have a higher risk for hepatitis B, you should be screened for this virus. Talk with your health care provider to find out if you are at risk for hepatitis B infection. Hepatitis C Testing is recommended for: Everyone born from [birth year] through 1964. Anyone with known risk factors for hepatitis C. Sexually transmitted infections (STIs) Get screened for STIs, including gonorrhea and chlamydia, if: You are sexually active and are younger than 52 years of age. You are older than 52 years of age and your health care provider tells you that you are at risk for this type of infection. Your sexual activity has changed since you were last screened, and you are at increased risk for chlamydia or gonorrhea. Ask your health care provider if you are at risk. Ask your health care provider about whether you  are at high risk for HIV. Your health care provider may recommend a prescription medicine to help prevent HIV infection. If you choose to take medicine to prevent HIV, you should first get tested for HIV. You should then be tested every 3 months for as long as you are taking the medicine. Pregnancy If you are about to stop having your  period (premenopausal) and you may become pregnant, seek counseling before you get pregnant. Take 400 to 800 micrograms (mcg) of folic acid every day if you become pregnant. Ask for birth control (contraception) if you want to prevent pregnancy. Osteoporosis and menopause Osteoporosis is a disease in which the bones lose minerals and strength with aging. This can result in bone fractures. If you are 67 years old or older, or if you are at risk for osteoporosis and fractures, ask your health care provider if you should: Be screened for bone loss. Take a calcium or vitamin D supplement to lower your risk of fractures. Be given hormone replacement therapy (HRT) to treat symptoms of menopause. Follow these instructions at home: Alcohol use Do not drink alcohol if: Your health care provider tells you not to drink. You are pregnant, may be pregnant, or are planning to become pregnant. If you drink alcohol: Limit how much you have to: 0-1 drink a day. Know how much alcohol is in your drink. In the [place] one drink equals one 12 oz bottle of beer (355 mL), one 5 oz glass of wine (148 mL), or one 1 oz glass of hard liquor (44 mL). Lifestyle Do not use any products that contain nicotine or tobacco. These products include cigarettes, chewing tobacco, and vaping devices, such as e-cigarettes. If you need help quitting, ask your health care provider. Do not use street drugs. Do not share needles. Ask your health care provider for help if you need support or information about quitting drugs. General instructions Schedule regular health, dental, and eye exams. Stay current with your vaccines. Tell your health care provider if: You often feel depressed. You have ever been abused or do not feel safe at home. Summary Adopting a healthy lifestyle and getting preventive care are important in promoting health and wellness. Follow your health care provider's instructions about healthy diet, exercising, and  getting tested or screened for diseases. Follow your health care provider's instructions on monitoring your cholesterol and blood pressure. This information is not intended to replace advice given to you by your health care provider. Make sure you discuss any questions you have with your health care provider. Document Revised: 12/14/2020 Document Reviewed: 12/14/2020 Elsevier Patient Education  2024 [employer].

## 2023-10-31 NOTE — Progress Notes (Signed)
 Complete physical exam  Patient: Sherry Marquez   DOB: Aug 10, 1970   53 y.o. Female  MRN: [id number]  Subjective:    Chief Complaint  Patient presents with   Annual Exam    Sherry Marquez is a 53 y.o. female who presents today for a complete physical exam. She reports consuming a  high protein, high veggies, lower carbohydrate  diet. Takes magnesium, potassium, selenium supplements.  Home exercise routine includes walking 2 hrs per week. Is also weight lifting 3 times a week. She generally feels well. She reports sleeping fairly well. Occasional hot flashes at night, occasional nighttime awakenings. Otherwise feels rested in the morning. She does not have additional problems to discuss today.    Most recent fall risk assessment:    10/27/2022   10:32 AM  Fall Risk   Falls in the past year? 0  Number falls in past yr: 0  Injury with Fall? 0     Most recent depression screenings:    10/31/2023    9:52 AM 09/07/2022   11:47 AM  PHQ 2/9 Scores  PHQ - 2 Score 0 0  PHQ- 9 Score 0 6    Vision:Within last year and no vision issues and Dental: No current dental problems and Receives regular dental care  Patient Active Problem List   Diagnosis Date Noted   Pure hypercholesterolemia 04/18/2023   BMI 35.0-35.9,adult 09/13/2022   Beginning BMI 37.45 09/13/2022   Depression 08/09/2022   Elevated blood pressure reading without diagnosis of hypertension 08/09/2022   Abnormal craving 05/23/2022   Pre-diabetes 05/23/2022   Class 2 severe obesity with serious comorbidity and body mass index (BMI) of 37.0 to 37.9 in adult (HCC) 05/23/2022   Vitamin D deficiency 01/03/2022   Other hyperlipidemia 01/03/2022   At risk for depression 01/03/2022   Vitamin D insufficiency 04/29/2021   Acute deep vein thrombosis (DVT) of proximal vein of left lower extremity (HCC) 10/05/2020   Pneumonia due to COVID-19 virus 09/02/2020   Dyslipidemia 09/02/2020   Pulmonary embolism (HCC) 09/02/2020   Acute  respiratory failure with hypoxia (HCC) 09/02/2020   Class 2 obesity 09/02/2020   Rosacea    Right ovarian cyst    Hemorrhagic cyst of right ovary 01/30/2019   Abdominal pain 01/29/2019   Urinary tract infection symptoms 01/26/2019   Left foot pain 10/29/2013   Seasonal allergies 10/29/2013   Depressed mood 10/29/2013      Patient Care Team: Karie Georges, MD as PCP - General (Family Medicine) Specialists, Dermatology (Dermatology) Genia Del, MD as Consulting Physician (Obstetrics and Gynecology)   Outpatient Medications Prior to Visit  Medication Sig   buPROPion (WELLBUTRIN SR) 200 MG 12 hr tablet Take 1 tablet (200 mg total) by mouth 2 (two) times daily.   cetirizine (ZYRTEC) 10 MG tablet Take 10 mg by mouth daily.   COLLAGEN PO Take by mouth. 1 scoop daily   MAGNESIUM PO Take by mouth daily.   meloxicam (MOBIC) 15 MG tablet Take 1 tablet (15 mg total) by mouth daily as needed for pain.   metFORMIN (GLUCOPHAGE) 500 MG tablet Take 1 tablet (500 mg total) by mouth daily.   POTASSIUM CHLORIDE PO Take 640 mg by mouth.   triamcinolone cream (KENALOG) 0.1 % Apply 1 Application topically 2 (two) times daily. Apply thin layer, cover with a thick hypoallergenic or non fragrant cream.   Vitamin D, Ergocalciferol, (DRISDOL) 1.25 MG (50000 UNIT) CAPS capsule Take 1 capsule (50,000 Units total) by mouth  every 7 (seven) days.   No facility-administered medications prior to visit.    Review of Systems  HENT:  Negative for hearing loss.   Eyes:  Negative for blurred vision.  Respiratory:  Negative for shortness of breath.   Cardiovascular:  Negative for chest pain.  Gastrointestinal: Negative.   Genitourinary: Negative.   Musculoskeletal:  Negative for back pain.  Neurological:  Negative for headaches.  Psychiatric/Behavioral:  Negative for depression.        Objective:     BP 134/70   Pulse 67   Temp 98.1 F (36.7 C) (Oral)   Ht 5' 5.25" (1.657 m)   Wt 225 lb 9.6  oz (102.3 kg)   SpO2 98%   BMI 37.25 kg/m    Physical Exam Vitals reviewed.  Constitutional:      Appearance: Normal appearance. She is well-groomed. She is obese.  HENT:     Right Ear: Tympanic membrane and ear canal normal.     Left Ear: Tympanic membrane and ear canal normal.     Mouth/Throat:     Mouth: Mucous membranes are moist.     Pharynx: No posterior oropharyngeal erythema.  Eyes:     Conjunctiva/sclera: Conjunctivae normal.  Neck:     Thyroid: No thyromegaly.  Cardiovascular:     Rate and Rhythm: Normal rate and regular rhythm.     Pulses: Normal pulses.     Heart sounds: S1 normal and S2 normal.  Pulmonary:     Effort: Pulmonary effort is normal.     Breath sounds: Normal breath sounds and air entry.  Abdominal:     General: Abdomen is flat. Bowel sounds are normal.     Palpations: Abdomen is soft.  Musculoskeletal:     Right lower leg: No edema.     Left lower leg: No edema.  Lymphadenopathy:     Cervical: No cervical adenopathy.  Neurological:     Mental Status: She is alert and oriented to person, place, and time. Mental status is at baseline.     Gait: Gait is intact.  Psychiatric:        Mood and Affect: Mood and affect normal.        Speech: Speech normal.        Behavior: Behavior normal.        Judgment: Judgment normal.      No results found for any visits on 10/31/23.     Assessment & Plan:    Routine Health Maintenance and Physical Exam  Immunization History  Administered Date(s) Administered   Influenza,inj,Quad PF,6+ Mos 05/21/2018   Influenza-Unspecified 06/22/2014, 05/09/2015, 05/21/2018    Health Maintenance  Topic Date Due   COVID-19 Vaccine (1) Never done   INFLUENZA VACCINE  11/06/2023 (Originally 03/09/2023)   Zoster Vaccines- Shingrix (1 of 2) 01/31/2024 (Originally 07/04/1991)   DTaP/Tdap/Td (1 - Tdap) 10/30/2024 (Originally 07/04/1991)   Hepatitis C Screening  10/30/2024 (Originally 07/03/1990)   MAMMOGRAM  11/07/2023    Fecal DNA (Cologuard)  09/22/2024   Cervical Cancer Screening (HPV/Pap Cotest)  10/26/2026   HIV Screening  Completed   HPV VACCINES  Aged Out    Discussed health benefits of physical activity, and encouraged her to engage in regular exercise appropriate for her age and condition.  Routine general medical examination at a health care facility  Normal physical exam findings. I counseled the patient on the shingles vaccination series, including risks/benefits. I also counseled the patient on the use of herbal supplements to help  with insulin sensitivity. She is seeing the weight loss center for this and will be getting bloodwork with them next month.  Health maintenance reviewed, she is UTD on her health maintenance measures. Handouts given on healthy eating and exercise.   Return in 1 year (on 10/30/2024).     Karie Georges, MD

## 2023-11-08 ENCOUNTER — Other Ambulatory Visit (INDEPENDENT_AMBULATORY_CARE_PROVIDER_SITE_OTHER): Payer: Self-pay | Admitting: Family Medicine

## 2023-11-09 ENCOUNTER — Ambulatory Visit: Payer: 59 | Admitting: Psychology

## 2023-11-09 DIAGNOSIS — F4321 Adjustment disorder with depressed mood: Secondary | ICD-10-CM | POA: Diagnosis not present

## 2023-11-09 NOTE — Progress Notes (Signed)
 [name] Counselor/Therapist Progress Note  Patient ID: Sherry Marquez, MRN: [id number],    Date: 11/09/2023  Time Spent: 10:00am-10:50am   50 minutes   Treatment Type: Individual Therapy  Reported Symptoms: stress  Mental Status Exam: Appearance:  Casual     Behavior: Appropriate  Motor: Normal  Speech/Language:  Normal Rate  Affect: Appropriate  Mood: normal  Thought process: normal  Thought content:   WNL  Sensory/Perceptual disturbances:   WNL  Orientation: oriented to person, place, time/date, and situation  Attention: Good  Concentration: Good  Memory: Immediate;   Good  Fund of knowledge:  Good  Insight:   Good  Judgment:  Good  Impulse Control: Good   Risk Assessment: Danger to Self:  No Self-injurious Behavior: No Danger to Others: No Duty to Warn:no Physical Aggression / Violence:No  Access to Firearms a concern: No  Gang Involvement:No   Subjective: Pt present for face-to-face individual therapy via video.  Pt consents to telehealth video session and is aware of limitations and benefits of virtual sessions.   Location of pt: home Location of therapist: home office.   Pt talked about feeling good overall.  Pt states her mood has been improved and she is making progress with improving her eating habits.  She is also exercising more regularly.   Pt is working on having a more positive mindset.  Pt is being more productive in her house.   Pt talked about her son Sherry Marquez breaking up with his girlfriend.   He did not want to talk to pt about what happened other than saying that the girl was seeming "like a gangster".    Addressed that it seems like her son is developing standards for himself.  Addressed pt's concerns about her son and how to communicate effectively with him. Pt talked about her relationship with her husband Sherry Marquez.  Pt is working on communicating more effectively with Sherry Marquez.   She is aware she tends to make assumptions that are often not  accurate.  Worked on Sport and exercise psychologist.  Worked on self care strategies.   Provided supportive therapy.    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   Pt participated in setting treatment goals.   Plan to meet monthly.   Pt agrees with treatment plan.  Treatment Plan(treatment plan target date 07/20/2024) Client Abilities/Strengths  Pt is bright, engaging, and motivated for therapy.   Client Treatment Preferences  Individual therapy.  Client Statement of Needs  Improve coping skills.  Symptoms  Depressed or irritable mood. Low self-esteem. Unresolved grief issues.   Problems Addressed  Unipolar Depression Goals 1. Alleviate depressive symptoms and return to previous level of effective functioning. 2. Appropriately grieve the loss in order to normalize mood and to return to previously adaptive level of functioning. Objective Learn and implement behavioral strategies to overcome depression. Target Date: 2024-07-20 Frequency: monthly  Progress: 50 Modality: individual  Related Interventions Engage the client in "behavioral activation," increasing his/her activity level and contact with sources of reward, while identifying processes that inhibit activation.  Use behavioral techniques such as instruction, rehearsal, role-playing, role reversal, as needed, to facilitate activity in the client's daily life; reinforce success. Assist the client in developing skills that increase the likelihood of deriving pleasure from behavioral activation (e.g., assertiveness skills, developing an exercise plan, less internal/more external focus, increased social involvement); reinforce success. Objective Identify important people in life, past and present, and  describe the quality, good and poor, of those relationships. Target Date: 2024-07-20 Frequency: monthly  Progress: 50 Modality: individual  Related  Interventions Conduct Interpersonal Therapy beginning with the assessment of the client's "interpersonal inventory" of important past and present relationships; develop a case formulation linking depression to grief, interpersonal role disputes, role transitions, and/or interpersonal deficits). Objective Learn and implement problem-solving and decision-making skills. Target Date: 2024-07-20 Frequency: monthly  Progress: 50 Modality: individual  Related Interventions Conduct Problem-Solving Therapy using techniques such as psychoeducation, modeling, and role-playing to teach client problem-solving skills (i.e., defining a problem specifically, generating possible solutions, evaluating the pros and cons of each solution, selecting and implementing a plan of action, evaluating the efficacy of the plan, accepting or revising the plan); role-play application of the problem-solving skill to a real life issue. Encourage in the client the development of a positive problem orientation in which problems and solving them are viewed as a natural part of life and not something to be feared, despaired, or avoided. 3. Develop healthy interpersonal relationships that lead to the alleviation and help prevent the relapse of depression. 4. Develop healthy thinking patterns and beliefs about self, others, and the world that lead to the alleviation and help prevent the relapse of depression. 5. Recognize, accept, and cope with feelings of depression. Diagnosis F43.21 Conditions For Discharge Achievement of treatment goals and objectives   Salomon Fick, LCSW

## 2023-11-13 ENCOUNTER — Ambulatory Visit (INDEPENDENT_AMBULATORY_CARE_PROVIDER_SITE_OTHER): Payer: 59 | Admitting: Family Medicine

## 2023-11-13 VITALS — BP 135/82 | HR 60 | Temp 98.0°F | Ht 66.0 in | Wt 215.0 lb

## 2023-11-13 DIAGNOSIS — F5089 Other specified eating disorder: Secondary | ICD-10-CM

## 2023-11-13 DIAGNOSIS — R7303 Prediabetes: Secondary | ICD-10-CM | POA: Diagnosis not present

## 2023-11-13 DIAGNOSIS — E785 Hyperlipidemia, unspecified: Secondary | ICD-10-CM | POA: Diagnosis not present

## 2023-11-13 DIAGNOSIS — E782 Mixed hyperlipidemia: Secondary | ICD-10-CM

## 2023-11-13 DIAGNOSIS — E559 Vitamin D deficiency, unspecified: Secondary | ICD-10-CM | POA: Diagnosis not present

## 2023-11-13 DIAGNOSIS — F3289 Other specified depressive episodes: Secondary | ICD-10-CM

## 2023-11-13 DIAGNOSIS — Z6834 Body mass index (BMI) 34.0-34.9, adult: Secondary | ICD-10-CM

## 2023-11-13 DIAGNOSIS — E669 Obesity, unspecified: Secondary | ICD-10-CM

## 2023-11-13 MED ORDER — METFORMIN HCL 500 MG PO TABS: 500.0000 mg | ORAL_TABLET | Freq: Every day | ORAL | 0 refills | Status: DC

## 2023-11-13 MED ORDER — VITAMIN D (ERGOCALCIFEROL) 1.25 MG (50000 UNIT) PO CAPS: 50000.0000 [IU] | ORAL_CAPSULE | ORAL | 0 refills | Status: DC

## 2023-11-13 MED ORDER — BUPROPION HCL ER (SR) 200 MG PO TB12: 200.0000 mg | ORAL_TABLET | Freq: Two times a day (BID) | ORAL | 0 refills | Status: DC

## 2023-11-13 NOTE — Progress Notes (Signed)
 Office: [phone]  /  Fax: [phone]  WEIGHT SUMMARY AND BIOMETRICS  Anthropometric Measurements Height: 5\' 6"  (1.676 m) Weight: 215 lb (97.5 kg) BMI (Calculated): 34.72 Weight at Last Visit: 219 lb Weight Lost Since Last Visit: 4 lb Weight Gained Since Last Visit: 0 Starting Weight: 232 lb   Body Composition  Body Fat %: 39.8 % Fat Mass (lbs): 85.8 lbs Muscle Mass (lbs): 123.2 lbs Total Body Water (lbs): 85.8 lbs Visceral Fat Rating : 10   Other Clinical Data Fasting: no Labs: no Today's Visit #: 73 Starting Date: 04/28/21    Chief Complaint: OBESITY    History of Present Illness Sherry Marquez is a 53 year old female who presents to discuss her obesity treatment plan and track her progress.  She is adhering to her category three eating plan 85% of the time and engages in walking and strengthening exercises for about 30 minutes most days of the week. She has lost four pounds in the last month. She is mindful of her eating habits, avoiding nuts and chocolate chips, and focusing on protein intake, consuming over 100 grams of protein most days. She has been weightlifting three to four times a week and feels her muscles getting stronger. She also enjoys doing yard work, which contributes to her physical activity.  She has a history of emotional eating behaviors, which is being managed with Wellbutrin. She feels stable and requests a refill of her medication. She mentioned considering going off Wellbutrin but decided to continue as she is in a good place currently.  She has a history of prediabetes and is on metformin. She is doing well with diet and exercise and requests a refill of her medication. She has been taking her metformin in the middle of the day, which she finds works better for her.  She has a history of vitamin D deficiency and is currently on prescription vitamin D, for which she requests a refill.  She mentions being tired due to fasting for labs and  has not had coffee, which she finds challenging. She also discusses her strategies for managing potential dietary challenges during Easter, such as delaying the purchase of treats to avoid overconsumption.      PHYSICAL EXAM:  Blood pressure 135/82, pulse 60, temperature 98 F (36.7 C), height 5\' 6"  (1.676 m), weight 215 lb (97.5 kg), SpO2 100%. Body mass index is 34.7 kg/m.  DIAGNOSTIC DATA REVIEWED:  BMET    Component Value Date/Time   NA 140 04/18/2023 0747   K 5.1 04/18/2023 0747   CL 101 04/18/2023 0747   CO2 24 04/18/2023 0747   GLUCOSE 106 (H) 04/18/2023 0747   GLUCOSE 105 (H) 02/24/2021 0745   BUN 24 04/18/2023 0747   CREATININE 0.96 04/18/2023 0747   CREATININE 1.03 12/06/2019 0930   CALCIUM 9.9 04/18/2023 0747   GFRNONAA >60 09/02/2020 1309   Lab Results  Component Value Date   HGBA1C 5.7 (H) 04/18/2023   HGBA1C 5.7 10/29/2013   Lab Results  Component Value Date   INSULIN 8.8 04/18/2023   INSULIN 12.2 04/28/2021   Lab Results  Component Value Date   TSH 2.440 09/13/2022   CBC    Component Value Date/Time   WBC 4.6 02/24/2021 0745   RBC 4.89 02/24/2021 0745   HGB 15.0 02/24/2021 0745   HGB 12.7 01/31/2019 1015   HCT 44.0 02/24/2021 0745   HCT 38.0 01/31/2019 1015   PLT 241.0 02/24/2021 0745   PLT 603 (H) 01/31/2019  1015   MCV 89.8 02/24/2021 0745   MCV 91 01/31/2019 1015   MCH 30.4 09/07/2020 0850   MCHC 34.1 02/24/2021 0745   RDW 14.8 02/24/2021 0745   RDW 12.5 01/31/2019 1015   Iron Studies    Component Value Date/Time   FERRITIN 58 09/05/2020 0241   FERRITIN 265 (H) 01/31/2019 1015   Lipid Panel     Component Value Date/Time   CHOL 249 (H) 04/18/2023 0747   TRIG 104 04/18/2023 0747   HDL 58 04/18/2023 0747   CHOLHDL 4 02/24/2021 0745   VLDL 27.6 02/24/2021 0745   LDLCALC 173 (H) 04/18/2023 0747   LDLCALC 152 (H) 12/06/2019 0930   Hepatic Function Panel     Component Value Date/Time   PROT 7.1 04/18/2023 0747   ALBUMIN 4.4  04/18/2023 0747   AST 18 04/18/2023 0747   ALT 21 04/18/2023 0747   ALKPHOS 116 04/18/2023 0747   BILITOT 0.4 04/18/2023 0747      Component Value Date/Time   TSH 2.440 09/13/2022 0930   Nutritional Lab Results  Component Value Date   VD25OH 50.7 04/18/2023   VD25OH 32.9 09/13/2022   VD25OH 30.6 02/28/2022     Assessment and Plan Assessment & Plan Obesity She is actively managing her weight through a category three eating plan, adhered to 85% of the time, and regular physical activity, including walking and strengthening exercises for about 30 minutes most days. She has lost four pounds in the last month and is mindful of her eating habits, identifying and managing triggers for emotional eating. She incorporates protein drinks in the afternoon to manage hunger and prevent overeating. - Continue current eating plan and exercise regimen - Follow up in four weeks  Emotional eating behaviors Emotional eating behaviors are managed with Wellbutrin. She reports feeling well-managed and prefers to maintain her current medication regimen to avoid disrupting progress. - Refill Wellbutrin prescription  Prediabetes Prediabetes is managed with metformin, diet, and exercise. She reports consistent medication adherence, effectively managing blood sugar levels. - Refill metformin prescription  Vitamin D deficiency Vitamin D deficiency is managed with prescription vitamin D. She requests a refill. - Refill prescription vitamin D  HLD She is working on diet and exercise to treat this -Check labs and follow up in 4 weeks    She was informed of the importance of frequent follow up visits to maximize her success with intensive lifestyle modifications for her multiple health conditions.    Quillian Quince, MD

## 2023-11-14 LAB — CMP14+EGFR
ALT: 23 IU/L (ref 0–32)
AST: 24 IU/L (ref 0–40)
Albumin: 4.3 g/dL (ref 3.8–4.9)
Alkaline Phosphatase: 99 IU/L (ref 44–121)
BUN/Creatinine Ratio: 29 — ABNORMAL HIGH (ref 9–23)
BUN: 26 mg/dL — ABNORMAL HIGH (ref 6–24)
Bilirubin Total: 0.3 mg/dL (ref 0.0–1.2)
CO2: 22 mmol/L (ref 20–29)
Calcium: 9.3 mg/dL (ref 8.7–10.2)
Chloride: 103 mmol/L (ref 96–106)
Creatinine, Ser: 0.9 mg/dL (ref 0.57–1.00)
Globulin, Total: 2.2 g/dL (ref 1.5–4.5)
Glucose: 96 mg/dL (ref 70–99)
Potassium: 5 mmol/L (ref 3.5–5.2)
Sodium: 139 mmol/L (ref 134–144)
Total Protein: 6.5 g/dL (ref 6.0–8.5)
eGFR: 77 mL/min/{1.73_m2} (ref >59)

## 2023-11-14 LAB — TSH: TSH: 2.48 u[IU]/mL (ref 0.450–4.500)

## 2023-11-14 LAB — LIPID PANEL WITH LDL/HDL RATIO
Cholesterol, Total: 226 mg/dL — ABNORMAL HIGH (ref 100–199)
HDL: 50 mg/dL (ref >39)
LDL Chol Calc (NIH): 165 mg/dL — ABNORMAL HIGH (ref 0–99)
LDL/HDL Ratio: 3.3 ratio — ABNORMAL HIGH (ref 0.0–3.2)
Triglycerides: 65 mg/dL (ref 0–149)
VLDL Cholesterol Cal: 11 mg/dL (ref 5–40)

## 2023-11-14 LAB — HEMOGLOBIN A1C
Est. average glucose Bld gHb Est-mCnc: 120 mg/dL
Hgb A1c MFr Bld: 5.8 % — ABNORMAL HIGH (ref 4.8–5.6)

## 2023-11-14 LAB — VITAMIN D 25 HYDROXY (VIT D DEFICIENCY, FRACTURES): Vit D, 25-Hydroxy: 34.7 ng/mL (ref 30.0–100.0)

## 2023-11-14 LAB — INSULIN, RANDOM: INSULIN: 6.5 u[IU]/mL (ref 2.6–24.9)

## 2023-11-14 LAB — VITAMIN B12: Vitamin B-12: 912 pg/mL (ref 232–1245)

## 2023-11-20 ENCOUNTER — Encounter: Payer: Self-pay | Admitting: Nurse Practitioner

## 2023-11-21 ENCOUNTER — Encounter: Payer: Self-pay | Admitting: Nurse Practitioner

## 2023-11-21 ENCOUNTER — Ambulatory Visit (INDEPENDENT_AMBULATORY_CARE_PROVIDER_SITE_OTHER): Payer: 59 | Admitting: Nurse Practitioner

## 2023-11-21 VITALS — BP 114/78 | HR 70 | Ht 65.5 in | Wt 221.0 lb

## 2023-11-21 DIAGNOSIS — Z1331 Encounter for screening for depression: Secondary | ICD-10-CM

## 2023-11-21 DIAGNOSIS — Z01419 Encounter for gynecological examination (general) (routine) without abnormal findings: Secondary | ICD-10-CM

## 2023-11-21 DIAGNOSIS — Z780 Asymptomatic menopausal state: Secondary | ICD-10-CM

## 2023-11-21 NOTE — Progress Notes (Signed)
 Sherry Marquez 1970-11-29 [id number]   History:  53 y.o. [id number] presents for annual exam. Postmenopausal - no HRT. Occasional hot flashes. Brain fog. Normal pap and mammogram history. 7 cm right ovarian cystectomy 05/2020, repeat ultrasound showed 2 cm cyst present. 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 Depression managed by behavioral health. Prediabetes managed by PCP.   Gynecologic History Patient's last menstrual period was 08/22/2021.   Contraception/Family planning: vasectomy Sexually active: Yes  Health Maintenance Last Pap: 10/25/2021. Results were: Normal neg HPV Last mammogram: 11/15/2023. Results were: Normal Last colonoscopy: Never. Negative Cologuard 09/22/2021 Last Dexa: Never     11/21/2023   10:02 AM  Depression screen PHQ 2/9  Decreased Interest 0  Down, Depressed, Hopeless 0  PHQ - 2 Score 0     Past medical history, past surgical history, family history and social history were all reviewed and documented in the EPIC chart. Married. SAHM. 2 sons ages 68 (8th grade) and 17(senior, plans for [employer] in the fall for Actuary path, on spectrum). Significant family history of heart disease.   ROS:  A ROS was performed and pertinent positives and negatives are included.  Exam:  Vitals:   11/21/23 1002  BP: 114/78  Pulse: 70  SpO2: 95%  Weight: 221 lb (100.2 kg)  Height: 5' 5.5" (1.664 m)      Body mass index is 36.22 kg/m.  General appearance:  Normal Thyroid:  Symmetrical, normal in size, without palpable masses or nodularity. Respiratory  Auscultation:  Clear without wheezing or rhonchi Cardiovascular  Auscultation:  Regular rate, without rubs, murmurs or gallops  Edema/varicosities:  Not grossly evident Abdominal  Soft,nontender, without masses, guarding or rebound.  Liver/spleen:  No organomegaly noted  Hernia:  None appreciated  Skin  Inspection:  Grossly normal   Breasts: Examined lying and sitting.   Right: Without masses,  retractions, discharge or axillary adenopathy.   Left: Without masses, retractions, discharge or axillary adenopathy. Pelvic: External genitalia:  no lesions              Urethra:  normal appearing urethra with no masses, tenderness or lesions              Bartholins and Skenes: normal                 Vagina: normal appearing vagina with normal color and discharge, no lesions              Cervix: no lesions Bimanual Exam:  Uterus:  no masses or tenderness              Adnexa: no mass, fullness, tenderness              Rectovaginal: Deferred              Anus:  normal, no lesions  Patient informed chaperone available to be present for breast and pelvic exam. Patient has requested no chaperone to be present. Patient has been advised what will be completed during breast and pelvic exam.   Assessment/Plan:  53 y.o. [id number] for annual exam.   Well female exam with routine gynecological exam - Education provided on SBEs, importance of preventative screenings, current guidelines, high calcium diet, regular exercise, and multivitamin daily. Labs with PCP.   Postmenopausal - occasional hot flashes. Recommend Mag L-threonate for brain fog. Contraindication for ERT due to H/O PE/DVT.   Screening for cervical cancer - Normal Pap history. Will repeat at 5-year interval per guidelines.  Screening for breast cancer - Normal mammogram history.  Continue annual screenings.  Normal breast exam today.  Screening for colon cancer - Negative Cologuard 09/2021.   Return in about 1 year (around 11/20/2024) for Annual.    Andee Bamberger DNP, 10:14 AM 11/21/2023

## 2023-12-07 ENCOUNTER — Ambulatory Visit: Payer: 59 | Admitting: Psychology

## 2023-12-11 ENCOUNTER — Ambulatory Visit (INDEPENDENT_AMBULATORY_CARE_PROVIDER_SITE_OTHER): Admitting: Family Medicine

## 2023-12-11 ENCOUNTER — Other Ambulatory Visit (INDEPENDENT_AMBULATORY_CARE_PROVIDER_SITE_OTHER): Payer: Self-pay | Admitting: Family Medicine

## 2023-12-11 ENCOUNTER — Encounter (INDEPENDENT_AMBULATORY_CARE_PROVIDER_SITE_OTHER): Payer: Self-pay | Admitting: Family Medicine

## 2023-12-11 VITALS — BP 166/84 | HR 60 | Temp 98.7°F | Ht 66.0 in | Wt 215.0 lb

## 2023-12-11 DIAGNOSIS — R7303 Prediabetes: Secondary | ICD-10-CM

## 2023-12-11 DIAGNOSIS — R030 Elevated blood-pressure reading, without diagnosis of hypertension: Secondary | ICD-10-CM | POA: Diagnosis not present

## 2023-12-11 DIAGNOSIS — Z6834 Body mass index (BMI) 34.0-34.9, adult: Secondary | ICD-10-CM

## 2023-12-11 DIAGNOSIS — F3289 Other specified depressive episodes: Secondary | ICD-10-CM

## 2023-12-11 DIAGNOSIS — E559 Vitamin D deficiency, unspecified: Secondary | ICD-10-CM

## 2023-12-11 DIAGNOSIS — F5089 Other specified eating disorder: Secondary | ICD-10-CM

## 2023-12-11 DIAGNOSIS — E669 Obesity, unspecified: Secondary | ICD-10-CM

## 2023-12-11 MED ORDER — VITAMIN D (ERGOCALCIFEROL) 1.25 MG (50000 UNIT) PO CAPS
50000.0000 [IU] | ORAL_CAPSULE | ORAL | 0 refills | Status: DC
Start: 2023-12-11 — End: 2024-01-10

## 2023-12-11 MED ORDER — BUPROPION HCL ER (SR) 200 MG PO TB12: 200.0000 mg | ORAL_TABLET | Freq: Two times a day (BID) | ORAL | 0 refills | Status: DC

## 2023-12-11 MED ORDER — METFORMIN HCL 500 MG PO TABS
500.0000 mg | ORAL_TABLET | Freq: Every day | ORAL | 0 refills | Status: DC
Start: 2023-12-11 — End: 2024-01-10

## 2023-12-11 NOTE — Progress Notes (Signed)
 Office: [phone]  /  Fax: [phone]  WEIGHT SUMMARY AND BIOMETRICS  Anthropometric Measurements Height: 5\' 6"  (1.676 m) Weight: 215 lb (97.5 kg) BMI (Calculated): 34.72 Weight at Last Visit: 215 lb Weight Lost Since Last Visit: 0 Weight Gained Since Last Visit: 0 Starting Weight: 232 lb Total Weight Loss (lbs): 17 lb (7.711 kg)   Body Composition  Body Fat %: 39.1 % Fat Mass (lbs): 84.2 lbs Muscle Mass (lbs): 124.4 lbs Total Body Water (lbs): 84.4 lbs Visceral Fat Rating : 10   Other Clinical Data Fasting: No Labs: No Today's Visit #: 41 Starting Date: 04/28/21    Chief Complaint: OBESITY   History of Present Illness Sherry Marquez is a 53 year old female who presents for obesity treatment and progress assessment.  She is adhering to the prescribed category three eating plan approximately 75% of the time and engages in a combination of walking and weight lifting for about 20 minutes on most days of the week. Despite these efforts, her weight has remained stable over the past three weeks. Routine changes, such as spring break, have led to increased carbohydrate intake. She has set a personal goal to lose 10 pounds by September, aiming for a structured routine to support this goal.  She has a history of vitamin D  deficiency and is currently taking prescription vitamin D  at a dose of 50,000 IU weekly. Recent lab results indicated a slight decrease in her vitamin D  levels, necessitating continued supplementation.  She has prediabetes and is on metformin  500 mg daily. Recent lab results showed an A1c of 5.8, fasting glucose of 96, and insulin  level of 6.5, indicating good control. She has also started taking chromium picolinate but is unsure of its effectiveness. She reports a poor night's sleep after taking chromium picolinate before bed.  She has a history of emotional eating behaviors and is on Wellbutrin  SR 200 mg twice a day. She has realized that indulging in  non-program foods is a form of self-reward, as she manages her family's needs extensively.  Her blood pressure today was elevated at 146/91, with a repeat measurement of 166/84. She has experienced occasional elevated blood pressures in the past but is not currently on any antihypertensive medications. She attributes the elevated readings to stress and the discomfort of the blood pressure cuff.      PHYSICAL EXAM:  Blood pressure (!) 166/84, pulse 60, temperature 98.7 F (37.1 C), height 5\' 6"  (1.676 m), weight 215 lb (97.5 kg), last menstrual period 08/22/2021, SpO2 98%. Body mass index is 34.7 kg/m.  DIAGNOSTIC DATA REVIEWED:  BMET    Component Value Date/Time   NA 139 11/13/2023 0833   K 5.0 11/13/2023 0833   CL 103 11/13/2023 0833   CO2 22 11/13/2023 0833   GLUCOSE 96 11/13/2023 0833   GLUCOSE 105 (H) 02/24/2021 0745   BUN 26 (H) 11/13/2023 0833   CREATININE 0.90 11/13/2023 0833   CREATININE 1.03 12/06/2019 0930   CALCIUM 9.3 11/13/2023 0833   GFRNONAA >60 09/02/2020 1309   Lab Results  Component Value Date   HGBA1C 5.8 (H) 11/13/2023   HGBA1C 5.7 10/29/2013   Lab Results  Component Value Date   INSULIN  6.5 11/13/2023   INSULIN  12.2 04/28/2021   Lab Results  Component Value Date   TSH 2.480 11/13/2023   CBC    Component Value Date/Time   WBC 4.6 02/24/2021 0745   RBC 4.89 02/24/2021 0745   HGB 15.0 02/24/2021 0745   HGB  12.7 01/31/2019 1015   HCT 44.0 02/24/2021 0745   HCT 38.0 01/31/2019 1015   PLT 241.0 02/24/2021 0745   PLT 603 (H) 01/31/2019 1015   MCV 89.8 02/24/2021 0745   MCV 91 01/31/2019 1015   MCH 30.4 09/07/2020 0850   MCHC 34.1 02/24/2021 0745   RDW 14.8 02/24/2021 0745   RDW 12.5 01/31/2019 1015   Iron Studies    Component Value Date/Time   FERRITIN 58 09/05/2020 0241   FERRITIN 265 (H) 01/31/2019 1015   Lipid Panel     Component Value Date/Time   CHOL 226 (H) 11/13/2023 0833   TRIG 65 11/13/2023 0833   HDL 50 11/13/2023 0833    CHOLHDL 4 02/24/2021 0745   VLDL 27.6 02/24/2021 0745   LDLCALC 165 (H) 11/13/2023 0833   LDLCALC 152 (H) 12/06/2019 0930   Hepatic Function Panel     Component Value Date/Time   PROT 6.5 11/13/2023 0833   ALBUMIN 4.3 11/13/2023 0833   AST 24 11/13/2023 0833   ALT 23 11/13/2023 0833   ALKPHOS 99 11/13/2023 0833   BILITOT 0.3 11/13/2023 0833      Component Value Date/Time   TSH 2.480 11/13/2023 0833   Nutritional Lab Results  Component Value Date   VD25OH 34.7 11/13/2023   VD25OH 50.7 04/18/2023   VD25OH 32.9 09/13/2022     Assessment and Plan Assessment & Plan Obesity Obesity management with a category three eating plan. She adheres to the plan approximately 75% of the time and engages in regular exercise, including walking and weight lifting. Weight has been maintained over the last three weeks. She aims to lose 10 pounds by September, targeting 2-2.5 pounds per month. Emphasized the importance of maintaining a routine, especially during unstructured times like summer, to support weight loss goals. - Continue category three eating plan - Continue regular exercise regimen - Support goal of losing 10 pounds by September - Encourage maintaining a routine to support weight loss  Emotional eating behaviors Emotional eating behaviors managed with Wellbutrin  SR 200 mg twice a day. She identifies emotional eating as a reward and a personal activity amidst family responsibilities. Discussed the importance of finding alternative self-care activities. - Continue Wellbutrin  SR 200 mg twice a day - Encourage finding alternative self-care activities  Prediabetes Prediabetes managed with metformin  500 mg daily. Recent labs show A1c at 5.8, fasting glucose at 96, and insulin  at 6.5, indicating good control. Discussed the potential to increase metformin  dosage if needed, but current management is effective. She has also started taking chromium picolinate as suggested by her GP, though  its effectiveness compared to metformin  is minimal. - Continue metformin  500 mg daily - Monitor blood glucose levels - Consider increasing metformin  dosage if needed - Monitor kidney function due to chromium picolinate use  Elevated blood pressure Elevated blood pressure readings today at 146/91 and 166/84. She reports occasional elevated readings in the past but no history of hypertension. Stress and anxiety may contribute to elevated readings. No current antihypertensive treatment. - Monitor blood pressure closely -recheck in office next month -Continue to work on weight loss to improve BP  Vitamin D  deficiency Vitamin D  deficiency managed with prescription vitamin D  50,000 IU weekly. Recent labs indicate a slight decrease in vitamin D  levels. Emphasized the importance of continuing supplementation. - Continue vitamin D  50,000 IU weekly   She was informed of the importance of frequent follow up visits to maximize her success with intensive lifestyle modifications for her multiple health conditions.  Jasmine Mesi, MD

## 2024-01-08 ENCOUNTER — Ambulatory Visit (INDEPENDENT_AMBULATORY_CARE_PROVIDER_SITE_OTHER): Admitting: Family Medicine

## 2024-01-09 ENCOUNTER — Other Ambulatory Visit (INDEPENDENT_AMBULATORY_CARE_PROVIDER_SITE_OTHER): Payer: Self-pay | Admitting: Family Medicine

## 2024-01-09 DIAGNOSIS — R7303 Prediabetes: Secondary | ICD-10-CM

## 2024-01-10 ENCOUNTER — Ambulatory Visit (INDEPENDENT_AMBULATORY_CARE_PROVIDER_SITE_OTHER): Admitting: Family Medicine

## 2024-01-10 ENCOUNTER — Encounter (INDEPENDENT_AMBULATORY_CARE_PROVIDER_SITE_OTHER): Payer: Self-pay | Admitting: Family Medicine

## 2024-01-10 VITALS — BP 137/82 | HR 60 | Temp 98.1°F | Ht 66.0 in | Wt 216.0 lb

## 2024-01-10 DIAGNOSIS — E66812 Obesity, class 2: Secondary | ICD-10-CM

## 2024-01-10 DIAGNOSIS — R7303 Prediabetes: Secondary | ICD-10-CM | POA: Diagnosis not present

## 2024-01-10 DIAGNOSIS — E669 Obesity, unspecified: Secondary | ICD-10-CM | POA: Diagnosis not present

## 2024-01-10 DIAGNOSIS — E559 Vitamin D deficiency, unspecified: Secondary | ICD-10-CM

## 2024-01-10 DIAGNOSIS — Z6834 Body mass index (BMI) 34.0-34.9, adult: Secondary | ICD-10-CM

## 2024-01-10 DIAGNOSIS — F3289 Other specified depressive episodes: Secondary | ICD-10-CM

## 2024-01-10 DIAGNOSIS — F5089 Other specified eating disorder: Secondary | ICD-10-CM

## 2024-01-10 MED ORDER — VITAMIN D (ERGOCALCIFEROL) 1.25 MG (50000 UNIT) PO CAPS
50000.0000 [IU] | ORAL_CAPSULE | ORAL | 0 refills | Status: DC
Start: 2024-01-10 — End: 2024-02-28

## 2024-01-10 MED ORDER — METFORMIN HCL 500 MG PO TABS
500.0000 mg | ORAL_TABLET | Freq: Every day | ORAL | 0 refills | Status: DC
Start: 2024-01-10 — End: 2024-02-28

## 2024-01-10 MED ORDER — BUPROPION HCL ER (SR) 200 MG PO TB12
200.0000 mg | ORAL_TABLET | Freq: Two times a day (BID) | ORAL | 0 refills | Status: DC
Start: 2024-01-10 — End: 2024-02-28

## 2024-01-10 NOTE — Progress Notes (Signed)
 Office: [phone]  /  Fax: [phone]  WEIGHT SUMMARY AND BIOMETRICS  Anthropometric Measurements Height: 5\' 6"  (1.676 m) Weight: 216 lb (98 kg) BMI (Calculated): 34.88 Weight at Last Visit: 215 lb Weight Lost Since Last Visit: 0 Weight Gained Since Last Visit: 1 lb Starting Weight: 232 lb Total Weight Loss (lbs): 16 lb (7.258 kg) Peak Weight: 232 lb   Body Composition  Body Fat %: 40.3 % Fat Mass (lbs): 87.2 lbs Muscle Mass (lbs): 122.4 lbs Total Body Water (lbs): 86.2 lbs Visceral Fat Rating : 11   Other Clinical Data Fasting: no Labs: no Today's Visit #: 40 Starting Date: 04/28/21    Chief Complaint: OBESITY   History of Present Illness Sherry Marquez is a 53 year old female who presents for obesity treatment and progress assessment.  She is adhering to a category three eating plan approximately 75% of the time and engages in daily walking for 30 minutes. Despite these efforts, she has gained one pound over the past month. She notes fluctuations in her weight, attributing some gain to water retention. Stressful events, such as her child's soccer tryouts, have impacted her weight management.  She is currently on metformin  500 mg for prediabetes, alongside diet, exercise, and weight loss efforts. Additionally, she takes bupropion  SR 200 mg BID to manage emotional eating behaviors and requests a refill for this medication.  She is being treated for vitamin D  deficiency with a prescription of vitamin D  50,000 IU weekly.  She walks approximately 10,000 steps daily, primarily due to walking her dog, but has not engaged in weightlifting much this month.  She experiences occasional sleep disturbances, waking around 3 AM but usually falls back asleep easily. She typically wakes once per night to use the bathroom. She takes magnesium before bed, which sometimes increases her nighttime urination if consumed with excessive water.      PHYSICAL EXAM:  Blood pressure  137/82, pulse 60, temperature 98.1 F (36.7 C), height 5\' 6"  (1.676 m), weight 216 lb (98 kg), last menstrual period 08/22/2021, SpO2 99%. Body mass index is 34.86 kg/m.  DIAGNOSTIC DATA REVIEWED:  BMET    Component Value Date/Time   NA 139 11/13/2023 0833   K 5.0 11/13/2023 0833   CL 103 11/13/2023 0833   CO2 22 11/13/2023 0833   GLUCOSE 96 11/13/2023 0833   GLUCOSE 105 (H) 02/24/2021 0745   BUN 26 (H) 11/13/2023 0833   CREATININE 0.90 11/13/2023 0833   CREATININE 1.03 12/06/2019 0930   CALCIUM 9.3 11/13/2023 0833   GFRNONAA >60 09/02/2020 1309   Lab Results  Component Value Date   HGBA1C 5.8 (H) 11/13/2023   HGBA1C 5.7 10/29/2013   Lab Results  Component Value Date   INSULIN  6.5 11/13/2023   INSULIN  12.2 04/28/2021   Lab Results  Component Value Date   TSH 2.480 11/13/2023   CBC    Component Value Date/Time   WBC 4.6 02/24/2021 0745   RBC 4.89 02/24/2021 0745   HGB 15.0 02/24/2021 0745   HGB 12.7 01/31/2019 1015   HCT 44.0 02/24/2021 0745   HCT 38.0 01/31/2019 1015   PLT 241.0 02/24/2021 0745   PLT 603 (H) 01/31/2019 1015   MCV 89.8 02/24/2021 0745   MCV 91 01/31/2019 1015   MCH 30.4 09/07/2020 0850   MCHC 34.1 02/24/2021 0745   RDW 14.8 02/24/2021 0745   RDW 12.5 01/31/2019 1015   Iron Studies    Component Value Date/Time   FERRITIN 58 09/05/2020 0241  FERRITIN 265 (H) 01/31/2019 1015   Lipid Panel     Component Value Date/Time   CHOL 226 (H) 11/13/2023 0833   TRIG 65 11/13/2023 0833   HDL 50 11/13/2023 0833   CHOLHDL 4 02/24/2021 0745   VLDL 27.6 02/24/2021 0745   LDLCALC 165 (H) 11/13/2023 0833   LDLCALC 152 (H) 12/06/2019 0930   Hepatic Function Panel     Component Value Date/Time   PROT 6.5 11/13/2023 0833   ALBUMIN 4.3 11/13/2023 0833   AST 24 11/13/2023 0833   ALT 23 11/13/2023 0833   ALKPHOS 99 11/13/2023 0833   BILITOT 0.3 11/13/2023 0833      Component Value Date/Time   TSH 2.480 11/13/2023 0833   Nutritional Lab  Results  Component Value Date   VD25OH 34.7 11/13/2023   VD25OH 50.7 04/18/2023   VD25OH 32.9 09/13/2022     Assessment and Plan Assessment & Plan Obesity and Emotional Eating Behaviors She is following a category three eating plan approximately 75% of the time and engages in regular physical activity, including walking 30 minutes daily. She aims to increase weightlifting. Despite these efforts, she has gained one pound in the last month. Emotional eating behaviors are managed with bupropion  SR 200 mg BID. She focuses on hydration and protein intake to manage her weight and plans to maintain a routine, especially during the summer months. - Refill bupropion  SR 200 mg BID for 90 days - Encourage adherence to the eating plan and regular physical activity - Focus on hydration and protein intake - Maintain a consistent routine to support weight management  Prediabetes She is treated for prediabetes with metformin  500 mg, alongside diet, exercise, and weight loss efforts, to manage blood glucose levels and prevent progression to diabetes. - Refill metformin  500 mg for 90 days - Continue diet, exercise, and weight loss efforts  Vitamin D  Deficiency She is treated for vitamin D  deficiency with prescription vitamin D  50,000 IU weekly. - Refill vitamin D  50,000 IU weekly for 90 days  Follow-up A follow-up appointment is scheduled for February 28, 2024, with medication refills covering until then. An additional follow-up is planned for the end of August 2025. - Ensure medication refills cover until the next appointment on February 28, 2024 - Schedule a follow-up appointment for the end of August 2025   She was informed of the importance of frequent follow up visits to maximize her success with intensive lifestyle modifications for her multiple health conditions.    Jasmine Mesi, MD

## 2024-01-11 ENCOUNTER — Ambulatory Visit: Admitting: Psychology

## 2024-01-11 DIAGNOSIS — F4321 Adjustment disorder with depressed mood: Secondary | ICD-10-CM | POA: Diagnosis not present

## 2024-01-11 NOTE — Progress Notes (Signed)
 [facility] Counselor/Therapist Progress Note  Patient ID: TRAMAINE SNELL, MRN: [id number],    Date: 01/11/2024  Time Spent: 10:00am-10:50am   50 minutes   Treatment Type: Individual Therapy  Reported Symptoms: stress  Mental Status Exam: Appearance:  Casual     Behavior: Appropriate  Motor: Normal  Speech/Language:  Normal Rate  Affect: Appropriate  Mood: normal  Thought process: normal  Thought content:   WNL  Sensory/Perceptual disturbances:   WNL  Orientation: oriented to person, place, time/date, and situation  Attention: Good  Concentration: Good  Memory: Immediate;   Good  Fund of knowledge:  Good  Insight:   Good  Judgment:  Good  Impulse Control: Good   Risk Assessment: Danger to Self:  No Self-injurious Behavior: No Danger to Others: No Duty to Warn:no Physical Aggression / Violence:No  Access to Firearms a concern: No  Gang Involvement:No   Subjective: Pt present for face-to-face individual therapy via video.  Pt consents to telehealth video session and is aware of limitations and benefits of virtual sessions.   Location of pt: home Location of therapist: home office.   Pt talked about having a difficult couple of weeks.   She was stressed about her son Event organiser soccer.   Pt was upset that [employer] did not get enough playing time.  Pt was also frustrated with the way the soccer team is organized.  Addressed how pt dealt with the stress and frustration.  Pt tried to adjust her self talk to cope with the stress.   Pt tends to be hard on herself and expects things to be perfect.  Addressed how unrealistic her expectations are.   Worked with pt on how she can "inhale grace and exhale gratitude".   Pt states she is having trouble releasing and letting go as her kids grow up.   She misses being in charge.   Pt talked about worrying about what people think.  She makes up stories about what she thinks they are thinking.  Addressed how those stories are not  reality.  Worked on thought reframing.   Pt states her eating has been up and down so she has not lost much weight.   Pt is trying to focus on healthier eating habits and set small weight loss goals.   Pt has been doing more crafts that she enjoys.   Worked on self care strategies.   Provided supportive therapy.    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   Pt participated in setting treatment goals.   Plan to meet monthly.   Pt agrees with treatment plan.  Treatment Plan(treatment plan target date 07/20/2024) Client Abilities/Strengths  Pt is bright, engaging, and motivated for therapy.   Client Treatment Preferences  Individual therapy.  Client Statement of Needs  Improve coping skills.  Symptoms  Depressed or irritable mood. Low self-esteem. Unresolved grief issues.   Problems Addressed  Unipolar Depression Goals 1. Alleviate depressive symptoms and return to previous level of effective functioning. 2. Appropriately grieve the loss in order to normalize mood and to return to previously adaptive level of functioning. Objective Learn and implement behavioral strategies to overcome depression. Target Date: 2024-07-20 Frequency: monthly  Progress: 50 Modality: individual  Related Interventions Engage the client in "behavioral activation," increasing his/her activity level and contact with sources of reward, while identifying processes that inhibit activation.  Use behavioral techniques such as instruction, rehearsal, role-playing, role reversal,  as needed, to facilitate activity in the client's daily life; reinforce success. Assist the client in developing skills that increase the likelihood of deriving pleasure from behavioral activation (e.g., assertiveness skills, developing an exercise plan, less internal/more external focus, increased social involvement); reinforce  success. Objective Identify important people in life, past and present, and describe the quality, good and poor, of those relationships. Target Date: 2024-07-20 Frequency: monthly  Progress: 50 Modality: individual  Related Interventions Conduct Interpersonal Therapy beginning with the assessment of the client's "interpersonal inventory" of important past and present relationships; develop a case formulation linking depression to grief, interpersonal role disputes, role transitions, and/or interpersonal deficits). Objective Learn and implement problem-solving and decision-making skills. Target Date: 2024-07-20 Frequency: monthly  Progress: 50 Modality: individual  Related Interventions Conduct Problem-Solving Therapy using techniques such as psychoeducation, modeling, and role-playing to teach client problem-solving skills (i.e., defining a problem specifically, generating possible solutions, evaluating the pros and cons of each solution, selecting and implementing a plan of action, evaluating the efficacy of the plan, accepting or revising the plan); role-play application of the problem-solving skill to a real life issue. Encourage in the client the development of a positive problem orientation in which problems and solving them are viewed as a natural part of life and not something to be feared, despaired, or avoided. 3. Develop healthy interpersonal relationships that lead to the alleviation and help prevent the relapse of depression. 4. Develop healthy thinking patterns and beliefs about self, others, and the world that lead to the alleviation and help prevent the relapse of depression. 5. Recognize, accept, and cope with feelings of depression. Diagnosis F43.21 Conditions For Discharge Achievement of treatment goals and objectives   Willey Harrier, LCSW

## 2024-02-08 ENCOUNTER — Ambulatory Visit: Admitting: Psychology

## 2024-02-28 ENCOUNTER — Ambulatory Visit (INDEPENDENT_AMBULATORY_CARE_PROVIDER_SITE_OTHER): Admitting: Family Medicine

## 2024-02-28 ENCOUNTER — Encounter (INDEPENDENT_AMBULATORY_CARE_PROVIDER_SITE_OTHER): Payer: Self-pay | Admitting: Family Medicine

## 2024-02-28 VITALS — BP 131/86 | HR 69 | Temp 98.1°F | Ht 66.0 in | Wt 215.0 lb

## 2024-02-28 DIAGNOSIS — E669 Obesity, unspecified: Secondary | ICD-10-CM

## 2024-02-28 DIAGNOSIS — F5089 Other specified eating disorder: Secondary | ICD-10-CM

## 2024-02-28 DIAGNOSIS — E559 Vitamin D deficiency, unspecified: Secondary | ICD-10-CM | POA: Diagnosis not present

## 2024-02-28 DIAGNOSIS — F3289 Other specified depressive episodes: Secondary | ICD-10-CM

## 2024-02-28 DIAGNOSIS — R7303 Prediabetes: Secondary | ICD-10-CM

## 2024-02-28 DIAGNOSIS — Z6834 Body mass index (BMI) 34.0-34.9, adult: Secondary | ICD-10-CM

## 2024-02-28 MED ORDER — METFORMIN HCL 500 MG PO TABS: 500.0000 mg | ORAL_TABLET | Freq: Two times a day (BID) | ORAL | 0 refills | Status: DC

## 2024-02-28 MED ORDER — BUPROPION HCL ER (SR) 200 MG PO TB12: 200.0000 mg | ORAL_TABLET | Freq: Two times a day (BID) | ORAL | 0 refills | Status: DC

## 2024-02-28 MED ORDER — VITAMIN D (ERGOCALCIFEROL) 1.25 MG (50000 UNIT) PO CAPS: 50000.0000 [IU] | ORAL_CAPSULE | ORAL | 0 refills | Status: DC

## 2024-02-28 NOTE — Progress Notes (Signed)
 Office: [phone]  /  Fax: [phone]  WEIGHT SUMMARY AND BIOMETRICS  Anthropometric Measurements Height: 5' 6 (1.676 m) Weight: 215 lb (97.5 kg) BMI (Calculated): 34.72 Weight at Last Visit: 216 lb Weight Lost Since Last Visit: 1 lb Weight Gained Since Last Visit: 0 Starting Weight: 232 lb Total Weight Loss (lbs): 17 lb (7.711 kg) Peak Weight: 232 lb   Body Composition  Body Fat %: 40.3 % Fat Mass (lbs): 87 lbs Muscle Mass (lbs): 122.4 lbs Total Body Water (lbs): 86.8 lbs Visceral Fat Rating : 11   Other Clinical Data Fasting: no Labs: no Today's Visit #: 41 Starting Date: 04/28/21    Chief Complaint: OBESITY    History of Present Illness Sherry Marquez is a 53 year old female with obesity and prediabetes who presents for obesity treatment plan assessment and progress evaluation.  She is adhering to a category three eating plan approximately 70% of the time and engages in walking for 20 to 30 minutes, four times per week. She has lost one pound over the last six weeks. She reports difficulty maintaining her exercise routine due to weather conditions and social obligations, such as family visits.  She is being treated for prediabetes with metformin  500 mcg once daily. She experiences challenges with timing her medication, noting that taking it at noon results in it wearing off by dinner time, which is when she tends to snack. Her most recent hemoglobin A1c was 5.8, measured three months ago.  She has a history of vitamin D  deficiency and is on prescription vitamin D  50,000 IU weekly. Her last vitamin D  level was 34.7, checked three months ago, and she is due for another lab check soon.  She has a history of emotional eating behaviors and is managing this with Wellbutrin  SR 200 mg twice daily. She feels disappointed in her progress and struggles with maintaining consistent healthy habits, particularly in the face of stressors such as weather and family  visits.      PHYSICAL EXAM:  Blood pressure 131/86, pulse 69, temperature 98.1 F (36.7 C), height 5' 6 (1.676 m), weight 215 lb (97.5 kg), last menstrual period 08/22/2021, SpO2 97%. Body mass index is 34.7 kg/m.  DIAGNOSTIC DATA REVIEWED:  BMET    Component Value Date/Time   NA 139 11/13/2023 0833   K 5.0 11/13/2023 0833   CL 103 11/13/2023 0833   CO2 22 11/13/2023 0833   GLUCOSE 96 11/13/2023 0833   GLUCOSE 105 (H) 02/24/2021 0745   BUN 26 (H) 11/13/2023 0833   CREATININE 0.90 11/13/2023 0833   CREATININE 1.03 12/06/2019 0930   CALCIUM 9.3 11/13/2023 0833   GFRNONAA >60 09/02/2020 1309   Lab Results  Component Value Date   HGBA1C 5.8 (H) 11/13/2023   HGBA1C 5.7 10/29/2013   Lab Results  Component Value Date   INSULIN  6.5 11/13/2023   INSULIN  12.2 04/28/2021   Lab Results  Component Value Date   TSH 2.480 11/13/2023   CBC    Component Value Date/Time   WBC 4.6 02/24/2021 0745   RBC 4.89 02/24/2021 0745   HGB 15.0 02/24/2021 0745   HGB 12.7 01/31/2019 1015   HCT 44.0 02/24/2021 0745   HCT 38.0 01/31/2019 1015   PLT 241.0 02/24/2021 0745   PLT 603 (H) 01/31/2019 1015   MCV 89.8 02/24/2021 0745   MCV 91 01/31/2019 1015   MCH 30.4 09/07/2020 0850   MCHC 34.1 02/24/2021 0745   RDW 14.8 02/24/2021 0745   RDW  12.5 01/31/2019 1015   Iron Studies    Component Value Date/Time   FERRITIN 58 09/05/2020 0241   FERRITIN 265 (H) 01/31/2019 1015   Lipid Panel     Component Value Date/Time   CHOL 226 (H) 11/13/2023 0833   TRIG 65 11/13/2023 0833   HDL 50 11/13/2023 0833   CHOLHDL 4 02/24/2021 0745   VLDL 27.6 02/24/2021 0745   LDLCALC 165 (H) 11/13/2023 0833   LDLCALC 152 (H) 12/06/2019 0930   Hepatic Function Panel     Component Value Date/Time   PROT 6.5 11/13/2023 0833   ALBUMIN 4.3 11/13/2023 0833   AST 24 11/13/2023 0833   ALT 23 11/13/2023 0833   ALKPHOS 99 11/13/2023 0833   BILITOT 0.3 11/13/2023 0833      Component Value Date/Time    TSH 2.480 11/13/2023 0833   Nutritional Lab Results  Component Value Date   VD25OH 34.7 11/13/2023   VD25OH 50.7 04/18/2023   VD25OH 32.9 09/13/2022     Assessment and Plan Assessment & Plan Obesity Adhering to a category three eating plan 70% of the time and engaging in 20-30 minutes of walking four times per week. She lost one pound in the last six weeks but faces challenges with adherence due to weather and personal circumstances. Encouraged to view these as learning opportunities and strategies to improve adherence were suggested. - Continue category three eating plan - Encourage regular physical activity - Discuss strategies to improve adherence to diet and exercise  Prediabetes Currently on metformin  500 mg once daily. Most recent hemoglobin A1c was 5.8% three months ago. Discussed potential benefits of increasing metformin  to improve insulin  resistance and prevent progression to diabetes. Metformin  has anti-inflammatory properties and may increase life expectancy by approximately two years. She expressed reluctance to increase medication but is open to considering it. - Increase metformin  to 500 mg twice daily for 90 days - Discuss potential benefits and risks of increased metformin  dosage  Emotional Eating Behaviors Emotional eating behaviors managed with Wellbutrin  SR 200 mg twice daily. Requests a refill of her medication. - Refill Wellbutrin  SR 200 mg twice daily for 90 days  Vitamin D  Deficiency Vitamin D  deficiency managed with prescription vitamin D  50,000 IU weekly. Most recent vitamin D  level was 34.7 ng/mL three months ago. Due for lab re-evaluation soon. - Refill vitamin D  50,000 IU weekly for 90 days - Schedule lab re-evaluation for vitamin D  levels  Follow-up Scheduled to return in four to five weeks for follow-up. Has an appointment in August and plans to return in December. - Schedule follow-up appointment in four to five weeks - Confirm August and December  appointments    She was informed of the importance of frequent follow up visits to maximize her success with intensive lifestyle modifications for her multiple health conditions.    Louann Penton, MD

## 2024-03-14 ENCOUNTER — Ambulatory Visit: Admitting: Psychology

## 2024-03-14 DIAGNOSIS — F4321 Adjustment disorder with depressed mood: Secondary | ICD-10-CM | POA: Diagnosis not present

## 2024-03-14 NOTE — Progress Notes (Signed)
 [facility] Counselor/Therapist Progress Note  Patient ID: Sherry Marquez, MRN: [id number],    Date: 03/14/2024  Time Spent: 10:00am-10:55am   55 minutes   Treatment Type: Individual Therapy  Reported Symptoms: stress  Mental Status Exam: Appearance:  Casual     Behavior: Appropriate  Motor: Normal  Speech/Language:  Normal Rate  Affect: Appropriate  Mood: normal  Thought process: normal  Thought content:   WNL  Sensory/Perceptual disturbances:   WNL  Orientation: oriented to person, place, time/date, and situation  Attention: Good  Concentration: Good  Memory: Immediate;   Good  Fund of knowledge:  Good  Insight:   Good  Judgment:  Good  Impulse Control: Good   Risk Assessment: Danger to Self:  No Self-injurious Behavior: No Danger to Others: No Duty to Warn:no Physical Aggression / Violence:No  Access to Firearms a concern: No  Gang Involvement:No   Subjective: Pt present for face-to-face individual therapy via video.  Pt consents to telehealth video session and is aware of limitations and benefits of virtual sessions.   Location of pt: home Location of therapist: home office.   Pt talked about having a breakthrough regarding her weight loss efforts.   She has been listing to a seminar about emotional eating and the information clicked for her.  The seminar uses tapping techniques that pt is finding helpful.  Pt has stopped snacking and had made better food choices.  Pt has lost some weight and feels less inflamation.   Pt talked about stress dealing with her dog who has some behavioral issues.  Encouraged pt to contact a dog trainer to help her with the dog issues.   Pt states she is having trouble releasing and letting go as her kids grow up.   She misses being in charge.   Pt talked about her husband [name] who has been stressed and worrying.   Addressed how this impacts pt.  She is trying to encourage [name] to get back into therapy.    Worked on self  care strategies.   Provided supportive therapy.    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   Pt participated in setting treatment goals.   Plan to meet monthly.   Pt agrees with treatment plan.  Treatment Plan(treatment plan target date 07/20/2024) Client Abilities/Strengths  Pt is bright, engaging, and motivated for therapy.   Client Treatment Preferences  Individual therapy.  Client Statement of Needs  Improve coping skills.  Symptoms  Depressed or irritable mood. Low self-esteem. Unresolved grief issues.   Problems Addressed  Unipolar Depression Goals 1. Alleviate depressive symptoms and return to previous level of effective functioning. 2. Appropriately grieve the loss in order to normalize mood and to return to previously adaptive level of functioning. Objective Learn and implement behavioral strategies to overcome depression. Target Date: 2024-07-20 Frequency: monthly  Progress: 50 Modality: individual  Related Interventions Engage the client in behavioral activation, increasing his/her activity level and contact with sources of reward, while identifying processes that inhibit activation.  Use behavioral techniques such as instruction, rehearsal, role-playing, role reversal, as needed, to facilitate activity in the client's daily life; reinforce success. Assist the client in developing skills that increase the likelihood of deriving pleasure from behavioral activation (e.g., assertiveness skills, developing an exercise plan, less internal/more external focus, increased social involvement); reinforce success. Objective Identify important people in life, past and present, and describe the quality, good and poor,  of those relationships. Target Date: 2024-07-20 Frequency: monthly  Progress: 50 Modality: individual  Related Interventions Conduct Interpersonal Therapy beginning with the  assessment of the client's interpersonal inventory of important past and present relationships; develop a case formulation linking depression to grief, interpersonal role disputes, role transitions, and/or interpersonal deficits). Objective Learn and implement problem-solving and decision-making skills. Target Date: 2024-07-20 Frequency: monthly  Progress: 50 Modality: individual  Related Interventions Conduct Problem-Solving Therapy using techniques such as psychoeducation, modeling, and role-playing to teach client problem-solving skills (i.e., defining a problem specifically, generating possible solutions, evaluating the pros and cons of each solution, selecting and implementing a plan of action, evaluating the efficacy of the plan, accepting or revising the plan); role-play application of the problem-solving skill to a real life issue. Encourage in the client the development of a positive problem orientation in which problems and solving them are viewed as a natural part of life and not something to be feared, despaired, or avoided. 3. Develop healthy interpersonal relationships that lead to the alleviation and help prevent the relapse of depression. 4. Develop healthy thinking patterns and beliefs about self, others, and the world that lead to the alleviation and help prevent the relapse of depression. 5. Recognize, accept, and cope with feelings of depression. Diagnosis F43.21 Conditions For Discharge Achievement of treatment goals and objectives   Sherry Alma, LCSW

## 2024-03-27 ENCOUNTER — Ambulatory Visit (INDEPENDENT_AMBULATORY_CARE_PROVIDER_SITE_OTHER): Admitting: Family Medicine

## 2024-03-27 ENCOUNTER — Encounter (INDEPENDENT_AMBULATORY_CARE_PROVIDER_SITE_OTHER): Payer: Self-pay | Admitting: Family Medicine

## 2024-03-27 VITALS — BP 135/82 | HR 61 | Temp 98.0°F | Ht 66.0 in | Wt 211.0 lb

## 2024-03-27 DIAGNOSIS — F5089 Other specified eating disorder: Secondary | ICD-10-CM

## 2024-03-27 DIAGNOSIS — F3289 Other specified depressive episodes: Secondary | ICD-10-CM

## 2024-03-27 DIAGNOSIS — E669 Obesity, unspecified: Secondary | ICD-10-CM | POA: Diagnosis not present

## 2024-03-27 DIAGNOSIS — R7303 Prediabetes: Secondary | ICD-10-CM

## 2024-03-27 DIAGNOSIS — E66812 Obesity, class 2: Secondary | ICD-10-CM

## 2024-03-27 DIAGNOSIS — Z6834 Body mass index (BMI) 34.0-34.9, adult: Secondary | ICD-10-CM

## 2024-03-27 DIAGNOSIS — E559 Vitamin D deficiency, unspecified: Secondary | ICD-10-CM

## 2024-03-27 MED ORDER — VITAMIN D (ERGOCALCIFEROL) 1.25 MG (50000 UNIT) PO CAPS: 50000.0000 [IU] | ORAL_CAPSULE | ORAL | 0 refills | Status: DC

## 2024-03-27 NOTE — Progress Notes (Signed)
 Office: [phone]  /  Fax: [phone]  WEIGHT SUMMARY AND BIOMETRICS  Anthropometric Measurements Height: 5' 6 (1.676 m) Weight: 211 lb (95.7 kg) BMI (Calculated): 34.07 Weight at Last Visit: 215 lb Weight Lost Since Last Visit: 4 lb Weight Gained Since Last Visit: 0 Starting Weight: 232 lb Total Weight Loss (lbs): 21 lb (9.526 kg) Peak Weight: 232 lb   Body Composition  Body Fat %: 39.5 % Fat Mass (lbs): 83.6 lbs Muscle Mass (lbs): 121.8 lbs Total Body Water (lbs): 83.8 lbs Visceral Fat Rating : 10   Other Clinical Data Fasting: yes Labs: no Today's Visit #: 42 Starting Date: 04/28/21    Chief Complaint: OBESITY   History of Present Illness Sherry Marquez is a 53 year old female with obesity and prediabetes who presents for a follow-up on her weight management plan.  She is adhering to a category three eating plan with 90% compliance and engages in daily exercise, primarily through dog walking for 30 minutes. She is focusing on increasing her intake of fruits and vegetables, meeting protein goals, and maintaining hydration. She is also working on not skipping meals and achieving 7-9 hours of sleep per night. Over the past month, she has lost four pounds.  She is taking metformin  twice daily and has attended a webinar on emotional eating, which she found insightful. She learned to differentiate cravings from emotional eating and is practicing techniques to manage these behaviors. She is addressing secret eating habits by choosing healthier alternatives like raspberries instead of candy.  She is on bupropion  for emotional eating and reports stability in her behavior. She is also taking vitamin D  supplements and is due for a refill, which she plans to follow up on with her pharmacy.  She feels positive about the changes in her body composition and has noticed changes in her abdomen. She is mindful of her water retention and hydration levels, which she monitors  closely.      PHYSICAL EXAM:  Blood pressure 135/82, pulse 61, temperature 98 F (36.7 C), height 5' 6 (1.676 m), weight 211 lb (95.7 kg), last menstrual period 08/22/2021, SpO2 97%. Body mass index is 34.06 kg/m.  DIAGNOSTIC DATA REVIEWED:  BMET    Component Value Date/Time   NA 139 11/13/2023 0833   K 5.0 11/13/2023 0833   CL 103 11/13/2023 0833   CO2 22 11/13/2023 0833   GLUCOSE 96 11/13/2023 0833   GLUCOSE 105 (H) 02/24/2021 0745   BUN 26 (H) 11/13/2023 0833   CREATININE 0.90 11/13/2023 0833   CREATININE 1.03 12/06/2019 0930   CALCIUM 9.3 11/13/2023 0833   GFRNONAA >60 09/02/2020 1309   Lab Results  Component Value Date   HGBA1C 5.8 (H) 11/13/2023   HGBA1C 5.7 10/29/2013   Lab Results  Component Value Date   INSULIN  6.5 11/13/2023   INSULIN  12.2 04/28/2021   Lab Results  Component Value Date   TSH 2.480 11/13/2023   CBC    Component Value Date/Time   WBC 4.6 02/24/2021 0745   RBC 4.89 02/24/2021 0745   HGB 15.0 02/24/2021 0745   HGB 12.7 01/31/2019 1015   HCT 44.0 02/24/2021 0745   HCT 38.0 01/31/2019 1015   PLT 241.0 02/24/2021 0745   PLT 603 (H) 01/31/2019 1015   MCV 89.8 02/24/2021 0745   MCV 91 01/31/2019 1015   MCH 30.4 09/07/2020 0850   MCHC 34.1 02/24/2021 0745   RDW 14.8 02/24/2021 0745   RDW 12.5 01/31/2019 1015   Iron  Studies    Component Value Date/Time   FERRITIN 58 09/05/2020 0241   FERRITIN 265 (H) 01/31/2019 1015   Lipid Panel     Component Value Date/Time   CHOL 226 (H) 11/13/2023 0833   TRIG 65 11/13/2023 0833   HDL 50 11/13/2023 0833   CHOLHDL 4 02/24/2021 0745   VLDL 27.6 02/24/2021 0745   LDLCALC 165 (H) 11/13/2023 0833   LDLCALC 152 (H) 12/06/2019 0930   Hepatic Function Panel     Component Value Date/Time   PROT 6.5 11/13/2023 0833   ALBUMIN 4.3 11/13/2023 0833   AST 24 11/13/2023 0833   ALT 23 11/13/2023 0833   ALKPHOS 99 11/13/2023 0833   BILITOT 0.3 11/13/2023 0833      Component Value Date/Time    TSH 2.480 11/13/2023 0833   Nutritional Lab Results  Component Value Date   VD25OH 34.7 11/13/2023   VD25OH 50.7 04/18/2023   VD25OH 32.9 09/13/2022     Assessment and Plan Assessment & Plan Obesity and prediabetes Following a category three eating plan with 90% adherence and exercises 30 minutes daily. Lost 4 pounds in the last month, with 3 pounds being fat loss and 1 pound water loss. Working on increasing fruit and vegetable intake, meeting protein goals, and maintaining hydration. Focusing on not skipping meals and achieving 7-9 hours of sleep per night. On metformin  500 mg twice a day and doing better at remembering her second dose. - Continue category three eating plan and daily exercise regimen. - Continue metformin  500 mg twice a day. - Encourage continued focus on dietary goals, hydration, and sleep. - Follow up in four weeks.  Emotional eating behavior On Wellbutrin  to manage emotional eating behavior and is well-managed. Participated in a webinar on emotional eating, learning strategies to differentiate between cravings and emotional eating. Practicing techniques such as substituting healthier options like raspberries for candy and addressing secret eating habits. - Continue Wellbutrin . - Encourage continued mindfulness of emotional eating behaviors and application of learned strategies.  Vitamin D  deficiency Requires a refill of her vitamin D  prescription. Has taken her last dose and needs to contact the pharmacy for a refill. - Refill vitamin D  prescription. - Instruct her to contact pharmacy to ensure availability of vitamin D .    She was informed of the importance of frequent follow up visits to maximize her success with intensive lifestyle modifications for her multiple health conditions.    Louann Penton, MD

## 2024-05-01 ENCOUNTER — Ambulatory Visit (INDEPENDENT_AMBULATORY_CARE_PROVIDER_SITE_OTHER): Admitting: Family Medicine

## 2024-05-01 ENCOUNTER — Encounter (INDEPENDENT_AMBULATORY_CARE_PROVIDER_SITE_OTHER): Payer: Self-pay | Admitting: Family Medicine

## 2024-05-01 VITALS — BP 129/76 | HR 76 | Temp 97.9°F | Ht 66.0 in | Wt 212.0 lb

## 2024-05-01 DIAGNOSIS — E66812 Obesity, class 2: Secondary | ICD-10-CM

## 2024-05-01 DIAGNOSIS — R7303 Prediabetes: Secondary | ICD-10-CM

## 2024-05-01 DIAGNOSIS — F5089 Other specified eating disorder: Secondary | ICD-10-CM

## 2024-05-01 DIAGNOSIS — E669 Obesity, unspecified: Secondary | ICD-10-CM

## 2024-05-01 DIAGNOSIS — E559 Vitamin D deficiency, unspecified: Secondary | ICD-10-CM | POA: Diagnosis not present

## 2024-05-01 DIAGNOSIS — F3289 Other specified depressive episodes: Secondary | ICD-10-CM

## 2024-05-01 DIAGNOSIS — Z6834 Body mass index (BMI) 34.0-34.9, adult: Secondary | ICD-10-CM

## 2024-05-01 MED ORDER — BUPROPION HCL ER (SR) 200 MG PO TB12: 200.0000 mg | ORAL_TABLET | Freq: Two times a day (BID) | ORAL | 0 refills | Status: DC

## 2024-05-01 MED ORDER — VITAMIN D (ERGOCALCIFEROL) 1.25 MG (50000 UNIT) PO CAPS: 50000.0000 [IU] | ORAL_CAPSULE | ORAL | 0 refills | Status: DC

## 2024-05-01 MED ORDER — METFORMIN HCL 500 MG PO TABS: 500.0000 mg | ORAL_TABLET | Freq: Two times a day (BID) | ORAL | 0 refills | Status: DC

## 2024-05-01 NOTE — Progress Notes (Signed)
 Office: [phone]  /  Fax: [phone]  WEIGHT SUMMARY AND BIOMETRICS  Anthropometric Measurements Height: 5' 6 (1.676 m) Weight: 212 lb (96.2 kg) BMI (Calculated): 34.23 Weight at Last Visit: 211 lb Weight Lost Since Last Visit: 0 Weight Gained Since Last Visit: 1 lb Starting Weight: 232 lb Total Weight Loss (lbs): 20 lb (9.072 kg) Peak Weight: 232 lb   Body Composition  Body Fat %: 38.9 % Fat Mass (lbs): 82.8 lbs Muscle Mass (lbs): 123.4 lbs Total Body Water (lbs): 84.4 lbs Visceral Fat Rating : 10   Other Clinical Data Fasting: no Labs: no Today's Visit #: 35 Starting Date: 04/28/21    Chief Complaint: OBESITY   History of Present Illness Sherry Marquez is a 53 year old female with obesity who presents for obesity treatment plan assessment and progress evaluation.  She follows her prescribed category three eating plan approximately 75% of the time and engages in physical activity by walking for 20 to 30 minutes seven days a week. She struggles with consuming all her vegetables, meeting protein goals, and achieving 7 to 9 hours of sleep per night. She has gained one pound in the last month since her last visit.  She is being treated for vitamin D  deficiency with ergocalciferol  50,000 IU per week and requests a refill. She has been off vitamin D  for over two weeks due to a lapse in automatic refills.  She is also being treated for emotional eating behavior with bupropion  SR 200 mg twice daily and requests a refill. She has not eaten any candy recently and has been opting for healthier beverage choices like spring or sparkling water.  Her prediabetes is managed with exercise, weight loss, and metformin  500 mg twice daily, for which she also requests a refill. Only metformin  was on automatic refill previously.  She describes challenges with sleep, including 'rotisserie chicken sleeping' due to restless legs, which she attributes to not picking up her magnesium  supplement. She notes improvement when her husband is out of town, allowing her more space in bed, but also mentions disturbances from a [employer].  She experiences hot flashes, particularly when it is warm, and finds relief when the temperature drops at night. She discusses the difficulty of maintaining a cool sleeping environment due to the cost of air conditioning and the structure of her home.      PHYSICAL EXAM:  Blood pressure 129/76, pulse 76, temperature 97.9 F (36.6 C), height 5' 6 (1.676 m), weight 212 lb (96.2 kg), last menstrual period 08/22/2021, SpO2 (!) 61%. Body mass index is 34.22 kg/m.  DIAGNOSTIC DATA REVIEWED:  BMET    Component Value Date/Time   NA 139 11/13/2023 0833   K 5.0 11/13/2023 0833   CL 103 11/13/2023 0833   CO2 22 11/13/2023 0833   GLUCOSE 96 11/13/2023 0833   GLUCOSE 105 (H) 02/24/2021 0745   BUN 26 (H) 11/13/2023 0833   CREATININE 0.90 11/13/2023 0833   CREATININE 1.03 12/06/2019 0930   CALCIUM 9.3 11/13/2023 0833   GFRNONAA >60 09/02/2020 1309   Lab Results  Component Value Date   HGBA1C 5.8 (H) 11/13/2023   HGBA1C 5.7 10/29/2013   Lab Results  Component Value Date   INSULIN  6.5 11/13/2023   INSULIN  12.2 04/28/2021   Lab Results  Component Value Date   TSH 2.480 11/13/2023   CBC    Component Value Date/Time   WBC 4.6 02/24/2021 0745   RBC 4.89 02/24/2021 0745   HGB 15.0  02/24/2021 0745   HGB 12.7 01/31/2019 1015   HCT 44.0 02/24/2021 0745   HCT 38.0 01/31/2019 1015   PLT 241.0 02/24/2021 0745   PLT 603 (H) 01/31/2019 1015   MCV 89.8 02/24/2021 0745   MCV 91 01/31/2019 1015   MCH 30.4 09/07/2020 0850   MCHC 34.1 02/24/2021 0745   RDW 14.8 02/24/2021 0745   RDW 12.5 01/31/2019 1015   Iron Studies    Component Value Date/Time   FERRITIN 58 09/05/2020 0241   FERRITIN 265 (H) 01/31/2019 1015   Lipid Panel     Component Value Date/Time   CHOL 226 (H) 11/13/2023 0833   TRIG 65 11/13/2023 0833   HDL 50  11/13/2023 0833   CHOLHDL 4 02/24/2021 0745   VLDL 27.6 02/24/2021 0745   LDLCALC 165 (H) 11/13/2023 0833   LDLCALC 152 (H) 12/06/2019 0930   Hepatic Function Panel     Component Value Date/Time   PROT 6.5 11/13/2023 0833   ALBUMIN 4.3 11/13/2023 0833   AST 24 11/13/2023 0833   ALT 23 11/13/2023 0833   ALKPHOS 99 11/13/2023 0833   BILITOT 0.3 11/13/2023 0833      Component Value Date/Time   TSH 2.480 11/13/2023 0833   Nutritional Lab Results  Component Value Date   VD25OH 34.7 11/13/2023   VD25OH 50.7 04/18/2023   VD25OH 32.9 09/13/2022     Assessment and Plan Assessment & Plan Obesity Obesity management is ongoing. Despite a one-pound weight gain, bioimpedance analysis indicates an increase in muscle mass rather than fat, which is a positive outcome. Challenges include adherence to the eating plan, meeting protein goals, and achieving adequate sleep, all of which impact weight loss efforts. She is engaging in regular physical activity, walking 20-30 minutes daily, and has made dietary adjustments such as avoiding candy and choosing healthier beverage options. - Continue category three eating plan with emphasis on increasing vegetable intake and meeting protein goals. - Encourage continuation of daily walking for 20-30 minutes. - Advise on strategies to improve sleep duration to 7-9 hours per night. - Provide slow cooker recipes to assist with meal preparation and protein intake.  Vitamin D  deficiency Vitamin D  deficiency is being managed with ergocalciferol  50,000 IU weekly. She has been off vitamin D  for over two weeks due to a lapse in refills, which has now been addressed. - Refill ergocalciferol  50,000 IU weekly prescription. - Ensure vitamin D  prescription is set to automatic refill.  Emotional eating behavior Emotional eating behavior is being treated with bupropion  SR 200 mg twice daily. She has been without Wellbutrin  since Sunday due to a refill issue, which has  been resolved. - Refill bupropion  SR 200 mg twice daily prescription.  Prediabetes Prediabetes is being managed with lifestyle modifications including exercise and weight loss, as well as metformin  500 mg twice daily. She is adhering to the exercise regimen and has sufficient metformin  supply. - Refill metformin  500 mg twice daily prescription. - Ensure metformin  prescription is set to automatic refill.     Jullianna was informed of the importance of frequent follow up visits to maximize her success with intensive lifestyle modifications for her obesity and obesity related health conditions as recommended by USPSTF and CMS guidelines   Louann Penton, MD

## 2024-05-09 ENCOUNTER — Ambulatory Visit: Admitting: Psychology

## 2024-05-09 DIAGNOSIS — F4321 Adjustment disorder with depressed mood: Secondary | ICD-10-CM

## 2024-05-09 NOTE — Progress Notes (Signed)
 [facility] Counselor/Therapist Progress Note  Patient ID: Sherry Marquez, MRN: [id number],    Date: 05/09/2024  Time Spent: 10:00am-10:55am   55 minutes   Treatment Type: Individual Therapy  Reported Symptoms: stress  Mental Status Exam: Appearance:  Casual     Behavior: Appropriate  Motor: Normal  Speech/Language:  Normal Rate  Affect: Appropriate  Mood: normal  Thought process: normal  Thought content:   WNL  Sensory/Perceptual disturbances:   WNL  Orientation: oriented to person, place, time/date, and situation  Attention: Good  Concentration: Good  Memory: Immediate;   Good  Fund of knowledge:  Good  Insight:   Good  Judgment:  Good  Impulse Control: Good   Risk Assessment: Danger to Self:  No Self-injurious Behavior: No Danger to Others: No Duty to Warn:no Physical Aggression / Violence:No  Access to Firearms a concern: No  Gang Involvement:No   Subjective: Sherry Marquez present for face-to-face individual therapy via video.  Sherry Marquez consents to telehealth video session and is aware of limitations and benefits of virtual sessions.   Location of Sherry Marquez: home Location of therapist: home office.   Sherry Marquez talked about her issues with meeting new people such as other soccer moms.  Sherry Marquez is feeling badly about herself and her weight and is worried people will judge her.  Sherry Marquez was tearful as she talked about how she feels.  Sherry Marquez has been in groups of new people and has not connected with anyone.  Sherry Marquez is comparing herself to others and feeling like she is not good enough.  Helped Sherry Marquez process her feelings.   Sherry Marquez talked about her weight loss efforts.   She was using tapping to help reduce emotional eating but didn't keep it up so her eating behavior hasn't been as healthy.  Sherry Marquez feels discouraged and disappointed with herself.  Addressed how Sherry Marquez can make the tapping a daily practice.  Sherry Marquez is being very hard on herself.  Worked on her issues of perfectionism.   Sherry Marquez talked about her  mother.  Her mother is having health issues and has to drastically change her diet.  Sherry Marquez's mother is upset about this so Sherry Marquez is worried about her.  Sherry Marquez is sad that her mother is aging and she is having anticipatory grief.  Helped Sherry Marquez process her feelings and worries.   Sherry Marquez misses a friend she use to walk and talk with who has moved away.   Sherry Marquez wanted to set goals to talk to one new person at the soccer games and to do the tapping for emotional eating twice a week.    Sherry Marquez also wants to attend one art class.  Sherry Marquez wants to finalize her presentation for organization class. Sherry Marquez also wants to create some quiet time for herself each day.   Worked on self care strategies.   Provided supportive therapy.    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   Sherry Marquez participated in setting treatment goals.   Plan to meet monthly.   Sherry Marquez agrees with treatment plan.  Treatment Plan(treatment plan target date 07/20/2024) Client Abilities/Strengths  Sherry Marquez is bright, engaging, and motivated for therapy.   Client Treatment Preferences  Individual therapy.  Client Statement of Needs  Improve coping skills.  Symptoms  Depressed or irritable mood. Low self-esteem. Unresolved grief issues.   Problems Addressed  Unipolar Depression Goals 1. Alleviate depressive symptoms and return to previous  level of effective functioning. 2. Appropriately grieve the loss in order to normalize mood and to return to previously adaptive level of functioning. Objective Learn and implement behavioral strategies to overcome depression. Target Date: 2024-07-20 Frequency: monthly  Progress: 50 Modality: individual  Related Interventions Engage the client in behavioral activation, increasing his/her activity level and contact with sources of reward, while identifying processes that inhibit activation.  Use behavioral techniques such as instruction, rehearsal,  role-playing, role reversal, as needed, to facilitate activity in the client's daily life; reinforce success. Assist the client in developing skills that increase the likelihood of deriving pleasure from behavioral activation (e.g., assertiveness skills, developing an exercise plan, less internal/more external focus, increased social involvement); reinforce success. Objective Identify important people in life, past and present, and describe the quality, good and poor, of those relationships. Target Date: 2024-07-20 Frequency: monthly  Progress: 50 Modality: individual  Related Interventions Conduct Interpersonal Therapy beginning with the assessment of the client's interpersonal inventory of important past and present relationships; develop a case formulation linking depression to grief, interpersonal role disputes, role transitions, and/or interpersonal deficits). Objective Learn and implement problem-solving and decision-making skills. Target Date: 2024-07-20 Frequency: monthly  Progress: 50 Modality: individual  Related Interventions Conduct Problem-Solving Therapy using techniques such as psychoeducation, modeling, and role-playing to teach client problem-solving skills (i.e., defining a problem specifically, generating possible solutions, evaluating the pros and cons of each solution, selecting and implementing a plan of action, evaluating the efficacy of the plan, accepting or revising the plan); role-play application of the problem-solving skill to a real life issue. Encourage in the client the development of a positive problem orientation in which problems and solving them are viewed as a natural part of life and not something to be feared, despaired, or avoided. 3. Develop healthy interpersonal relationships that lead to the alleviation and help prevent the relapse of depression. 4. Develop healthy thinking patterns and beliefs about self, others, and the world that lead to the alleviation  and help prevent the relapse of depression. 5. Recognize, accept, and cope with feelings of depression. Diagnosis F43.21 Conditions For Discharge Achievement of treatment goals and objectives   [name], LCSW

## 2024-05-29 ENCOUNTER — Ambulatory Visit: Payer: Self-pay

## 2024-05-29 ENCOUNTER — Ambulatory Visit: Admitting: Family Medicine

## 2024-05-29 ENCOUNTER — Encounter: Payer: Self-pay | Admitting: Family Medicine

## 2024-05-29 ENCOUNTER — Other Ambulatory Visit (INDEPENDENT_AMBULATORY_CARE_PROVIDER_SITE_OTHER): Payer: Self-pay | Admitting: Family Medicine

## 2024-05-29 VITALS — BP 140/90 | HR 70 | Temp 98.6°F | Ht 66.0 in | Wt 219.9 lb

## 2024-05-29 DIAGNOSIS — L237 Allergic contact dermatitis due to plants, except food: Secondary | ICD-10-CM

## 2024-05-29 DIAGNOSIS — L03039 Cellulitis of unspecified toe: Secondary | ICD-10-CM

## 2024-05-29 MED ORDER — CLOBETASOL PROPIONATE 0.05 % EX OINT: 1.0000 | TOPICAL_OINTMENT | Freq: Two times a day (BID) | CUTANEOUS | 0 refills | Status: AC

## 2024-05-29 MED ORDER — SULFAMETHOXAZOLE-TRIMETHOPRIM 800-160 MG PO TABS: 1.0000 | ORAL_TABLET | Freq: Two times a day (BID) | ORAL | 0 refills | Status: AC

## 2024-05-29 NOTE — Telephone Encounter (Signed)
 FYI Only or Action Required?: FYI only for provider.  Patient was last seen in primary care on 05/01/2024 by Verdon Louann BIRCH, MD.  Called Nurse Triage reporting Nail Problem.  Symptoms began several days ago.  Interventions attempted: Nothing.  Symptoms are: unchanged.  Triage Disposition: See Physician Within 24 Hours  Patient/caregiver understands and will follow disposition?: Yes   Copied from CRM [id number]. Topic: Clinical - Red Word Triage >> May 29, 2024  9:31 AM Frederich PARAS wrote: [facility] that prompted transfer to Nurse Triage: pain, possible infection  right big toe  may be infected, red and now its white, tender to touch, symptoms for about 3 days, the white is new, when the shoe hits it is causes discomfort. Reason for Disposition  [1] Wound > 48 hours old AND [2] becoming more painful or tender to touch  Answer Assessment - Initial Assessment Questions Patient states she has a red place on the base of the right great toenail that is tender, slightly swollen, white line appeared today, denies redness to the toe or anywhere else. She mentions poison ivy rash to the right wrist x 1 week that she would like looked at as well. Scheduled today in office with PCP.   1. LOCATION: Where is the wound located?      Base of the right great toenail 2. WOUND APPEARANCE: What does the wound look like?      Red, white, swelling a little 3. SIZE: If redness is present, ask: What is the size of the red area? (Inches, centimeters, or compare to size of a coin)      From left to right along the whole toe is red, 1/2 inch below where the nail starts 4. SPREAD: What's changed in the last day?  Do you see any red streaks coming from the wound?     No 5. ONSET: When did it start to look infected?      3 days ago 6. MECHANISM: How did the wound start, what was the cause?     Unsure 7. PAIN: Do you have any pain?  If Yes, ask: How bad is the pain?  (e.g., Scale 1-10; mild,  moderate, or severe)     3 tender to touch  8. FEVER: Do you have a fever? If Yes, ask: What is your temperature, how was it measured, and when did it start?     No  9. OTHER SYMPTOMS: Do you have any other symptoms? (e.g., shaking chills, weakness, rash elsewhere on body)     Poison ivy x 1 week  Answer Assessment - Initial Assessment Questions 1. APPEARANCE of RASH: What does the rash look like?      Some blistering  2. LOCATION: Where is the rash located?  (e.g., face, genitals, hands, legs)     Right wrist, left arm  3. SIZE: How large is the rash?      5x5 in patch right wrist   4. ONSET: When did the rash begin?      1 week ago  5. ITCHING: Does the rash itch? If Yes, ask: How bad is it?     No  6. EXPOSURE:  How were you exposed to the plant (poison ivy, poison oak, sumac)  When were you exposed?  Note: Sometimes a poison ivy/oak/sumac rash does not appear for 2 to 3 weeks after exposure.      Working outside with gloves, long sleeve shirt  7. PAST HISTORY: Have you had a  poison ivy rash before? If Yes, ask: How bad was it?     Yes, used OTC that dried it up  8. OTHER SYMPTOMS: Do you have any other symptoms? (e.g., fever)      No  Protocols used: Wound Infection Suspected-A-AH, Poison Ivy - Oak - [facility]

## 2024-05-29 NOTE — Telephone Encounter (Signed)
 Patient seen today at 11:20am.

## 2024-05-29 NOTE — Progress Notes (Signed)
 Established Patient Office Visit  Subjective   Patient ID: Sherry Marquez, female    DOB: 1971/05/11  Age: 53 y.o. MRN: [id number]  Chief Complaint  Patient presents with   Toe Pain    Patient complains of right great toe pain and redness x3 days, no known injury and suspected ingrown nail 1 week ago   Poison Ivy    Bilateral wrists x1 week, noticed after pulling weeds last week    Toe Pain   Poison Ivy  History of Present Illness The patient presents with a severe poison ivy rash and an infected toe.  She has a severe poison ivy rash that began a week ago. Initially worsening, the rash is now improving with over-the-counter cortisone cream and gauze wrapping. The rash is no longer oozing and is described as the worst case she has experienced, with previous episodes being only small patches. She was wearing gloves and a shirt during exposure but did not wash immediately after contact.  She has an infected toe that began three days ago, initially suspected to be an ingrown toenail. The toe became red and developed pus. She experiences calluses on this foot. Hot compresses have reduced soreness, and the pus is beginning to collect and drain. She has not experienced significant pain.    Results Procedure: Incision and drainage of toe abscess   Description: A small incision was made on the toe to create an opening. Purulent material was expressed by applying pressure from the distal aspect of the toe. No additional purulent material was detected.    Current Outpatient Medications  Medication Instructions   buPROPion  (WELLBUTRIN  SR) 200 mg, Oral, 2 times daily   cetirizine (ZYRTEC) 10 mg, Daily   clobetasol ointment (TEMOVATE) 0.05 % 1 Application, Topical, 2 times daily   COLLAGEN PO Take by mouth. 1 scoop daily   MAGNESIUM PO Daily   meloxicam  (MOBIC ) 15 mg, Oral, Daily PRN   metFORMIN  (GLUCOPHAGE ) 500 mg, Oral, 2 times daily with meals   POTASSIUM CHLORIDE PO 640 mg    sulfamethoxazole-trimethoprim (BACTRIM DS) 800-160 MG tablet 1 tablet, Oral, 2 times daily   triamcinolone  cream (KENALOG ) 0.1 % 1 Application, Topical, 2 times daily, Apply thin layer, cover with a thick hypoallergenic or non fragrant cream.   Vitamin D  (Ergocalciferol ) (DRISDOL ) 50,000 Units, Oral, Every 7 days    Patient Active Problem List   Diagnosis Date Noted   Pure hypercholesterolemia 04/18/2023   BMI 34.0-34.9,adult 09/13/2022   Beginning BMI 37.45 09/13/2022   Depression 08/09/2022   Elevated blood pressure reading without diagnosis of hypertension 08/09/2022   Abnormal craving 05/23/2022   Pre-diabetes 05/23/2022   Class 2 severe obesity with serious comorbidity and body mass index (BMI) of 37.0 to 37.9 in adult 05/23/2022   Vitamin D  deficiency 01/03/2022   Other hyperlipidemia 01/03/2022   At risk for depression 01/03/2022   Vitamin D  insufficiency 04/29/2021   Acute deep vein thrombosis (DVT) of proximal vein of left lower extremity (HCC) 10/05/2020   Pneumonia due to COVID-19 virus 09/02/2020   Dyslipidemia 09/02/2020   Pulmonary embolism (HCC) 09/02/2020   Acute respiratory failure with hypoxia (HCC) 09/02/2020   Class 2 obesity 09/02/2020   Rosacea    Right ovarian cyst    Hemorrhagic cyst of right ovary 01/30/2019   Abdominal pain 01/29/2019   Urinary tract infection symptoms 01/26/2019   Left foot pain 10/29/2013   Seasonal allergies 10/29/2013   Depressed mood 10/29/2013     Review  of Systems  All other systems reviewed and are negative.     Objective:     BP (!) 140/90   Pulse 70   Temp 98.6 F (37 C) (Oral)   Ht 5' 6 (1.676 m)   Wt 219 lb 14.4 oz (99.7 kg)   LMP 08/22/2021   SpO2 97%   BMI 35.49 kg/m    Physical Exam Physical Exam MUSCULOSKELETAL: Infected right great toe with pus, redness, and calluses, no extension of infection beyond the base. SKIN: Severe rash on hand in a cuff distribution around the right wrist, erythematous and  crusting.  No results found for any visits on 05/29/24.    The 10-year ASCVD risk score (Arnett DK, et al., 2019) is: 2.1%    Assessment & Plan:  Poison ivy -     Clobetasol Propionate; Apply 1 Application topically 2 (two) times daily.  Dispense: 30 g; Refill: 0  Infection of toenail -     Sulfamethoxazole-Trimethoprim; Take 1 tablet by mouth 2 (two) times daily for 7 days.  Dispense: 14 tablet; Refill: 0   Assessment and Plan Paronychia and cellulitis of right great toe Infected paronychia and cellulitis of the right great toe, present for about three days. The infection is localized at the bottom of the toe with pus formation. Initially suspected an ingrown toenail but the condition progressed to infection. The infection is superficial and can be managed with drainage and antibiotics. - I used a #11 blade to make a small opening in the superificial layer of skin in the right great toe cuticle, pt gave verbal consent prior to the procedure, expressed a small amount of purulent drainage, pt did not require numbing medication prior to the incision. - Instruct to apply hot compresses (20 minutes on, 20 minutes off) to draw out infection. - Prescribe Bactrim, take twice a day for seven days. - Advise to apply antibiotic ointment to prevent dressing from sticking. - Instruct to cover with a regular Band-Aid after initial dressing. - Monitor for cessation of drainage and allow scab formation before discontinuing hot compresses.  Poison ivy dermatitis Severe poison ivy dermatitis with a week-long duration. The rash is improving with over-the-counter hydrocortisone but requires stronger treatment. She has experienced milder cases in the past but this is the worst case she has had, with extensive involvement despite wearing protective clothing. - Prescribe clobetasol ointment, apply one to two times a day as needed. - Advise to cover the rash with a bandage, especially during sleep, to prevent  scratching.  No follow-ups on file.    Heron CHRISTELLA Sharper, MD

## 2024-06-03 ENCOUNTER — Encounter (INDEPENDENT_AMBULATORY_CARE_PROVIDER_SITE_OTHER): Payer: Self-pay | Admitting: Family Medicine

## 2024-06-03 ENCOUNTER — Ambulatory Visit (INDEPENDENT_AMBULATORY_CARE_PROVIDER_SITE_OTHER): Admitting: Family Medicine

## 2024-06-03 VITALS — BP 134/75 | HR 68 | Temp 98.2°F | Ht 66.0 in | Wt 213.0 lb

## 2024-06-03 DIAGNOSIS — E559 Vitamin D deficiency, unspecified: Secondary | ICD-10-CM | POA: Diagnosis not present

## 2024-06-03 DIAGNOSIS — F5089 Other specified eating disorder: Secondary | ICD-10-CM | POA: Diagnosis not present

## 2024-06-03 DIAGNOSIS — R7303 Prediabetes: Secondary | ICD-10-CM | POA: Diagnosis not present

## 2024-06-03 DIAGNOSIS — E669 Obesity, unspecified: Secondary | ICD-10-CM

## 2024-06-03 DIAGNOSIS — M722 Plantar fascial fibromatosis: Secondary | ICD-10-CM

## 2024-06-03 DIAGNOSIS — Z6834 Body mass index (BMI) 34.0-34.9, adult: Secondary | ICD-10-CM

## 2024-06-03 NOTE — Progress Notes (Signed)
 Office: [phone]  /  Fax: [phone]  WEIGHT SUMMARY AND BIOMETRICS  Anthropometric Measurements Height: 5' 6 (1.676 m) Weight: 213 lb (96.6 kg) BMI (Calculated): 34.4 Weight at Last Visit: 212 lb Weight Lost Since Last Visit: 0 Weight Gained Since Last Visit: 1 lb Starting Weight: 232 lb Total Weight Loss (lbs): 19 lb (8.618 kg) Peak Weight: 232 lb   Body Composition  Body Fat %: 39.2 % Fat Mass (lbs): 83.8 lbs Muscle Mass (lbs): 123.4 lbs Total Body Water (lbs): 81.8 lbs Visceral Fat Rating : 10   Other Clinical Data Fasting: no Labs: no Today's Visit #: 44 Starting Date: 04/28/21    Chief Complaint: OBESITY    History of Present Illness Sherry Marquez is a 53 year old female with obesity who presents for obesity treatment and progress assessment.  She is adhering to the category three obesity management plan approximately 75% of the time and engages in daily exercise by walking for 20 minutes. She has gained one pound in the last month. She finds it challenging to adhere to a protein-rich diet and has been snacking more due to distractions, including a recent bout of poison ivy and an infected toe.  She has a history of prediabetes and is currently taking metformin , which she manages well with phone reminders to take both doses daily. She also takes Wellbutrin , with the second dose around lunchtime. Her sleep has been good, often falling asleep on the couch before moving to bed, which she describes as her deepest sleep.  She is being treated for vitamin D  deficiency with ergocalciferol , 50,000 IU weekly. Her family history includes her mother and brother having alpha-gal syndrome, with her brother experiencing anaphylactic reactions, and her son being allergic to peanuts and tree nuts.  She is dealing with plantar fasciitis, which affects her walking, and is considering additional arch support as her hips have been affected, possibly due to altered gait. She  has previously seen a podiatrist for a sore spot on her foot, which has since resolved. She finds plantar stretches beneficial.      PHYSICAL EXAM:  Blood pressure 134/75, pulse 68, temperature 98.2 F (36.8 C), height 5' 6 (1.676 m), weight 213 lb (96.6 kg), last menstrual period 08/22/2021, SpO2 100%. Body mass index is 34.38 kg/m.  DIAGNOSTIC DATA REVIEWED:  BMET    Component Value Date/Time   NA 139 11/13/2023 0833   K 5.0 11/13/2023 0833   CL 103 11/13/2023 0833   CO2 22 11/13/2023 0833   GLUCOSE 96 11/13/2023 0833   GLUCOSE 105 (H) 02/24/2021 0745   BUN 26 (H) 11/13/2023 0833   CREATININE 0.90 11/13/2023 0833   CREATININE 1.03 12/06/2019 0930   CALCIUM 9.3 11/13/2023 0833   GFRNONAA >60 09/02/2020 1309   Lab Results  Component Value Date   HGBA1C 5.8 (H) 11/13/2023   HGBA1C 5.7 10/29/2013   Lab Results  Component Value Date   INSULIN  6.5 11/13/2023   INSULIN  12.2 04/28/2021   Lab Results  Component Value Date   TSH 2.480 11/13/2023   CBC    Component Value Date/Time   WBC 4.6 02/24/2021 0745   RBC 4.89 02/24/2021 0745   HGB 15.0 02/24/2021 0745   HGB 12.7 01/31/2019 1015   HCT 44.0 02/24/2021 0745   HCT 38.0 01/31/2019 1015   PLT 241.0 02/24/2021 0745   PLT 603 (H) 01/31/2019 1015   MCV 89.8 02/24/2021 0745   MCV 91 01/31/2019 1015   MCH 30.4 09/07/2020  0850   MCHC 34.1 02/24/2021 0745   RDW 14.8 02/24/2021 0745   RDW 12.5 01/31/2019 1015   Iron Studies    Component Value Date/Time   FERRITIN 58 09/05/2020 0241   FERRITIN 265 (H) 01/31/2019 1015   Lipid Panel     Component Value Date/Time   CHOL 226 (H) 11/13/2023 0833   TRIG 65 11/13/2023 0833   HDL 50 11/13/2023 0833   CHOLHDL 4 02/24/2021 0745   VLDL 27.6 02/24/2021 0745   LDLCALC 165 (H) 11/13/2023 0833   LDLCALC 152 (H) 12/06/2019 0930   Hepatic Function Panel     Component Value Date/Time   PROT 6.5 11/13/2023 0833   ALBUMIN 4.3 11/13/2023 0833   AST 24 11/13/2023 0833    ALT 23 11/13/2023 0833   ALKPHOS 99 11/13/2023 0833   BILITOT 0.3 11/13/2023 0833      Component Value Date/Time   TSH 2.480 11/13/2023 0833   Nutritional Lab Results  Component Value Date   VD25OH 34.7 11/13/2023   VD25OH 50.7 04/18/2023   VD25OH 32.9 09/13/2022     Assessment and Plan Assessment & Plan Obesity Obesity management with a category three plan, adherence at 75%. Recent weight gain of one pound over the last month. Exercise includes walking 20 minutes daily. Challenges include recent comfort eating due to stressors such as poison ivy and an infected toe. - Continue category three obesity management plan - Encourage adherence to dietary recommendations, focusing on protein intake - Encourage continuation and increase of daily walking as tolerated - Discuss maintaining weight through the holiday season  Prediabetes Prediabetes managed with metformin . She is adherent to medication regimen with reminders set for dosing. No reported issues with medication tolerance. - Continue metformin  as prescribed - Continue diet, exercise and weight loss as discussed today as an important part of the treatment plan   Vitamin D  deficiency Vitamin D  deficiency managed with ergocalciferol  50,000 IU weekly. - Continue ergocalciferol  50,000 IU weekly  Emotional eating behaviors Emotional eating behaviors noted, particularly during periods of stress and discomfort such as recent poison ivy and infected toe. She is aware of this and already working on making changes to set herself up for success. - Continue Wellbutrin  - Will continue to monitor  Plantar fasciitis Plantar fasciitis with recent exacerbation possibly affecting gait and causing hip discomfort. She has good arch support but may need additional support. - Encourage plantar fascia stretching exercises - Consider podiatry referral for orthotic evaluation if symptoms persist     Sherry Marquez was counseled on the importance of  maintaining healthy lifestyle habits, including balanced nutrition, regular physical activity, and behavioral modifications, while taking antiobesity medication.  Patient verbalized understanding that medication is an adjunct to, not a replacement for, lifestyle changes and that the long-term success and weight maintenance depend on continued adherence to these strategies.   Sherry Marquez was informed of the importance of frequent follow up visits to maximize her success with intensive lifestyle modifications for her obesity and obesity related health conditions as recommended by USPSTF and CMS guidelines   Louann Penton, MD

## 2024-06-24 ENCOUNTER — Other Ambulatory Visit (INDEPENDENT_AMBULATORY_CARE_PROVIDER_SITE_OTHER): Payer: Self-pay | Admitting: Family Medicine

## 2024-06-24 DIAGNOSIS — F3289 Other specified depressive episodes: Secondary | ICD-10-CM

## 2024-06-27 ENCOUNTER — Ambulatory Visit (INDEPENDENT_AMBULATORY_CARE_PROVIDER_SITE_OTHER): Admitting: Nurse Practitioner

## 2024-07-01 ENCOUNTER — Ambulatory Visit (INDEPENDENT_AMBULATORY_CARE_PROVIDER_SITE_OTHER): Payer: Self-pay | Admitting: Family Medicine

## 2024-07-02 ENCOUNTER — Ambulatory Visit (INDEPENDENT_AMBULATORY_CARE_PROVIDER_SITE_OTHER): Admitting: Family Medicine

## 2024-07-11 ENCOUNTER — Ambulatory Visit: Admitting: Psychology

## 2024-07-11 DIAGNOSIS — F4321 Adjustment disorder with depressed mood: Secondary | ICD-10-CM

## 2024-07-11 NOTE — Progress Notes (Signed)
  Behavioral Health Counselor/Therapist Progress Note  Patient ID: Sherry Marquez, MRN: [id number],    Date: 07/11/2024  Time Spent: 10:00am-10:55am   55 minutes   Treatment Type: Individual Therapy  Reported Symptoms: stress  Mental Status Exam: Appearance:  Casual     Behavior: Appropriate  Motor: Normal  Speech/Language:  Normal Rate  Affect: Appropriate  Mood: normal  Thought process: normal  Thought content:   WNL  Sensory/Perceptual disturbances:   WNL  Orientation: oriented to person, place, time/date, and situation  Attention: Good  Concentration: Good  Memory: Immediate;   Good  Fund of knowledge:  Good  Insight:   Good  Judgment:  Good  Impulse Control: Good   Risk Assessment: Danger to Self:  No Self-injurious Behavior: No Danger to Others: No Duty to Warn:no Physical Aggression / Violence:No  Access to Firearms a concern: No  Gang Involvement:No   Subjective: Pt present for face-to-face individual therapy via video.  Pt consents to telehealth video session and is aware of limitations and benefits of virtual sessions.   Location of pt: home Location of therapist: home office.   Pt talked about her goals from the previous session.  She did get a massage and got her nails done.  She talked to a mother on the soccer team and it went well.  Pt states she did not do any of the tapping for emotional eating.  She is frustrated that she did not achieve that goal.  Pt is still struggling with her eating but is keeping her weight stable.   Pt has worked on her agricultural engineer.   Pt talked about Thanksgiving.  She was at her parents' house and states it felt weird with extended family.  Addressed the family dynamics.  Pt's mother is having health issues that pt is concerned about.   Pt talked about her husband.  They have had some arguments about the holidays.  Addressed their interactions and how they impacted pt.  Pt tends to be conflict avoidant.   Worked with pt on how she can communicate effectively with her husband.  She often feels controlled by him bc of his fears and anxieties.  Pt wants to work on connecting with people and plans to join a walking group.   Pt set some behavioral goals for next session.  Pt wants to sign up for an art class.   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  Pt wants to go to the gym.   Worked on self care strategies.   Provided supportive therapy.    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   Pt participated in setting treatment goals.   Plan to meet monthly.   Pt agrees with treatment plan.  Treatment Plan(treatment plan target date 07/20/2024) Client Abilities/Strengths  Pt is bright, engaging, and motivated for therapy.   Client Treatment Preferences  Individual therapy.  Client Statement of Needs  Improve coping skills.  Symptoms  Depressed or irritable mood. Low self-esteem. Unresolved grief issues.   Problems Addressed  Unipolar Depression Goals 1. Alleviate depressive symptoms and return to previous level of effective functioning. 2. Appropriately grieve the loss in order to normalize mood and to return to previously adaptive level of functioning. Objective Learn and implement behavioral strategies to overcome depression. Target Date: 2024-07-20 Frequency: monthly  Progress: 50 Modality: individual  Related Interventions Engage the client in behavioral activation, increasing his/her  activity level and contact with sources of reward, while identifying processes that inhibit activation.  Use behavioral techniques such as instruction, rehearsal, role-playing, role reversal, as needed, to facilitate activity in the client's daily life; reinforce success. Assist the client in developing skills that increase the likelihood of deriving pleasure from behavioral activation (e.g., assertiveness  skills, developing an exercise plan, less internal/more external focus, increased social involvement); reinforce success. Objective Identify important people in life, past and present, and describe the quality, good and poor, of those relationships. Target Date: 2024-07-20 Frequency: monthly  Progress: 50 Modality: individual  Related Interventions Conduct Interpersonal Therapy beginning with the assessment of the client's interpersonal inventory of important past and present relationships; develop a case formulation linking depression to grief, interpersonal role disputes, role transitions, and/or interpersonal deficits). Objective Learn and implement problem-solving and decision-making skills. Target Date: 2024-07-20 Frequency: monthly  Progress: 50 Modality: individual  Related Interventions Conduct Problem-Solving Therapy using techniques such as psychoeducation, modeling, and role-playing to teach client problem-solving skills (i.e., defining a problem specifically, generating possible solutions, evaluating the pros and cons of each solution, selecting and implementing a plan of action, evaluating the efficacy of the plan, accepting or revising the plan); role-play application of the problem-solving skill to a real life issue. Encourage in the client the development of a positive problem orientation in which problems and solving them are viewed as a natural part of life and not something to be feared, despaired, or avoided. 3. Develop healthy interpersonal relationships that lead to the alleviation and help prevent the relapse of depression. 4. Develop healthy thinking patterns and beliefs about self, others, and the world that lead to the alleviation and help prevent the relapse of depression. 5. Recognize, accept, and cope with feelings of depression. Diagnosis F43.21 Conditions For Discharge Achievement of treatment goals and objectives   [name], LCSW

## 2024-08-05 ENCOUNTER — Ambulatory Visit (INDEPENDENT_AMBULATORY_CARE_PROVIDER_SITE_OTHER): Payer: Self-pay | Admitting: Family Medicine

## 2024-08-05 ENCOUNTER — Encounter (INDEPENDENT_AMBULATORY_CARE_PROVIDER_SITE_OTHER): Payer: Self-pay | Admitting: Family Medicine

## 2024-08-05 VITALS — BP 136/80 | HR 69 | Temp 98.1°F | Ht 66.0 in | Wt 217.0 lb

## 2024-08-05 DIAGNOSIS — R7303 Prediabetes: Secondary | ICD-10-CM

## 2024-08-05 DIAGNOSIS — F5089 Other specified eating disorder: Secondary | ICD-10-CM | POA: Diagnosis not present

## 2024-08-05 DIAGNOSIS — E669 Obesity, unspecified: Secondary | ICD-10-CM

## 2024-08-05 DIAGNOSIS — Z6835 Body mass index (BMI) 35.0-35.9, adult: Secondary | ICD-10-CM

## 2024-08-05 DIAGNOSIS — E559 Vitamin D deficiency, unspecified: Secondary | ICD-10-CM

## 2024-08-05 DIAGNOSIS — E782 Mixed hyperlipidemia: Secondary | ICD-10-CM

## 2024-08-05 DIAGNOSIS — F3289 Other specified depressive episodes: Secondary | ICD-10-CM

## 2024-08-05 MED ORDER — VITAMIN D (ERGOCALCIFEROL) 1.25 MG (50000 UNIT) PO CAPS: 50000.0000 [IU] | ORAL_CAPSULE | ORAL | 0 refills | Status: AC

## 2024-08-05 MED ORDER — BUPROPION HCL ER (SR) 200 MG PO TB12: 200.0000 mg | ORAL_TABLET | Freq: Two times a day (BID) | ORAL | 0 refills | Status: AC

## 2024-08-05 MED ORDER — METFORMIN HCL 500 MG PO TABS: 500.0000 mg | ORAL_TABLET | Freq: Two times a day (BID) | ORAL | 0 refills | Status: AC

## 2024-08-05 NOTE — Progress Notes (Signed)
 "  Office: [phone]  /  Fax: [phone]  WEIGHT SUMMARY AND BIOMETRICS  Anthropometric Measurements Height: 5' 6 (1.676 m) Weight: 217 lb (98.4 kg) BMI (Calculated): 35.04 Weight at Last Visit: 213lb Weight Lost Since Last Visit: 0lb Weight Gained Since Last Visit: 4lb Starting Weight: 232lb Total Weight Loss (lbs): 15 lb (6.804 kg) Peak Weight: 232lb   Body Composition  Body Fat %: 40.4 % Fat Mass (lbs): 87.6 lbs Muscle Mass (lbs): 122.8 lbs Total Body Water (lbs): 83 lbs Visceral Fat Rating : 11   Other Clinical Data RMR: [id number] Fasting: no Labs: no Today's Visit #: 45 Starting Date: 04/28/21    Chief Complaint: OBESITY    History of Present Illness Sherry Marquez is a 53 year old female with obesity who presents for obesity treatment and progress assessment.  She has been adhering to a category three eating plan approximately forty percent of the time over the past two months. Despite this effort, she has experienced a weight gain of four pounds, which she attributes to the holiday season, including Halloween, Thanksgiving, and Christmas. She notes that this weight gain is less than the average [place] during this period. She engages in physical activity by walking for exercise seven days a week, thirty minutes each time.  In addition to obesity, she is being treated for prediabetes, vitamin D  deficiency, and emotional eating behaviors. Her current medications include Wellbutrin  SR 200 mg twice daily for emotional eating behaviors, metformin  500 mg twice daily for prediabetes, and ergocalciferol  50,000 IU weekly for vitamin D  deficiency. Her most recent laboratory tests were conducted in April of this year, and she is due for a repeat.  She also has hyperlipidemia and is working on improving her cholesterol levels with diet, xercise and weight loss.  She mentions joining a new gym, [employer], and plans to start attending soon. She is focused on increasing her  water intake and protein consumption to aid in weight management.      PHYSICAL EXAM:  Blood pressure 136/80, pulse 69, temperature 98.1 F (36.7 C), height 5' 6 (1.676 m), weight 217 lb (98.4 kg), last menstrual period 08/22/2021, SpO2 98%. Body mass index is 35.02 kg/m.  DIAGNOSTIC DATA REVIEWED:  BMET    Component Value Date/Time   NA 139 11/13/2023 0833   K 5.0 11/13/2023 0833   CL 103 11/13/2023 0833   CO2 22 11/13/2023 0833   GLUCOSE 96 11/13/2023 0833   GLUCOSE 105 (H) 02/24/2021 0745   BUN 26 (H) 11/13/2023 0833   CREATININE 0.90 11/13/2023 0833   CREATININE 1.03 12/06/2019 0930   CALCIUM 9.3 11/13/2023 0833   GFRNONAA >60 09/02/2020 1309   Lab Results  Component Value Date   HGBA1C 5.8 (H) 11/13/2023   HGBA1C 5.7 10/29/2013   Lab Results  Component Value Date   INSULIN  6.5 11/13/2023   INSULIN  12.2 04/28/2021   Lab Results  Component Value Date   TSH 2.480 11/13/2023   CBC    Component Value Date/Time   WBC 4.6 02/24/2021 0745   RBC 4.89 02/24/2021 0745   HGB 15.0 02/24/2021 0745   HGB 12.7 01/31/2019 1015   HCT 44.0 02/24/2021 0745   HCT 38.0 01/31/2019 1015   PLT 241.0 02/24/2021 0745   PLT 603 (H) 01/31/2019 1015   MCV 89.8 02/24/2021 0745   MCV 91 01/31/2019 1015   MCH 30.4 09/07/2020 0850   MCHC 34.1 02/24/2021 0745   RDW 14.8 02/24/2021 0745   RDW 12.5 01/31/2019  1015   Iron Studies    Component Value Date/Time   FERRITIN 58 09/05/2020 0241   FERRITIN 265 (H) 01/31/2019 1015   Lipid Panel     Component Value Date/Time   CHOL 226 (H) 11/13/2023 0833   TRIG 65 11/13/2023 0833   HDL 50 11/13/2023 0833   CHOLHDL 4 02/24/2021 0745   VLDL 27.6 02/24/2021 0745   LDLCALC 165 (H) 11/13/2023 0833   LDLCALC 152 (H) 12/06/2019 0930   Hepatic Function Panel     Component Value Date/Time   PROT 6.5 11/13/2023 0833   ALBUMIN 4.3 11/13/2023 0833   AST 24 11/13/2023 0833   ALT 23 11/13/2023 0833   ALKPHOS 99 11/13/2023 0833   BILITOT  0.3 11/13/2023 0833      Component Value Date/Time   TSH 2.480 11/13/2023 0833   Nutritional Lab Results  Component Value Date   VD25OH 34.7 11/13/2023   VD25OH 50.7 04/18/2023   VD25OH 32.9 09/13/2022     Assessment and Plan Assessment & Plan Obesity Management is ongoing with a category three eating plan, followed 40% of the time over the last two months. Weight gain of four pounds noted, less than the average [place] during the same period. Engages in regular physical activity, walking seven days a week for 30 minutes. Joined a new gym, [employer], but has not yet attended. Discussed strategies to manage sugar cravings and maintain a healthy diet post-holidays. - Continue category three eating plan - Encouraged regular physical activity, including walking and gym attendance - Discussed strategies to manage sugar cravings and maintain a healthy diet post-holidays  Prediabetes Managed with metformin  500 mg twice a day. Labs are due for repetition, but will be delayed until next month to allow recovery from recent holiday indulgences. - Continue metformin  500 mg twice a day - Will repeat labs next month - Continue diet, exercise and weight loss as discussed today as an important part of the treatment plan   Mixed hyperlipidemia Managed through diet, exercise, and weight loss. Not currently on a statin. - Continue diet, exercise, and weight loss efforts  Vitamin D  deficiency Managed with ergocalciferol  50,000 IU weekly. - Continue ergocalciferol  50,000 IU weekly  Emotional eating behaviors Managed with Wellbutrin  SR 200 mg twice a day. No reported issues with medication. - Continue Wellbutrin  SR 200 mg twice a day      Patients who are on anti-obesity medications are counseled on the importance of maintaining healthy lifestyle habits, including balanced nutrition, regular physical activity, and behavioral modifications,  Medication is an adjunct to, not a replacement for,  lifestyle changes and that the long-term success and weight maintenance depend on continued adherence to these strategies.   Sherry Marquez was informed of the importance of frequent follow up visits to maximize her success with intensive lifestyle modifications for her obesity and obesity related health conditions as recommended by USPSTF and CMS guidelines  Louann Penton, MD   "

## 2024-08-20 ENCOUNTER — Other Ambulatory Visit (INDEPENDENT_AMBULATORY_CARE_PROVIDER_SITE_OTHER): Payer: Self-pay | Admitting: Family Medicine

## 2024-08-20 DIAGNOSIS — R7303 Prediabetes: Secondary | ICD-10-CM

## 2024-08-23 ENCOUNTER — Other Ambulatory Visit (INDEPENDENT_AMBULATORY_CARE_PROVIDER_SITE_OTHER): Payer: Self-pay | Admitting: Family Medicine

## 2024-08-23 DIAGNOSIS — R7303 Prediabetes: Secondary | ICD-10-CM

## 2024-09-04 ENCOUNTER — Encounter (INDEPENDENT_AMBULATORY_CARE_PROVIDER_SITE_OTHER): Payer: Self-pay | Admitting: Family Medicine

## 2024-09-04 ENCOUNTER — Ambulatory Visit (INDEPENDENT_AMBULATORY_CARE_PROVIDER_SITE_OTHER): Admitting: Family Medicine

## 2024-09-04 VITALS — BP 155/85 | HR 64 | Temp 98.1°F | Ht 66.0 in | Wt 216.0 lb

## 2024-09-04 DIAGNOSIS — E559 Vitamin D deficiency, unspecified: Secondary | ICD-10-CM

## 2024-09-04 DIAGNOSIS — E782 Mixed hyperlipidemia: Secondary | ICD-10-CM | POA: Diagnosis not present

## 2024-09-04 DIAGNOSIS — E669 Obesity, unspecified: Secondary | ICD-10-CM | POA: Diagnosis not present

## 2024-09-04 DIAGNOSIS — R030 Elevated blood-pressure reading, without diagnosis of hypertension: Secondary | ICD-10-CM | POA: Diagnosis not present

## 2024-09-04 DIAGNOSIS — Z6834 Body mass index (BMI) 34.0-34.9, adult: Secondary | ICD-10-CM

## 2024-09-04 DIAGNOSIS — R7303 Prediabetes: Secondary | ICD-10-CM | POA: Diagnosis not present

## 2024-09-04 NOTE — Progress Notes (Signed)
 "  Office: [phone]  /  Fax: [phone]  WEIGHT SUMMARY AND BIOMETRICS  Anthropometric Measurements Height: 5' 6 (1.676 m) Weight: 216 lb (98 kg) BMI (Calculated): 34.88 Weight at Last Visit: 217 lb Weight Lost Since Last Visit: 1 lb Weight Gained Since Last Visit: 0 Starting Weight: 232 lb Total Weight Loss (lbs): 16 lb (7.258 kg) Peak Weight: 232 lb   Body Composition  Body Fat %: 40.3 % Fat Mass (lbs): 87.2 lbs Muscle Mass (lbs): 122.4 lbs Total Body Water (lbs): 82.6 lbs Visceral Fat Rating : 11   Other Clinical Data RMR: 1699 Fasting: yes Labs: no Today's Visit #: 46 Starting Date: 04/28/21    Chief Complaint: OBESITY  History of Present Illness Sherry Marquez is a 54 year old female who presents for a follow-up on her treatment plan and progress.  She has been following the category three eating plan approximately seventy percent of the time, focusing on consuming the recommended amount of protein and increasing her intake of fruits and vegetables. She ensures not to skip meals. Despite these efforts, she has lost only one pound over the past month.  She engages in physical activity by walking one mile daily and performing strengthening exercises for about thirty minutes each day, seven days a week. Despite these efforts, her weight loss has been minimal.  Her blood pressure readings today were elevated at 154/87 and 155/85. She does not have a history of hypertension but has experienced occasional elevated readings in the past, which were followed by normal readings. She feels that today's elevated readings may be due to stress from driving on icy roads.  She feels that she is retaining water this week, which she believes may have affected her weight loss efforts. She experiences occasional hot flashes, more frequent in the summer, and notes that her house is kept at a warm temperature, which may contribute to her discomfort. Her mother experienced  significant hot flashes during menopause.      PHYSICAL EXAM:  Blood pressure (!) 155/85, pulse 64, temperature 98.1 F (36.7 C), height 5' 6 (1.676 m), weight 216 lb (98 kg), last menstrual period 08/22/2021, SpO2 97%. Body mass index is 34.86 kg/m.  DIAGNOSTIC DATA REVIEWED BY MYSELF TODAY:  BMET    Component Value Date/Time   NA 139 11/13/2023 0833   K 5.0 11/13/2023 0833   CL 103 11/13/2023 0833   CO2 22 11/13/2023 0833   GLUCOSE 96 11/13/2023 0833   GLUCOSE 105 (H) 02/24/2021 0745   BUN 26 (H) 11/13/2023 0833   CREATININE 0.90 11/13/2023 0833   CREATININE 1.03 12/06/2019 0930   CALCIUM 9.3 11/13/2023 0833   GFRNONAA >60 09/02/2020 1309   Lab Results  Component Value Date   HGBA1C 5.8 (H) 11/13/2023   HGBA1C 5.7 10/29/2013   Lab Results  Component Value Date   INSULIN  6.5 11/13/2023   INSULIN  12.2 04/28/2021   Lab Results  Component Value Date   TSH 2.480 11/13/2023   CBC    Component Value Date/Time   WBC 4.6 02/24/2021 0745   RBC 4.89 02/24/2021 0745   HGB 15.0 02/24/2021 0745   HGB 12.7 01/31/2019 1015   HCT 44.0 02/24/2021 0745   HCT 38.0 01/31/2019 1015   PLT 241.0 02/24/2021 0745   PLT 603 (H) 01/31/2019 1015   MCV 89.8 02/24/2021 0745   MCV 91 01/31/2019 1015   MCH 30.4 09/07/2020 0850   MCHC 34.1 02/24/2021 0745   RDW 14.8 02/24/2021 0745  RDW 12.5 01/31/2019 1015   Iron Studies    Component Value Date/Time   FERRITIN 58 09/05/2020 0241   FERRITIN 265 (H) 01/31/2019 1015   Lipid Panel     Component Value Date/Time   CHOL 226 (H) 11/13/2023 0833   TRIG 65 11/13/2023 0833   HDL 50 11/13/2023 0833   CHOLHDL 4 02/24/2021 0745   VLDL 27.6 02/24/2021 0745   LDLCALC 165 (H) 11/13/2023 0833   LDLCALC 152 (H) 12/06/2019 0930   Hepatic Function Panel     Component Value Date/Time   PROT 6.5 11/13/2023 0833   ALBUMIN 4.3 11/13/2023 0833   AST 24 11/13/2023 0833   ALT 23 11/13/2023 0833   ALKPHOS 99 11/13/2023 0833   BILITOT 0.3  11/13/2023 0833      Component Value Date/Time   TSH 2.480 11/13/2023 0833   Nutritional Lab Results  Component Value Date   VD25OH 34.7 11/13/2023   VD25OH 50.7 04/18/2023   VD25OH 32.9 09/13/2022     Assessment and Plan Assessment & Plan Obesity Management is ongoing with a category three eating plan, followed 70% of the time. She reports increased protein, fruits, and vegetables intake, and no meal skipping. Engages in daily walking and strengthening exercises. Weight loss of one pound over the last month. Discussed intermittent fasting, noting potential benefits for reducing inflammation and insulin  resistance, but cautioned against prolonged fasting due to potential metabolic slowdown. Emphasized the importance of maintaining adequate nutrition during fasting periods. - Continue category three eating plan - Encouraged daily walking and strengthening exercises - Consider intermittent fasting with adequate nutrition, especially lean protein  Elevated blood pressure without hypertension Blood pressure readings today were elevated at 154/87 and 155/85, attributed to stress from driving in icy conditions. No history of hypertension, though occasional elevations have been noted. Diagnosis is elevated blood pressure without hypertension. - Ordered thyroid function tests to rule out thyroid-related causes of elevated blood pressure  Vitamin D  deficiency Vitamin D  levels to be assessed as part of routine evaluation. - Ordered vitamin D  level test  Prediabetes Monitoring with A1c and insulin  levels to be assessed. Discussed the potential benefits of intermittent fasting in reducing insulin  resistance. - Ordered A1c and insulin  level tests - Continue diet, exercise and weight loss as discussed today as an important part of the treatment plan   Mixed hyperlipidemia Cholesterol panel to be assessed to monitor lipid levels. - Ordered cholesterol panel - Continue diet, exercise and  weight loss as discussed today as an important part of the treatment plan       Patients who are on anti-obesity medications are counseled on the importance of maintaining healthy lifestyle habits, including balanced nutrition, regular physical activity, and behavioral modifications,  Medication is an adjunct to, not a replacement for, lifestyle changes and that the long-term success and weight maintenance depend on continued adherence to these strategies.   Sherry Marquez was informed of the importance of frequent follow up visits to maximize her success with intensive lifestyle modifications for her obesity and obesity related health conditions as recommended by USPSTF and CMS guidelines  Louann Penton, MD   "

## 2024-09-05 LAB — CMP14+EGFR
ALT: 24 [IU]/L (ref 0–32)
AST: 19 [IU]/L (ref 0–40)
Albumin: 4.5 g/dL (ref 3.8–4.9)
Alkaline Phosphatase: 108 [IU]/L (ref 49–135)
BUN/Creatinine Ratio: 22 (ref 9–23)
BUN: 20 mg/dL (ref 6–24)
Bilirubin Total: 0.4 mg/dL (ref 0.0–1.2)
CO2: 22 mmol/L (ref 20–29)
Calcium: 9.8 mg/dL (ref 8.7–10.2)
Chloride: 102 mmol/L (ref 96–106)
Creatinine, Ser: 0.93 mg/dL (ref 0.57–1.00)
Globulin, Total: 2.3 g/dL (ref 1.5–4.5)
Glucose: 101 mg/dL — ABNORMAL HIGH (ref 70–99)
Potassium: 4.8 mmol/L (ref 3.5–5.2)
Sodium: 140 mmol/L (ref 134–144)
Total Protein: 6.8 g/dL (ref 6.0–8.5)
eGFR: 74 mL/min/{1.73_m2}

## 2024-09-05 LAB — LIPID PANEL WITH LDL/HDL RATIO
Cholesterol, Total: 252 mg/dL — ABNORMAL HIGH (ref 100–199)
HDL: 59 mg/dL
LDL Chol Calc (NIH): 173 mg/dL — ABNORMAL HIGH (ref 0–99)
LDL/HDL Ratio: 2.9 ratio (ref 0.0–3.2)
Triglycerides: 112 mg/dL (ref 0–149)
VLDL Cholesterol Cal: 20 mg/dL (ref 5–40)

## 2024-09-05 LAB — CBC WITH DIFFERENTIAL/PLATELET
Basophils Absolute: 0 10*3/uL (ref 0.0–0.2)
Basos: 1 %
EOS (ABSOLUTE): 0.1 10*3/uL (ref 0.0–0.4)
Eos: 2 %
Hematocrit: 48.3 % — ABNORMAL HIGH (ref 34.0–46.6)
Hemoglobin: 15.8 g/dL (ref 11.1–15.9)
Immature Grans (Abs): 0 10*3/uL (ref 0.0–0.1)
Immature Granulocytes: 0 %
Lymphocytes Absolute: 1.3 10*3/uL (ref 0.7–3.1)
Lymphs: 23 %
MCH: 30.2 pg (ref 26.6–33.0)
MCHC: 32.7 g/dL (ref 31.5–35.7)
MCV: 92 fL (ref 79–97)
Monocytes Absolute: 0.4 10*3/uL (ref 0.1–0.9)
Monocytes: 8 %
Neutrophils Absolute: 3.8 10*3/uL (ref 1.4–7.0)
Neutrophils: 66 %
Platelets: 288 10*3/uL (ref 150–450)
RBC: 5.23 x10E6/uL (ref 3.77–5.28)
RDW: 12.7 % (ref 11.7–15.4)
WBC: 5.6 10*3/uL (ref 3.4–10.8)

## 2024-09-05 LAB — VITAMIN B12: Vitamin B-12: 1064 pg/mL (ref 232–1245)

## 2024-09-05 LAB — INSULIN, RANDOM: INSULIN: 11.4 u[IU]/mL (ref 2.6–24.9)

## 2024-09-05 LAB — VITAMIN D 25 HYDROXY (VIT D DEFICIENCY, FRACTURES): Vit D, 25-Hydroxy: 34.6 ng/mL (ref 30.0–100.0)

## 2024-09-05 LAB — TSH: TSH: 3.4 u[IU]/mL (ref 0.450–4.500)

## 2024-09-05 LAB — HEMOGLOBIN A1C
Est. average glucose Bld gHb Est-mCnc: 117 mg/dL
Hgb A1c MFr Bld: 5.7 % — ABNORMAL HIGH (ref 4.8–5.6)

## 2024-09-12 ENCOUNTER — Encounter: Payer: Self-pay | Admitting: Psychology

## 2024-09-12 ENCOUNTER — Ambulatory Visit: Admitting: Psychology

## 2024-09-12 DIAGNOSIS — F4321 Adjustment disorder with depressed mood: Secondary | ICD-10-CM

## 2024-09-12 NOTE — Progress Notes (Signed)
 Photographer Health Counselor Initial Adult Exam  Name: Sherry Marquez Date: 09/12/2024 MRN: [id number] DOB: 02/26/1971 PCP: Ozell Heron HERO, MD  Time spent: 10:00am-10:50am   50 minutes  Guardian/Payee:  debara Marquez requested: No   Reason for Visit /Presenting Problem: Pt present for face-to-face initial assessment update via video.  Pt consents to telehealth video session and is aware of limitations and benefits of virtual sessions. Location of pt: home Location of therapist: home office.  Pt talked about feeling stuck.   She is not meeting her goals regarding weight loss.  Pt states she is not doing what she needs to do.  She over ate during the holidays and is having trouble getting back on track.  Pt has joined a gym and is going a couple of times a week so she feels good about that.   Pt has some challenges in her relationship with her husband.  She is working on finding her voice in the marriage.   Pt has had significant losses she is still dealing with and has anticipatory grief related to her aging parents.   Pt tends to take on other people's emotions and puts hers on the back burner.   Pt wants to learn to deal with her emotions better.   Reviewed pt's treatment plan for annual update.  Updated pt's treatment plan and IA.  Pt participated in setting treatment goals.  Pt would like to manage stress better and improve coping skills.  Plan to meet monthly.      Mental Status Exam: Appearance:   Casual     Behavior:  Appropriate  Motor:  Normal  Speech/Language:   Normal Rate  Affect:  Appropriate  Mood:  normal  Thought process:  normal  Thought content:    WNL  Sensory/Perceptual disturbances:    WNL  Orientation:  oriented to person, place, time/date, and situation  Attention:  Good  Concentration:  Good  Memory:  WNL  Fund of knowledge:   Good  Insight:    Good  Judgment:   Good  Impulse Control:  Good    Reported Symptoms:  stress,  sadness  Risk Assessment: Danger to Self:  No Self-injurious Behavior: No Danger to Others: No Duty to Warn:no Physical Aggression / Violence:No  Access to Firearms a concern: No  Gang Involvement:No  Patient / guardian was educated about steps to take if suicide or homicide risk level increases between visits: n/a While future psychiatric events cannot be accurately predicted, the patient does not currently require acute inpatient psychiatric care and does not currently meet [facility]  involuntary commitment criteria.  Substance Abuse History: Current substance abuse: No     Past Psychiatric History:   No previous psychological problems have been observed Outpatient Providers:none previously History of Psych Hospitalization: No  Psychological Testing: n/a   Abuse History:  Victim of: No., n/a   Report needed: No. Victim of Neglect:No. Perpetrator of n/a  Witness / Exposure to Domestic Violence: No   Protective Services Involvement: No  Witness to [employer] Violence:  No   Family History:  Family History  Problem Relation Age of Onset   Atrial fibrillation Mother    Hypothyroidism Mother    Hypertension Mother    Heart disease Mother    Sleep apnea Mother    Obesity Mother    Heart disease Father        defibrillator   Hypertension Father    Heart attack Father 87  CAD Father        4-way bypass   High Cholesterol Father    Sleep apnea Father    Obesity Father    Hypertension Brother    Lymphoma Maternal Grandmother 58   Congestive Heart Failure Maternal Grandmother    Stroke Maternal Grandfather 63   Cancer Paternal Grandmother        mets to lung   Heart attack Paternal Grandfather        early death    Living situation: the patient lives with her husband and two sons.    Pt grew up with both parents and an older brother.   Pt states that overall her childhood was good and her parents were supportive.   Pt is not very close to her brother.   Pt  states she is close to her parents.   Family history of mental health issues: Father has anxiety.  Brother is alcoholic but has been sober for 6 years.   No child abuse.    Sexual Orientation: Straight  Relationship Status: married  Name of spouse / other:pt and husband Sherry Marquez have been married since 2003.  If a parent, number of children / ages:pt has two teenage sons.  Support Systems: spouse parents  Financial Stress:  No   Income/Employment/Disability: Employment.  Pt's husband work.   Pt is a stay at home mom.  Military Service: No   Educational History: Education: Risk Manager: Protestant  Any cultural differences that may affect / interfere with treatment:  not applicable   Recreation/Hobbies: gardening  Stressors: Loss of in laws.  Pt has had several losses in the past couple of years.     Strengths: Supportive Relationships, Family, Hopefulness, Self Advocate, and Able to Communicate Effectively  Barriers:  none   Legal History: Pending legal issue / charges: The patient has no significant history of legal issues. History of legal issue / charges: n/a  Medical History/Surgical History: reviewed Past Medical History:  Diagnosis Date   Allergy    Anxiety    Back pain    COVID    Diabetes (HCC)    Gestational   Eczema    Environmental and seasonal allergies    High cholesterol    History of gestational diabetes    Hx of blood clots    Joint pain    Lack of energy    LUMBAR SPRAIN AND STRAIN 12/05/2009   Qualifier: Diagnosis of  By: Johnny MD, Garnette LABOR    Obesity    Osteoarthritis    Pneumonia    Right ovarian cyst    Rosacea    SOB (shortness of breath)     Past Surgical History:  Procedure Laterality Date   CESAREAN SECTION  03-15-2005   dr rockney  @[facility]    LAPAROSCOPY N/A 05/20/2019   Procedure: LAPAROSCOPY DIAGNOSTIC;  Surgeon: Rockney Evalene SQUIBB, MD;  Location: [facility];  Service:  Gynecology;  Laterality: N/A;   WISDOM TOOTH EXTRACTION  2001    Medications: Current Outpatient Medications  Medication Sig Dispense Refill   buPROPion  (WELLBUTRIN  SR) 200 MG 12 hr tablet Take 1 tablet (200 mg total) by mouth 2 (two) times daily. 180 tablet 0   cetirizine (ZYRTEC) 10 MG tablet Take 10 mg by mouth daily.     clobetasol  ointment (TEMOVATE ) 0.05 % Apply 1 Application topically 2 (two) times daily. 30 g 0   COLLAGEN PO Take by mouth. 1 scoop daily  MAGNESIUM PO Take by mouth daily.     meloxicam  (MOBIC ) 15 MG tablet Take 1 tablet (15 mg total) by mouth daily as needed for pain. 30 tablet 0   metFORMIN  (GLUCOPHAGE ) 500 MG tablet Take 1 tablet (500 mg total) by mouth 2 (two) times daily with a meal. 180 tablet 0   POTASSIUM CHLORIDE PO Take 640 mg by mouth.     triamcinolone  cream (KENALOG ) 0.1 % Apply 1 Application topically 2 (two) times daily. Apply thin layer, cover with a thick hypoallergenic or non fragrant cream. 30 g 0   Vitamin D , Ergocalciferol , (DRISDOL ) 1.25 MG (50000 UNIT) CAPS capsule Take 1 capsule (50,000 Units total) by mouth every 7 (seven) days. 15 capsule 0   No current facility-administered medications for this visit.    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  Pt participated in setting treatment goals.  Pt wants to work on motivation to meet her health and nutrition goals.   She wants to improve coping skills.  Plan to meet monthly.  Pt agrees with treatment plan.    Behavioral Health Treatment Plan   Name:Floriene MOZELLE REMLINGER    MRN: [id number]   Treatment Plan Development Date: 09/12/2024  Strengths: Supportive Relationships, Family, and Able to Communicate Effectively  Supports: Spouse  Client Statement of Needs: Pt wants to improve coping skills.   Pt wants to work  on motivation to meet her health and nutrition goals.  Treatment Level:outpatient individual therapy.  Client Treatment Preferences:individual therapy / virtual  Diagnosis: Adjustment Disorder  F43.21  Symptoms:  The development of emotional or behavioral symptoms in response to an identifiable stressor(s) occurring within 3 months of the onset of the stressor(s).  Goals:  Reduce feelings of sadness and hopelessness thereby improving mood. and Improve coping skills by learning effective ways to adapt and manage anxiety.  Objectives: Target Date For All Objectives: 09/12/2025  Enhance problem-solving abilities and develop skills to address and resolve challenges., Promote positive behavior change and encourage healthy and productive activities., and Interrupt negative thinking and increase positive thoughts and feelings  Progress Documentation:   Progressing  Interventions:  Cognitive Behavioral Therapy and Insight-Oriented  Expected duration of treatment: a year  Party responsible for implementation of interventions: Anjolaoluwa Siguenza, LCSW.   This plan has been reviewed and created by the following participants: Jendayi Berling, LCSW   A new plan will be created at least every 12 months.  The patient fully participated in the development of treatment plan with the clinician and verbally consents to such treatment.   Patient Treatment Plan Signature Obtained: No, pending signature via MyChart.   Anesa Fronek, LCSW    "

## 2024-09-12 NOTE — Progress Notes (Signed)
 Sherry Marquez is a 54 y.o. female patient DOB 11-18-1970.  Date: 09/12/2024   Plan of Care: Recommend ongoing therapy.  Pt participated in setting treatment goals.  Pt wants to work on motivation to meet her health and nutrition goals.   She wants to improve coping skills.  Plan to meet monthly.  Pt agrees with treatment plan.    Behavioral Health Treatment Plan   Name:Sherry Marquez    MRN: [id number]   Treatment Plan Development Date: 09/12/2024  Strengths: Supportive Relationships, Family, and Able to Communicate Effectively  Supports: Spouse  Client Statement of Needs: Pt wants to improve coping skills.   Pt wants to work on motivation to meet her health and nutrition goals.  Treatment Level:outpatient individual therapy.  Client Treatment Preferences:individual therapy / virtual  Diagnosis: Adjustment Disorder  F43.21  Symptoms:  The development of emotional or behavioral symptoms in response to an identifiable stressor(s) occurring within 3 months of the onset of the stressor(s).  Goals:  Reduce feelings of sadness and hopelessness thereby improving mood. and Improve coping skills by learning effective ways to adapt and manage anxiety.  Objectives: Target Date For All Objectives: 09/12/2025  Enhance problem-solving abilities and develop skills to address and resolve challenges., Promote positive behavior change and encourage healthy and productive activities., and Interrupt negative thinking and increase positive thoughts and feelings  Progress Documentation:   Progressing  Interventions:  Cognitive Behavioral Therapy and Insight-Oriented  Expected duration of treatment: a year  Party responsible for implementation of interventions: Anntionette Madkins, LCSW.   This plan has been reviewed and created by the following participants: Suhaan Perleberg, LCSW   A new plan will be created at least every 12 months.  The patient fully participated in the development of  treatment plan with the clinician and verbally consents to such treatment.   Patient Treatment Plan Signature Obtained: No, pending signature via MyChart.   Jacquelynn Friend, LCSW

## 2024-10-02 ENCOUNTER — Ambulatory Visit (INDEPENDENT_AMBULATORY_CARE_PROVIDER_SITE_OTHER): Admitting: Family Medicine

## 2024-10-30 ENCOUNTER — Ambulatory Visit (INDEPENDENT_AMBULATORY_CARE_PROVIDER_SITE_OTHER): Admitting: Family Medicine

## 2024-11-07 ENCOUNTER — Ambulatory Visit: Admitting: Psychology
